# Patient Record
Sex: Female | Born: 1960
Health system: Southern US, Community
[De-identification: ages and names within clinical notes are randomized; demographics above are authoritative.]

## PROBLEM LIST (undated history)

## (undated) DIAGNOSIS — I4891 Unspecified atrial fibrillation: Secondary | ICD-10-CM

## (undated) DIAGNOSIS — M26609 Unspecified temporomandibular joint disorder, unspecified side: Secondary | ICD-10-CM

## (undated) DIAGNOSIS — A048 Other specified bacterial intestinal infections: Secondary | ICD-10-CM

## (undated) DIAGNOSIS — D649 Anemia, unspecified: Secondary | ICD-10-CM

## (undated) DIAGNOSIS — IMO0001 Reserved for inherently not codable concepts without codable children: Secondary | ICD-10-CM

## (undated) DIAGNOSIS — R87619 Unspecified abnormal cytological findings in specimens from cervix uteri: Secondary | ICD-10-CM

## (undated) DIAGNOSIS — L409 Psoriasis, unspecified: Secondary | ICD-10-CM

## (undated) DIAGNOSIS — N939 Abnormal uterine and vaginal bleeding, unspecified: Secondary | ICD-10-CM

## (undated) DIAGNOSIS — K219 Gastro-esophageal reflux disease without esophagitis: Secondary | ICD-10-CM

## (undated) DIAGNOSIS — R011 Cardiac murmur, unspecified: Secondary | ICD-10-CM

## (undated) DIAGNOSIS — T7840XA Allergy, unspecified, initial encounter: Secondary | ICD-10-CM

## (undated) DIAGNOSIS — E119 Type 2 diabetes mellitus without complications: Secondary | ICD-10-CM

## (undated) DIAGNOSIS — H539 Unspecified visual disturbance: Secondary | ICD-10-CM

## (undated) DIAGNOSIS — G4730 Sleep apnea, unspecified: Secondary | ICD-10-CM

## (undated) DIAGNOSIS — D219 Benign neoplasm of connective and other soft tissue, unspecified: Secondary | ICD-10-CM

## (undated) DIAGNOSIS — M1990 Unspecified osteoarthritis, unspecified site: Secondary | ICD-10-CM

## (undated) HISTORY — DX: Unspecified temporomandibular joint disorder, unspecified side: M26.609

## (undated) HISTORY — DX: Abnormal uterine and vaginal bleeding, unspecified: N93.9

## (undated) HISTORY — DX: Unspecified abnormal cytological findings in specimens from cervix uteri: R87.619

## (undated) HISTORY — DX: Anemia, unspecified: D64.9

## (undated) HISTORY — DX: Gastro-esophageal reflux disease without esophagitis: K21.9

## (undated) HISTORY — PX: OTHER SURGICAL HISTORY: SHX169

## (undated) HISTORY — DX: Psoriasis, unspecified: L40.9

## (undated) HISTORY — DX: Other specified bacterial intestinal infections: A04.8

## (undated) HISTORY — DX: Benign neoplasm of connective and other soft tissue, unspecified: D21.9

## (undated) HISTORY — PX: DILATION AND CURETTAGE OF UTERUS: SHX78

## (undated) HISTORY — DX: Allergy, unspecified, initial encounter: T78.40XA

## (undated) HISTORY — DX: Unspecified osteoarthritis, unspecified site: M19.90

## (undated) HISTORY — DX: Reserved for inherently not codable concepts without codable children: IMO0001

## (undated) HISTORY — DX: Sleep apnea, unspecified: G47.30

## (undated) HISTORY — DX: Type 2 diabetes mellitus without complications: E11.9

## (undated) HISTORY — PX: LEEP: SHX91

## (undated) HISTORY — DX: Cardiac murmur, unspecified: R01.1

## (undated) HISTORY — DX: Unspecified visual disturbance: H53.9

---

## 1987-03-09 HISTORY — PX: DILATION AND CURETTAGE, DIAGNOSTIC / THERAPEUTIC: SUR384

## 1992-03-08 DIAGNOSIS — A64 Unspecified sexually transmitted disease: Secondary | ICD-10-CM

## 1992-03-08 HISTORY — DX: Unspecified sexually transmitted disease: A64

## 1999-04-02 ENCOUNTER — Encounter: Payer: Self-pay | Admitting: Family Medicine

## 1999-04-02 ENCOUNTER — Ambulatory Visit (HOSPITAL_COMMUNITY): Admission: RE | Admit: 1999-04-02 | Discharge: 1999-04-02 | Payer: Self-pay | Admitting: Family Medicine

## 1999-08-06 ENCOUNTER — Other Ambulatory Visit: Admission: RE | Admit: 1999-08-06 | Discharge: 1999-08-06 | Payer: Self-pay | Admitting: Obstetrics & Gynecology

## 2000-12-29 ENCOUNTER — Encounter: Admission: RE | Admit: 2000-12-29 | Discharge: 2001-01-19 | Payer: Self-pay | Admitting: Family Medicine

## 2002-05-10 ENCOUNTER — Encounter: Payer: Self-pay | Admitting: Family Medicine

## 2002-05-10 ENCOUNTER — Encounter: Admission: RE | Admit: 2002-05-10 | Discharge: 2002-05-10 | Payer: Self-pay | Admitting: Family Medicine

## 2002-07-05 ENCOUNTER — Other Ambulatory Visit: Admission: RE | Admit: 2002-07-05 | Discharge: 2002-07-05 | Payer: Self-pay | Admitting: Otolaryngology

## 2002-07-12 ENCOUNTER — Encounter (HOSPITAL_COMMUNITY): Admission: RE | Admit: 2002-07-12 | Discharge: 2002-08-11 | Payer: Self-pay | Admitting: Otolaryngology

## 2004-08-20 ENCOUNTER — Ambulatory Visit (HOSPITAL_COMMUNITY): Admission: RE | Admit: 2004-08-20 | Discharge: 2004-08-20 | Payer: Self-pay | Admitting: Family Medicine

## 2006-10-20 ENCOUNTER — Ambulatory Visit (HOSPITAL_COMMUNITY): Admission: RE | Admit: 2006-10-20 | Discharge: 2006-10-20 | Payer: Self-pay | Admitting: Family Medicine

## 2009-01-14 ENCOUNTER — Ambulatory Visit (HOSPITAL_COMMUNITY): Admission: RE | Admit: 2009-01-14 | Discharge: 2009-01-14 | Payer: Self-pay | Admitting: Family Medicine

## 2010-05-16 LAB — HEPATIC FUNCTION PANEL: Bilirubin, Total: 0.4 mg/dL

## 2010-05-16 LAB — CBC AND DIFFERENTIAL
HCT: 35 % — AB (ref 36–46)
Hemoglobin: 11.2 g/dL — AB (ref 12.0–16.0)

## 2010-05-16 LAB — BASIC METABOLIC PANEL
Creatinine: 0.7 mg/dL (ref 0.5–1.1)
Glucose: 122 mg/dL
Potassium: 3.9 mmol/L (ref 3.4–5.3)
Sodium: 138 mmol/L (ref 137–147)

## 2010-05-18 DIAGNOSIS — R072 Precordial pain: Secondary | ICD-10-CM

## 2010-07-24 NOTE — Consult Note (Signed)
NAME:  Maria Winters, Maria Winters                       ACCOUNT NO.:  [id number]   MEDICAL RECORD NO.:  [id number]                   PATIENT TYPE:   LOCATION:                                       FACILITY:   PHYSICIAN:  Aundra Dubin, M.D.            DATE OF BIRTH:  09-Jul-1960   DATE OF CONSULTATION:  DATE OF DISCHARGE:                                   CONSULTATION   REFERRING PHYSICIAN:  Suzanna Obey, M.D.   CHIEF COMPLAINT:  Nasal perforation, positive ANA.   Dear Jonny Ruiz,   Thank you for this consultation.  Maria Winters is a 50 year old black female  who began having a sinus type of infection in December 2003.  There was no  fever associated with this.  She was having bleeding from her nose and would  blow her nose into a tissue paper but would also cough some up.  She was not  sure if some of the blood that was coughed up was from the lungs or now.  She has had a chest x-ray over the spring 2004 and she tells me it was  normal.   In December she was treated with a 10-day course of antibiotics. She was  having significant headaches, almost daily concerning this.  She later  required a 30-day course of antibiotics, but she still had terrible  headaches along with some of the hemoptysis and epistaxis.  For about 6  weeks from March into mid-April she used Nasacort. She finally developed a  perforation of the nasal septum in about mid-April.  She is not having the  bleeding from the nose, at this time, and her headaches have lessened.   Labs on 05/24/2002 showed an ANA 1:40, homogeneous, and an ESR was 60.  WBC  was 8.4, HGB 12.2 and platelets 442 (150-400).  She has now had a nasoseptal  biopsy which showed necrotic mucosa and inflammatory changes, but no  granulomas.   On further review of systems she has some generalized aching, and this is  worse to the low back particularly with standing.  There has been no swollen  joints.  There has been no weight loss.  There has been  moderate malaise  because she has not felt well for several months.  Her energy level is low,  but she describes this as being present for several years.  There have  been no active rashes, psoriasis, photosensitivity, oral ulcers, alopecia,  pleurisy, or Raynaud's.  She denies diarrhea, constipation, blood or mucus  to the BM.  There has been no shortness of breath or chest pain throughout  this process. She denies numbness, tingling or weakness to her muscles in  extremities.   PAST MEDICAL/SURGICAL HISTORY:  Pneumonia at age 52, D&C 53, ovarian  procedure 70.   MEDICATIONS:  1. Allegra 180 mg daily.  2. Advil a few pills per month.   DRUG INTOLERANCES:  NEXIUM--tremor.   FAMILY  HISTORY:  Her father is 79 years of age and has had recent surgery to  his back, but is in general good health.  He has HTN.  Her mother died at  age 4 from head trauma.  She had diabetes. She has had a brother to die in  an MVA.   SOCIAL HISTORY:  She has lived most of her life in [place], [place]. She is an  Geologist, engineering for [employer].  She completed 2 years of  training at [employer] and also 2 years of training at [employer] [facility].  She smoked about one-  third of a pack of cigarettes a day, stopping in 1995.  She has a glass of  wine about once per month.  She is single and lives alone. She does not have  children.   PHYSICAL EXAMINATION:  VITAL SIGNS:  Weight 269 pounds.  Height 5 feet 6  inches.  Blood pressure 130/70, respirations 16.  GENERAL:  She is significantly overweight but is in no distress.  SKIN:  There is no malar rash or nail fold dilatation.  HEENT:  Normal hair pattern.  PERL.  EOMI.  Examination of the nose readily  finds a full perforation of the nasal septum.  There is some redness and  some erythema around this.  There was no blood seen.  Mouth clear of blood,  ulcers, or petechiae.  NECK:  Palpable nontender thyroid.  LUNGS:  Clear.  HEART:  Regular.  No murmur.  ABDOMEN:   Obese, soft, nontender.  MUSCULOSKELETAL:  The hands, wrists, elbows, shoulder, neck, back, hips,  knees, ankles, and feet have a painless full range of motion and show no  active arthritis.  EXTREMITIES: The lower extremities have trace edema.  NEUROLOGIC:  Reflexes were suppressed and 1+ throughout. She had good  sensation to her feet and legs.  Strength to the proximal muscles was 5/5.  Negative SLRs.   ASSESSMENT/PLAN:  1. Nasal perforation.  As mentioned in Dr. Jearld Fenton' notes of concern would be     that Maria Winters was having a vasculitis. She has had some malaise and     overall aching, but no fever or weight loss.  She has a high     sedimentation rate but there is a great deal of inflammation going onto     the nose.  I will repeat some labs and check those for vasculitis. We     will check an ESR, CBC, CMET, urinalysis, and an ANCA profile.  She has     had a chest x-ray which was normal.  At the present time I do not     strongly sense that this is a vasculitis, but do await the results of     these labs.  2. Positive ANA.  This is quite low level, and is almost borderline     positive.  This is certainly not lupus with the lack of oral ulcers,     Raynaud's, swollen joints, pleuritic chest pain and skin changes.  3. Obesity.  4. Allergies.   Maria Winters certainly has a very significant nasal perforation as you well  know.  I do await the results of the labs; but, at this point, I do not  sense that this is a vasculitis.  She may need, eventually, to be treated  with steroids if surgical procedures are not an option.  I await the results  of the lab working; and, if it is all negative, then I  will not be  scheduling her back.  If, by chance, I am needed to help with treatment of  prednisone in terms of managing side  effects I would be glad to help with this.  Thank you for this consultation. We will be contacting her as we know the results of the above-mentioned lab   work.   Sincerely,                                               Aundra Dubin, M.D.    WWT/MEDQ  D:  07/12/2002  T:  07/12/2002  Job:  [id number]   cc:   Suzanna Obey, M.D.  [address] [place]  [place] [address]  Fax: [phone]

## 2010-08-04 ENCOUNTER — Ambulatory Visit (INDEPENDENT_AMBULATORY_CARE_PROVIDER_SITE_OTHER): Payer: BC Managed Care – PPO | Admitting: Internal Medicine

## 2010-08-24 ENCOUNTER — Ambulatory Visit (INDEPENDENT_AMBULATORY_CARE_PROVIDER_SITE_OTHER): Payer: BC Managed Care – PPO | Admitting: Internal Medicine

## 2010-10-06 ENCOUNTER — Encounter (INDEPENDENT_AMBULATORY_CARE_PROVIDER_SITE_OTHER): Payer: Self-pay

## 2010-11-02 ENCOUNTER — Ambulatory Visit (INDEPENDENT_AMBULATORY_CARE_PROVIDER_SITE_OTHER): Payer: BC Managed Care – PPO | Admitting: Internal Medicine

## 2010-11-02 ENCOUNTER — Telehealth (INDEPENDENT_AMBULATORY_CARE_PROVIDER_SITE_OTHER): Payer: Self-pay | Admitting: *Deleted

## 2010-11-02 ENCOUNTER — Encounter (INDEPENDENT_AMBULATORY_CARE_PROVIDER_SITE_OTHER): Payer: Self-pay | Admitting: Internal Medicine

## 2010-11-02 VITALS — BP 118/76 | HR 80 | Temp 97.8°F | Ht 66.0 in | Wt 281.0 lb

## 2010-11-02 DIAGNOSIS — D509 Iron deficiency anemia, unspecified: Secondary | ICD-10-CM

## 2010-11-02 DIAGNOSIS — R109 Unspecified abdominal pain: Secondary | ICD-10-CM

## 2010-11-02 MED ORDER — HYOSCYAMINE SULFATE 0.125 MG SL SUBL: 0.1250 mg | SUBLINGUAL_TABLET | Freq: Three times a day (TID) | SUBLINGUAL | Status: AC | PRN

## 2010-11-02 NOTE — Patient Instructions (Addendum)
EGD and colonoscopy to be scheduled. Stop iron 10 days before procedure. Levsin as directed.

## 2010-11-02 NOTE — Telephone Encounter (Signed)
TCS/EGD sch'd 12/01/10 @ 7:30 (6:30), movi prep instructions given

## 2010-11-03 MED ORDER — PEG-KCL-NACL-NASULF-NA ASC-C 100 G PO SOLR: 1.0000 | Freq: Once | ORAL | Status: DC

## 2010-11-03 NOTE — Telephone Encounter (Signed)
Addended by: Len Blalock on: 11/03/2010 09:41 AM   Modules accepted: Orders

## 2010-11-03 NOTE — Consult Note (Signed)
Maria Winters             ACCOUNT NO.:  [id number]  MEDICAL RECORD NO.:  [id number]  LOCATION:                                 FACILITY:  PHYSICIAN:  Lionel December, M.D.    DATE OF BIRTH:  1960/03/10  DATE OF CONSULTATION:  11/02/2010 DATE OF DISCHARGE:                                CONSULTATION   REASON FOR CONSULTATION:  Abdominal pain and iron deficiency anemia.  HISTORY OF PRESENT ILLNESS:  Maria Winters is a 50 year old African American female who is referred through courtesy of Dr. [name] for GI evaluation.  She was in usual state of health until May 15, 2010 when she developed chest pain described as tightness and not associated with any other symptoms.  She was evaluated in the emergency room at [facility] and hospitalized overnight.  Her lab studies revealed mild anemia with hemoglobin of 11.2 g.  Her cardiac enzymes were negative.  On an outpatient basis, she had x-rays tolerance test which was normal and while in the hospital she had upper abdominal ultrasound which was unremarkable.  She was advised by Dr. Doyne Keel to take Mylanta should her symptoms relapse.  She states she has 2 episodes of chest pain which would describe very mild and both responded to Mylanta and these occurred few weeks after first episode.  She was seen by Dr. Modesto Winters, and begun on iron pills.  Her hemoglobin was repeated and is up to 11.6 grams.  While she was in the hospital she also had H. pylori serology which was positive and she was treated with Prevpac for 2 weeks.  She has heartburn no more than once a week generally with spicy or rich foods.  She denies throat symptoms of dysphagia or odynophagia.  She states since she experience these symptoms she has changed her eating habits.  She has decreased intake of coffee and fatty foods.  She denies melena or rectal bleeding, diarrhea, or constipation.  She has been experiencing intermittent mid abdominal pain on either side  of umbilicus.  She describes this pain to be mild cramping associated with nausea worse with foods and may last for 30 minutes.  She believes Mylanta also helps at this pain.  She has not had any involuntary weight loss.  She has never been screened for colorectal carcinoma.  She states she was taking chromium picolinate and since she has stopped it 2 weeks ago her abdominal pain and intensity has decreased, but it has not gone away completely.  CURRENT MEDICATIONS: 1. Ferrous sulfate 325 mg one daily with food. 2. Ibuprofen 20 mg daily p.r.n. which she takes occasionally.  PAST MEDICAL HISTORY: 1. Obesity for the last 30 years. 2. She has been recently treated for H. pylori gastritis (March 2012).  ALLERGIES:  NEXIUM which resulted in tremors after two doses.  FAMILY HISTORY:  Both parents are diseased.  Father committed suicide at age 63 and mother was murdered at age 47.  She has two brothers and two sisters.  One brother has hypertension and gout.  Other brother has possibly a clotting disorder and is on Coumadin.  One sister is hypertensive.  SOCIAL HISTORY:  She is single.  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  She smoked cigarettes for 9 years less than pack a day, but quit in 1994.  She drinks alcohol very occasionally.  OBJECTIVE:  VITAL SIGNS:  Weight 281 pounds, she 66 inches tall, pulse 80 per minute, blood pressure 118/76, and respiratory rate 18. HEENT:  Conjunctivae is pink.  Sclera is nonicteric.  Oropharyngeal mucosa is normal.  Dentition in satisfactory condition. NECK:  No neck masses or thyromegaly noted. CARDIAC:  Regular rhythm.  Normal S1 and S2.  No murmur, rub, or gallop noted. LUNGS:  Clear to auscultation. ABDOMEN:  Protuberant.  Bowel sounds are normal.  She has mild periumbilical tenderness.  No organomegaly or masses. RECTAL:  Deferred. EXTREMITIES:  No peripheral edema or clubbing noted.  LABORATORY DATA:   From May 15, 2010, WBC 9.2, H and H 11.2 and 34.8, platelet count was 360 K and MCV was 81.8.  Her glucose was 122, BUN 11, creatinine 0.73, bilirubin 0.4, AP 77, AST 17, ALT 19, total protein 7.2 with albumin of 3.2.  Her hemoglobin 9 days ago was 11.6 grams.  ASSESSMENT:  Maria Winters is a 50 year old African American female who has been experiencing intermittent bilateral mid abdominal pain associated with nausea and exacerbated with food.  She was also found to have iron- deficiency anemia.  There is no history of melena or rectal bleeding.  Not mentioned above, she is still having periods and they are not particularly heavy.  She has been treated for H. pylori infection about 5 months ago.  It is possible that she may have peptic ulcer disease and it may have healed since she has been treated for H. pylori gastritis. Presuming this therapy has been successful.  Also need to rule out colonic neoplasm given her iron-deficiency anemia.  It is possible her noncardiac chest pain was secondary to GERD, although she does not have heartburn very frequently.  RECOMMENDATIONS: 1. Levsin SL t.i.d. p.r.n. prescription given for 60 with one refill. 2. Scheduled her for diagnostic esophagogastroduodenoscopy as well as     colonoscopy.  I have reviewed both the procedure risks with the     patient and she is agreeable.  The patient will need to stop her     iron 10 days before these exams.  We appreciate the opportunity to participate in the care of this nice lady.          ______________________________ Lionel December, M.D.     NR/MEDQ  D:  11/02/2010  T:  11/03/2010  Job:  [id number]  cc:   Maria Winters, M.D.

## 2010-11-05 NOTE — Progress Notes (Signed)
REASON FOR CONSULTATION: Abdominal pain and iron deficiency anemia.  HISTORY OF PRESENT ILLNESS: Maria Winters is a 50 year old African American  female who is referred through courtesy of Dr. [name] for  GI evaluation. She was in usual state of health until May 15, 2010  when she developed chest pain described as tightness and not associated  with any other symptoms. She was evaluated in the emergency room at [facility]  and hospitalized overnight. Her lab studies revealed mild anemia with  hemoglobin of 11.2 g. Her cardiac enzymes were negative. while in the hospital she had upper abdominal ultrasound which was  unremarkable. On an outpatient basis, she had ETT which was normal She was advised by Dr. Doyne Keel to take Mylanta should her  symptoms relapse. She states she has 2 episodes of chest pain which  would describe very mild and both responded to Mylanta and these  occurred few weeks after first episode. She was seen by Dr. Modesto Charon, and  begun on iron pills. Her hemoglobin was repeated and is up to 11.6  grams. While she was in the hospital she also had H. pylori serology  which was positive and she was treated with Prevpac for 2 weeks. She  has heartburn no more than once a week generally with spicy or rich  foods. She denies throat symptoms of dysphagia or odynophagia. She  states since she experience these symptoms she has changed her eating  habits. She has decreased intake of coffee and fatty foods. She denies  melena or rectal bleeding, diarrhea, or constipation. She has been  experiencing intermittent mid abdominal pain on either side of  umbilicus. She describes this pain to be mild cramping associated with  nausea worse with foods and may last for 30 minutes. She believes  Mylanta also helps at this pain. She has not had any involuntary weight  loss. She has never been screened for colorectal carcinoma. She states  she was taking chromium picolinate and since she has stopped it  2 weeks  ago her abdominal pain and intensity has decreased, but it has not gone  away completely.  CURRENT MEDICATIONS:  1. Ferrous sulfate 325 mg one daily with food.  2. Ibuprofen 20 mg daily p.r.n. which she takes occasionally.  PAST MEDICAL HISTORY:  1. Obesity for the last 30 years.  2. She has been recently treated for H. pylori gastritis (March 2012).  ALLERGIES: NEXIUM resulted in tremors after two doses.  FAMILY HISTORY: Both parents are diseased. Father committed suicide at  age 50 and mother was murdered at age 10. She has two brothers and two  sisters. One brother has hypertension and gout. Other brother has  possibly a clotting disorder and is on Coumadin. One sister is  hypertensive.  SOCIAL HISTORY: She is single. 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 She smoked cigarettes for 9 years less  than pack a day, but quit in 1994. She drinks alcohol very  occasionally.  OBJECTIVE: VITAL SIGNS: Weight 281 pounds, she 66 inches tall, pulse  80 per minute, blood pressure 118/76, and respiratory rate 18.  HEENT: Conjunctivae is pink. Sclera is nonicteric. Oropharyngeal  mucosa is normal. Dentition in satisfactory condition.  NECK: No neck masses or thyromegaly noted.  CARDIAC: Regular rhythm. Normal S1 and S2. No murmur, rub, or gallop  noted.  LUNGS: Clear to auscultation.  ABDOMEN: Protuberant. Bowel sounds are normal. She has mild  periumbilical tenderness. No organomegaly or masses.  RECTAL: Deferred.  EXTREMITIES: No peripheral edema or clubbing noted.  LABORATORY DATA: From May 15, 2010, WBC 9.2, H and H 11.2 and 34.8,  platelet count was 360 K and MCV was 81.8. Her glucose was 122, BUN 11,  creatinine 0.73, bilirubin 0.4, AP 77, AST 17, ALT 19, total protein 7.2  with albumin of 3.2.  Her hemoglobin 9 days ago was 11.6 grams.  ASSESSMENT: Maria Winters is a 51 year old African American female who has  been experiencing intermittent  bilateral mid abdominal pain associated  with nausea and exacerbated with food. She was also found to have iron-  deficiency anemia. There is no history of melena, rectal bleeding. Not  mentioned above, she is still having periods and they are not  particularly having. She has been treated for H. pylori infection about  5 months ago. It is possible that she may have peptic ulcer disease.  This may have healed since she has been treated for H. pylori gastritis.  Presuming this therapy has been successful. Also need to rule out  colonic neoplasm given her iron-deficiency anemia. It is possible her  noncardiac chest pain was secondary to GERD, although she does not have  heartburn very frequently.  RECOMMENDATIONS:  1. Levsin SL t.i.d. p.r.n. prescription given for 60 with one refill.  2. Diagnostic esophagogastroduodenoscopy as well as  colonoscopy. I have reviewed both the procedure risks with the  patient and she is agreeable. The patient will need to stop her  iron 10 days before these exams.  We appreciate the opportunity to participate in the care of this nice  lady.

## 2010-11-06 ENCOUNTER — Encounter (INDEPENDENT_AMBULATORY_CARE_PROVIDER_SITE_OTHER): Payer: Self-pay | Admitting: *Deleted

## 2010-12-01 ENCOUNTER — Encounter (HOSPITAL_COMMUNITY): Payer: Self-pay | Admitting: *Deleted

## 2010-12-01 ENCOUNTER — Encounter (HOSPITAL_COMMUNITY)
Admission: RE | Disposition: A | Discharge status: Home / Self Care | Payer: Self-pay | Source: Ambulatory Visit | Attending: Internal Medicine

## 2010-12-01 ENCOUNTER — Ambulatory Visit (HOSPITAL_COMMUNITY)
Admission: RE | Admit: 2010-12-01 | Discharge: 2010-12-01 | Disposition: A | Discharge status: Home / Self Care | Payer: BC Managed Care – PPO | Source: Ambulatory Visit | Attending: Internal Medicine | Admitting: Internal Medicine

## 2010-12-01 DIAGNOSIS — D509 Iron deficiency anemia, unspecified: Secondary | ICD-10-CM | POA: Insufficient documentation

## 2010-12-01 DIAGNOSIS — R109 Unspecified abdominal pain: Secondary | ICD-10-CM

## 2010-12-01 DIAGNOSIS — K449 Diaphragmatic hernia without obstruction or gangrene: Secondary | ICD-10-CM

## 2010-12-01 SURGERY — COLONOSCOPY WITH ESOPHAGOGASTRODUODENOSCOPY (EGD)
Anesthesia: Moderate Sedation

## 2010-12-01 MED ORDER — BUTAMBEN-TETRACAINE-BENZOCAINE 2-2-14 % EX AERO
INHALATION_SPRAY | CUTANEOUS | Status: DC | PRN
  Administered 2010-12-01: 2 via TOPICAL

## 2010-12-01 MED ORDER — MIDAZOLAM HCL 5 MG/5ML IJ SOLN
INTRAMUSCULAR | Status: DC | PRN
  Administered 2010-12-01: 1 mg via INTRAVENOUS
  Administered 2010-12-01 (×3): 2 mg via INTRAVENOUS

## 2010-12-01 MED ORDER — MEPERIDINE HCL 50 MG/ML IJ SOLN
INTRAMUSCULAR | Status: AC
  Filled 2010-12-01: qty 1

## 2010-12-01 MED ORDER — SODIUM CHLORIDE 0.45 % IV SOLN
Freq: Once | INTRAVENOUS | Status: AC
  Administered 2010-12-01: 08:00:00 via INTRAVENOUS

## 2010-12-01 MED ORDER — STERILE WATER FOR IRRIGATION IR SOLN
Status: DC | PRN
  Administered 2010-12-01: 08:00:00

## 2010-12-01 MED ORDER — MIDAZOLAM HCL 5 MG/5ML IJ SOLN
INTRAMUSCULAR | Status: AC
  Filled 2010-12-01: qty 10

## 2010-12-01 MED ORDER — MEPERIDINE HCL 50 MG/ML IJ SOLN
INTRAMUSCULAR | Status: DC | PRN
  Administered 2010-12-01 (×2): 25 mg via INTRAVENOUS

## 2010-12-01 NOTE — Op Note (Signed)
EGD and COLONOSCOPY  PROCEDURE REPORT  PATIENT:  Maria Winters  MR#:  [id number] Birthdate:  May 20, 1960, 50 y.o., female Endoscopist:  Dr. Malissa Hippo, MD Referred By:  Dr. Ileana Ladd, MD Procedure Date: 12/01/2010  Procedure:   EGD & Colonoscopy  Indications:  Patient is 50 year old Afro-American female with iron deficiency anemia I recommended abdominal pain. This patient's office visit a few weeks ago her abdominal pain has resolved. She is undergoing diagnostic evaluation.            Informed Consent:  Both the procedures and risks were reviewed with the patient and informed consent was obtained Medications:  Demerol 50 mg IV Versed 7 mg IV Cetacaine spray topically for oropharyngeal anesthesia  EGD  Description of procedure:  The endoscope was introduced through the mouth and advanced to the second portion of the duodenum without difficulty or limitations. The mucosal surfaces were surveyed very carefully during advancement of the scope and upon withdrawal.  Findings:  Esophagus:  Normal esophageal mucosa GEJ:  36 cm Hiatus:  38 cm Stomach:  Stomach was empty and distended very well with insufflation. Folds in the proximal stomach were normal. Examination of mucosa at body, antrum, pyloric channel, angularis, fundus and cardia was also normal. Duodenum:  Bulbar mucosa was normal. Folds in the post bulbar duodenum were also normal close attention was paid to mucosal texture and it was normal.  Therapeutic/Diagnostic Maneuvers Performed:  None  COLONOSCOPY Description of procedure:  After a digital rectal exam was performed, that colonoscope was advanced from the anus through the rectum and colon to the area of the cecum, ileocecal valve and appendiceal orifice. The cecum was deeply intubated. These structures were well-seen and photographed for the record. From the level of the cecum and ileocecal valve, the scope was slowly and cautiously withdrawn. The mucosal  surfaces were carefully surveyed utilizing scope tip to flexion to facilitate fold flattening as needed. The scope was pulled down into the rectum where a thorough exam including retroflexion was performed. TI was also examined  Findings:   Excellent prep. Normal terminal ileum. Normal mucosa of the colon throughout. Normal rectal mucosa and anorectal region.   Therapeutic/Diagnostic Maneuvers Performed:  None  Complications:  None  Cecal Withdrawal Time:  8 minutes  Impression:  Small sliding hiatal hernia otherwise normal esophagogastroduodenoscopy. Normal terminal ileum. Normal colonoscopy. Suspect iron deficiency anemia may be secondary to uterine blood loss. Unless she is documented to have occult or overt GI bleed he does not need further workup i.e. given capsule study   Recommendations:  Patient will resume ferrous sulfate 325 mg twice daily with meals. She'll have H&H repeated in 8 weeks.    [name] U  12/01/2010 8:24 AM  CC: Dr. [name], MD, MD & Dr. Bonnetta Barry ref. provider found

## 2010-12-01 NOTE — OR Nursing (Signed)
Dr. Karilyn Cota notified of patient being late on her period. Patient states she has been abstinent since 1994. Dr. Karilyn Cota said no need for a pregnancy test.

## 2010-12-01 NOTE — H&P (Signed)
This is an update to history and physical from 11/02/2010. She states her abdominal pain has resolved and she did not have use Levsin. She has been off ferrous sulfate for 2 weeks. She is undergoing diagnostic EGD and a colonoscopy for iron deficiency anemia.

## 2012-03-02 ENCOUNTER — Encounter (INDEPENDENT_AMBULATORY_CARE_PROVIDER_SITE_OTHER): Payer: Self-pay

## 2012-04-28 ENCOUNTER — Other Ambulatory Visit (INDEPENDENT_AMBULATORY_CARE_PROVIDER_SITE_OTHER): Payer: Self-pay | Admitting: *Deleted

## 2012-04-28 ENCOUNTER — Ambulatory Visit (INDEPENDENT_AMBULATORY_CARE_PROVIDER_SITE_OTHER): Payer: 59 | Admitting: Internal Medicine

## 2012-04-28 ENCOUNTER — Encounter (INDEPENDENT_AMBULATORY_CARE_PROVIDER_SITE_OTHER): Payer: Self-pay | Admitting: *Deleted

## 2012-04-28 ENCOUNTER — Encounter (INDEPENDENT_AMBULATORY_CARE_PROVIDER_SITE_OTHER): Payer: Self-pay | Admitting: Internal Medicine

## 2012-04-28 VITALS — BP 100/65 | HR 76 | Ht 66.5 in | Wt 267.7 lb

## 2012-04-28 DIAGNOSIS — K219 Gastro-esophageal reflux disease without esophagitis: Secondary | ICD-10-CM

## 2012-04-28 DIAGNOSIS — R079 Chest pain, unspecified: Secondary | ICD-10-CM

## 2012-04-28 NOTE — Progress Notes (Signed)
Subjective:     Patient ID: Maria Winters, female   DOB: [birth year], 52 y.o.   MRN: [id number]  HPIReferred to our office by Hospitalist services at [facility] [place], PQ,  Flossie Dibble). She recently was admitted to [facility] with chest pain. 2 weeks ago. She had a negative cardiac work.  It was felt her pain was from GERD. She felt she was having an MI.  Her pain last from 430pm till 730pm. Her symptoms resolved with a GI cocktail. She is still getting slight chest pain when she eats. She tells me she is having chest pains every other day. She is eating in small portions. No fried foods. Appetite is good. She has had some weight loss which was intentional. She was admitted to [facility] in March of 2012 with same type of symptoms. Found to be H. Pylori positive and treated with Prevpac. She underwent an EGD/Colonoscopy in September of 2012 for anemia, chest pain. Please seen below. BMs are normal. No melena or bright red rectal bleeding. She does not take ASA or NSAIDs on a regular basis.  Patient has a period every other month.  12/01/2010 EGD/Colonoscopy for BMW:[id number]:  Small sliding hiatal hernia otherwise normal esophagogastroduodenoscopy.  Normal terminal ileum.  Normal colonoscopy.  Suspect iron deficiency anemia may be secondary to uterine blood loss. Unless she is documented to have occult or overt GI bleed he does not need further workup i.e. given capsule study      04/18/2012 NA 137, K 3.3, BUN 7. Creatinine 0.65 H and H 11.4 and 37.0, Platelet ct 371.   Review of Systems see hpi Current Outpatient Prescriptions  Medication Sig Dispense Refill  . cholecalciferol (VITAMIN D) 400 UNITS TABS Take 1,000 Units by mouth 2 (two) times daily before a meal.      . glucosamine-chondroitin 500-400 MG tablet Take 1 tablet by mouth 4 (four) times daily.      . pantoprazole (PROTONIX) 40 MG tablet Take 40 mg by mouth daily.      . ferrous sulfate 325 (65 FE) MG tablet Take 650 mg by  mouth daily with breakfast. OTC        No current facility-administered medications for this visit.   Past Medical History  Diagnosis Date  . Helicobacter pylori (H. pylori)   . GERD (gastroesophageal reflux disease)   . Reflux   . Chest pain   . Anemia    Past Surgical History  Procedure Laterality Date  . Dilation and curettage, diagnostic / therapeutic  1989    with Conization  . Leep    . Cervix dilated     Allergies  Allergen Reactions  . Esomeprazole Magnesium     Patient states that the Nexium caused tremors        Objective:   Physical Exam  Filed Vitals:   04/28/12 0914  BP: 100/65  Pulse: 76  Height: 5' 6.5" (1.689 m)  Weight: 267 lb 11.2 oz (121.428 kg)  Alert and oriented. Skin warm and dry. Oral mucosa is moist.   . Sclera anicteric, conjunctivae is pink. Thyroid not enlarged. No cervical lymphadenopathy. Lungs clear. Heart regular rate and rhythm.  Abdomen is soft. Bowel sounds are positive. No hepatomegaly. No abdominal masses felt. Abdomen obeseNo tenderness.  No edema to lower extremities. Patient is alert and oriented.      Assessment:    Atypical chest pain, possible GERD. Hx of H. Pylori. I discussed with Dr. Karilyn Cota  Plan:    EGD

## 2012-04-28 NOTE — Patient Instructions (Signed)
EGD with Dr. Rehman. 

## 2012-05-05 ENCOUNTER — Encounter (HOSPITAL_COMMUNITY): Payer: Self-pay | Admitting: Pharmacy Technician

## 2012-05-11 ENCOUNTER — Ambulatory Visit (HOSPITAL_COMMUNITY)
Admission: RE | Admit: 2012-05-11 | Discharge: 2012-05-11 | Disposition: A | Discharge status: Home / Self Care | Payer: 59 | Source: Ambulatory Visit | Attending: Internal Medicine | Admitting: Internal Medicine

## 2012-05-11 ENCOUNTER — Encounter (HOSPITAL_COMMUNITY): Payer: Self-pay | Admitting: *Deleted

## 2012-05-11 ENCOUNTER — Encounter (HOSPITAL_COMMUNITY)
Admission: RE | Disposition: A | Discharge status: Home / Self Care | Payer: Self-pay | Source: Ambulatory Visit | Attending: Internal Medicine

## 2012-05-11 DIAGNOSIS — R079 Chest pain, unspecified: Secondary | ICD-10-CM

## 2012-05-11 DIAGNOSIS — K219 Gastro-esophageal reflux disease without esophagitis: Secondary | ICD-10-CM | POA: Insufficient documentation

## 2012-05-11 DIAGNOSIS — K449 Diaphragmatic hernia without obstruction or gangrene: Secondary | ICD-10-CM | POA: Insufficient documentation

## 2012-05-11 HISTORY — PX: ESOPHAGOGASTRODUODENOSCOPY: SHX5428

## 2012-05-11 SURGERY — EGD (ESOPHAGOGASTRODUODENOSCOPY)
Anesthesia: Moderate Sedation

## 2012-05-11 MED ORDER — MIDAZOLAM HCL 5 MG/5ML IJ SOLN
INTRAMUSCULAR | Status: AC
  Filled 2012-05-11: qty 10

## 2012-05-11 MED ORDER — SODIUM CHLORIDE 0.45 % IV SOLN
INTRAVENOUS | Status: DC
  Administered 2012-05-11: 13:00:00 via INTRAVENOUS

## 2012-05-11 MED ORDER — BUTAMBEN-TETRACAINE-BENZOCAINE 2-2-14 % EX AERO
INHALATION_SPRAY | CUTANEOUS | Status: DC | PRN
  Administered 2012-05-11: 2 via TOPICAL

## 2012-05-11 MED ORDER — MEPERIDINE HCL 25 MG/ML IJ SOLN
INTRAMUSCULAR | Status: DC | PRN
  Administered 2012-05-11 (×2): 25 mg via INTRAVENOUS

## 2012-05-11 MED ORDER — PANTOPRAZOLE SODIUM 40 MG PO TBEC: 40.0000 mg | DELAYED_RELEASE_TABLET | Freq: Two times a day (BID) | ORAL | Status: DC

## 2012-05-11 MED ORDER — MEPERIDINE HCL 50 MG/ML IJ SOLN
INTRAMUSCULAR | Status: AC
  Filled 2012-05-11: qty 1

## 2012-05-11 MED ORDER — MIDAZOLAM HCL 5 MG/5ML IJ SOLN
INTRAMUSCULAR | Status: DC | PRN
  Administered 2012-05-11 (×2): 2 mg via INTRAVENOUS
  Administered 2012-05-11: 1 mg via INTRAVENOUS

## 2012-05-11 NOTE — H&P (Signed)
Maria Winters is an 52 y.o. female.   Chief Complaint: Patient is here for diagnostic EGD. HPI: Patient is 52 year old African female who has history of chest pain. A year and half she was found to have H. pylori gastritis and treated. She also has chronic GERD and maintenance PPI. She is admitted to [facility] in [place] about 3 weeks ago chest pain and ruled out for myocardial infarction he again started on a meal. She denies nausea vomiting or melena. She has good appetite. She did have negative gallbladder ultrasound of 18 months ago. She denies dysphagia.  Past Medical History  Diagnosis Date  . Helicobacter pylori (H. pylori)   . GERD (gastroesophageal reflux disease)   . Reflux   . Chest pain   . Anemia     Past Surgical History  Procedure Laterality Date  . Dilation and curettage, diagnostic / therapeutic  1989    with Conization  . Leep    . Cervix dilated      Family History  Problem Relation Age of Onset  . Diabetes Mother   . Hypertension Father   . Healthy Sister   . Hypertension Brother   . Gout Brother   . Hypertension Sister   . Sickle cell trait Brother    Social History:  reports that she quit smoking about 19 years ago. Her smoking use included Cigarettes. She has a 2.5 pack-year smoking history. She has never used smokeless tobacco. She reports that  drinks alcohol. She reports that she does not use illicit drugs.  Allergies:  Allergies  Allergen Reactions  . Esomeprazole Magnesium     Patient states that the Nexium caused tremors    Medications Prior to Admission  Medication Sig Dispense Refill  . Cholecalciferol (VITAMIN D) 2000 UNITS CAPS Take 2,000 Units by mouth daily.      Marland Kitchen glucosamine-chondroitin 500-400 MG tablet Take 1 tablet by mouth 4 (four) times daily.      . pantoprazole (PROTONIX) 40 MG tablet Take 40 mg by mouth 2 (two) times daily.         No results found for this or any previous visit (from the past 48 hour(s)). No results  found.  ROS  Blood pressure 160/93, pulse 73, temperature 98.4 F (36.9 C), temperature source Oral, resp. rate 20, height 5\' 6"  (1.676 m), weight 267 lb (121.11 kg), last menstrual period 04/13/2012, SpO2 100.00%. Physical Exam  Constitutional: She appears well-developed and well-nourished.  HENT:  Mouth/Throat: Oropharynx is clear and moist.  Eyes: Conjunctivae are normal. No scleral icterus.  Neck: No thyromegaly present.  Cardiovascular: Normal rate, regular rhythm and normal heart sounds.   No murmur heard. Respiratory: Effort normal and breath sounds normal.  GI: Soft. She exhibits no distension and no mass. There is no tenderness.  Musculoskeletal: She exhibits no edema.  Lymphadenopathy:    She has no cervical adenopathy.  Neurological: She is alert.  Skin: Skin is warm and dry.     Assessment/Plan Atypical chest pain. History of GERD. Diagnostic EGD.  Koray Soter U 05/11/2012, 1:31 PM

## 2012-05-11 NOTE — Op Note (Addendum)
EGD PROCEDURE REPORT  PATIENT:  Maria Winters  MR#:  [id number] Birthdate:  09-27-1960, 52 y.o., female Endoscopist:  Dr. Malissa Hippo, MD Referred By:  Dr. [name], MD Procedure Date: 05/11/2012  Procedure:   EGD  Indications:  Patient is 52 year old African female with history of GERD  Whose symptoms been well controlled with PPI. She was admitted to [facility] with chest pain about 3 weeks ago and ruled out for myocardial infarction. Patient denies abdominal pain nausea or vomiting. She did have ultrasound over a year ago and was negative for cholelithiasis. He is undergoing diagnostic EGD.           Informed Consent:  The risks, benefits, alternatives & imponderables which include, but are not limited to, bleeding, infection, perforation, drug reaction and potential missed lesion have been reviewed.  The potential for biopsy, lesion removal, esophageal dilation, etc. have also been discussed.  Questions have been answered.  All parties agreeable.  Please see history & physical in medical record for more information.  Medications:  Demerol 50 mg IV Versed 5 mg IV Cetacaine spray topically for oropharyngeal anesthesia  Description of procedure:  The endoscope was introduced through the mouth and advanced to the second portion of the duodenum without difficulty or limitations. The mucosal surfaces were surveyed very carefully during advancement of the scope and upon withdrawal.  Findings:  Esophagus:  Mucosa of the esophagus was normal. Serrated or wavy GE junction. Biopsy was taken to rule out short segment Barrett's. GEJ:  35 cm Hiatus:  38 cm Stomach:  Stomach was empty and distended very well with insufflation. Folds in the proximal stomach were normal. Examination of mucosa at body and antrum was normal. Erythema noted to mucosa of pyloric channel without erosions or stenosis. Angularis, fundus and cardia were examined by retroflexing the scope and were normal. Duodenum:   Normal bulbar and post bulbar mucosa.  Therapeutic/Diagnostic Maneuvers Performed:   Biopsy taken from GE junction as above. Biopsy also taken from antral/prepyloric mucosa for CLO test(please note patient has been off PPI for few days).  Complications:  None  Impression: Serrated or wavy GE junction without erosive esophagitis. Biopsy taken to rule out short segment Barrett,s. Small sliding hiatal hernia. Erythema to mucosa of pyloric channel but no evidence of peptic ulcer disease. Gastric biopsy taken for CLOtest.  Recommendations:  New prescription for pantoprazole 40 mg by mouth twice a day given for 60 doses along with 5 refills. I will be contacting patient with results of biopsy and CLOtest. If she experiences another episode of postprandial chest pain will consider evaluation for biliary tract disease.  REHMAN,NAJEEB U  05/11/2012  2:00 PM  CC: Dr. [name], MD & Dr. Bonnetta Barry ref. provider found

## 2012-05-16 ENCOUNTER — Encounter (HOSPITAL_COMMUNITY): Payer: Self-pay | Admitting: Internal Medicine

## 2012-05-17 ENCOUNTER — Encounter (INDEPENDENT_AMBULATORY_CARE_PROVIDER_SITE_OTHER): Payer: Self-pay | Admitting: *Deleted

## 2012-05-17 ENCOUNTER — Other Ambulatory Visit (INDEPENDENT_AMBULATORY_CARE_PROVIDER_SITE_OTHER): Payer: Self-pay | Admitting: Internal Medicine

## 2012-05-17 DIAGNOSIS — K219 Gastro-esophageal reflux disease without esophagitis: Secondary | ICD-10-CM

## 2012-05-17 DIAGNOSIS — R0789 Other chest pain: Secondary | ICD-10-CM

## 2012-05-23 ENCOUNTER — Ambulatory Visit (HOSPITAL_COMMUNITY): Payer: 59

## 2012-05-23 ENCOUNTER — Encounter (INDEPENDENT_AMBULATORY_CARE_PROVIDER_SITE_OTHER): Payer: Self-pay | Admitting: Internal Medicine

## 2012-05-23 DIAGNOSIS — R0789 Other chest pain: Secondary | ICD-10-CM

## 2012-05-23 NOTE — Progress Notes (Signed)
This encounter was created in error - please disregard.

## 2012-05-29 ENCOUNTER — Ambulatory Visit (HOSPITAL_COMMUNITY)
Admission: RE | Admit: 2012-05-29 | Discharge: 2012-05-29 | Disposition: A | Discharge status: Home / Self Care | Payer: 59 | Source: Ambulatory Visit | Attending: Internal Medicine | Admitting: Internal Medicine

## 2012-05-29 DIAGNOSIS — R0789 Other chest pain: Secondary | ICD-10-CM | POA: Insufficient documentation

## 2012-05-29 DIAGNOSIS — K7689 Other specified diseases of liver: Secondary | ICD-10-CM | POA: Insufficient documentation

## 2012-05-29 DIAGNOSIS — K8020 Calculus of gallbladder without cholecystitis without obstruction: Secondary | ICD-10-CM | POA: Insufficient documentation

## 2012-05-29 DIAGNOSIS — K219 Gastro-esophageal reflux disease without esophagitis: Secondary | ICD-10-CM | POA: Insufficient documentation

## 2012-05-31 ENCOUNTER — Encounter (INDEPENDENT_AMBULATORY_CARE_PROVIDER_SITE_OTHER): Payer: Self-pay

## 2012-06-08 ENCOUNTER — Ambulatory Visit: Payer: Self-pay | Admitting: Family Medicine

## 2012-06-15 ENCOUNTER — Encounter (INDEPENDENT_AMBULATORY_CARE_PROVIDER_SITE_OTHER): Payer: Self-pay | Admitting: Surgery

## 2012-06-15 ENCOUNTER — Ambulatory Visit (INDEPENDENT_AMBULATORY_CARE_PROVIDER_SITE_OTHER): Payer: 59 | Admitting: Surgery

## 2012-06-15 VITALS — BP 132/84 | HR 72 | Temp 98.5°F | Resp 18 | Ht 66.5 in | Wt 373.0 lb

## 2012-06-15 DIAGNOSIS — K8020 Calculus of gallbladder without cholecystitis without obstruction: Secondary | ICD-10-CM

## 2012-06-15 NOTE — Progress Notes (Signed)
Chief Complaint:  Gallstones and obesity  History of Present Illness:  Maria Winters is an 52 y.o. female who works for [employer].  She has been having reduced hours and has been getting stressed.  She had a bout of chest pain leading to an emergency room visit and a subsequent ultrasound had any pin showing a 1.8 cm gallstone. She also was found to have hepatic steatosis.  I talked to her at length about assisting with weight loss because of the hepatic steatosis. I also told her that her gallstone may been there for a long time.  I gave her a booklet on laparoscopic cholecystectomy. I think after we had discussed this at length she would like to manage this expectantly and see sheeting along the right without having a cholecystectomy. I discussed the rationale for surgery, the risk and the benefits. For the moment we will plan to observe her.  Past Medical History  Diagnosis Date  . Helicobacter pylori (H. pylori)   . GERD (gastroesophageal reflux disease)   . Reflux   . Chest pain   . Anemia     Past Surgical History  Procedure Laterality Date  . Dilation and curettage, diagnostic / therapeutic  1989    with Conization  . Leep    . Cervix dilated    . Esophagogastroduodenoscopy N/A 05/11/2012    Procedure: ESOPHAGOGASTRODUODENOSCOPY (EGD);  Surgeon: Malissa Hippo, MD;  [facility];  Service: Endoscopy;  Laterality: N/A;  125    Current Outpatient Prescriptions  Medication Sig Dispense Refill  . Cholecalciferol (VITAMIN D) 2000 UNITS CAPS Take 2,000 Units by mouth daily.      . pantoprazole (PROTONIX) 40 MG tablet Take 1 tablet (40 mg total) by mouth 2 (two) times daily before a meal.  60 tablet  5  . glucosamine-chondroitin 500-400 MG tablet Take 1 tablet by mouth 4 (four) times daily.       No current facility-administered medications for this visit.   Esomeprazole magnesium Family History  Problem Relation Age of Onset  . Diabetes Mother   .  Hypertension Father   . Healthy Sister   . Hypertension Brother   . Gout Brother   . Hypertension Sister   . Sickle cell trait Brother    Social History:   reports that she quit smoking about 20 years ago. Her smoking use included Cigarettes. She has a 2.5 pack-year smoking history. She has never used smokeless tobacco. She reports that  drinks alcohol. She reports that she does not use illicit drugs.   REVIEW OF SYSTEMS - PERTINENT POSITIVES ONLY: Noncontributory except brothers are positive for sickle cell trait  Physical Exam:   Blood pressure 132/84, pulse 72, temperature 98.5 F (36.9 C), resp. rate 18, height 5' 6.5" (1.689 m), weight 373 lb (169.192 kg). Body mass index is 59.31 kg/(m^2).  Gen:  WDWN AAF NAD  Neurological: Alert and oriented to person, place, and time. Motor and sensory function is grossly intact  Head: Normocephalic and atraumatic.  Eyes: Conjunctivae are normal. Pupils are equal, round, and reactive to light. No scleral icterus.  Neck: Normal range of motion. Neck supple. No tracheal deviation or thyromegaly present.  Cardiovascular:  SR without murmurs or gallops.  No carotid bruits Respiratory: Effort normal.  No respiratory distress. No chest wall tenderness. Breath sounds normal.  No wheezes, rales or rhonchi.  Abdomen:  obese GU: Musculoskeletal: Normal range of motion. Extremities are nontender. No cyanosis, edema or clubbing  noted Lymphadenopathy: No cervical, preauricular, postauricular or axillary adenopathy is present Skin: Skin is warm and dry. No rash noted. No diaphoresis. No erythema. No pallor. Pscyh: Normal mood and affect. Behavior is normal. Judgment and thought content normal.   LABORATORY RESULTS: No results found for this or any previous visit (from the past 48 hour(s)).  RADIOLOGY RESULTS: No results found.  Problem List: Patient Active Problem List  Diagnosis  . Chest pain, unspecified  . GERD (gastroesophageal reflux disease)     Assessment & Plan: Minimally symptomatic gallstone.  Will move forward with lap chole is she gets more symptomatic    Matt B. Daphine Deutscher, MD, [facility] [phone] beeper [phone]  06/15/2012 12:02 PM

## 2012-06-15 NOTE — Patient Instructions (Addendum)

## 2012-06-28 ENCOUNTER — Telehealth (INDEPENDENT_AMBULATORY_CARE_PROVIDER_SITE_OTHER): Payer: Self-pay | Admitting: *Deleted

## 2012-06-28 NOTE — Telephone Encounter (Signed)
[name] had lost our number and is now returning Dr. Patty Sermons call. She went to see Dr. Baxter Kail and was told she has 1.8 cm gallstones. [name] doesn't want to have her gallbladder removed and would like to know if [name] or Dr. Karilyn Cota knew of someone that could zap the stones to move them? The return phone number is [phone] or [phone].

## 2012-06-29 ENCOUNTER — Telehealth (INDEPENDENT_AMBULATORY_CARE_PROVIDER_SITE_OTHER): Payer: Self-pay

## 2012-06-29 NOTE — Telephone Encounter (Signed)
LMOM letting the pt know that I was returning her call.  I did not see anywhere that I could leave a detailed message - so I just asked that she call the office back.  (She is inquiring about options other than Sx for her gallstones.... Per MM there is no medications that will help this.  The only suggestion he has is a strict/bland diet - no fried foods, greasy foods, fatty foods, etc.)

## 2012-06-30 NOTE — Telephone Encounter (Signed)
Forwared to Dr.Rehman for review

## 2012-07-03 ENCOUNTER — Ambulatory Visit: Payer: Self-pay | Admitting: Nurse Practitioner

## 2012-07-03 NOTE — Telephone Encounter (Signed)
Zapping does not work; She can wait if she wants to until symptoms worse; downside would be urgent surgery if she develops acute cholecystitis Please call patient.

## 2012-07-04 NOTE — Telephone Encounter (Signed)
Patient called and made aware of Dr.Rehman's recommendations. This was left on her voice mail (Cell). I ask that she call me back.

## 2012-08-04 ENCOUNTER — Ambulatory Visit: Payer: Self-pay | Admitting: Family Medicine

## 2012-08-25 NOTE — Progress Notes (Signed)
Surgery scheduled for 09/13/12.  Need orders in EPIC.  Thank You.  

## 2012-08-31 NOTE — Progress Notes (Signed)
Need orders in EPIC.  Surgery scheduled for 09/13/12.  Thank You.  

## 2012-09-01 NOTE — Progress Notes (Signed)
Pt coming for preop Thurs 09/07/12 - need orders when able please - thank you

## 2012-09-05 ENCOUNTER — Encounter (HOSPITAL_COMMUNITY): Payer: Self-pay | Admitting: Pharmacy Technician

## 2012-09-07 ENCOUNTER — Other Ambulatory Visit (HOSPITAL_COMMUNITY): Payer: Self-pay | Admitting: *Deleted

## 2012-09-07 ENCOUNTER — Encounter (HOSPITAL_COMMUNITY): Payer: Self-pay

## 2012-09-07 ENCOUNTER — Encounter (HOSPITAL_COMMUNITY)
Admission: RE | Admit: 2012-09-07 | Discharge: 2012-09-07 | Disposition: A | Discharge status: Home / Self Care | Payer: 59 | Source: Ambulatory Visit | Attending: Surgery | Admitting: Surgery

## 2012-09-07 ENCOUNTER — Ambulatory Visit (HOSPITAL_COMMUNITY)
Admission: RE | Admit: 2012-09-07 | Discharge: 2012-09-07 | Disposition: A | Discharge status: Home / Self Care | Payer: 59 | Source: Ambulatory Visit | Attending: Surgery | Admitting: Surgery

## 2012-09-07 DIAGNOSIS — K8020 Calculus of gallbladder without cholecystitis without obstruction: Secondary | ICD-10-CM | POA: Insufficient documentation

## 2012-09-07 DIAGNOSIS — Z01818 Encounter for other preprocedural examination: Secondary | ICD-10-CM | POA: Insufficient documentation

## 2012-09-07 LAB — HCG, SERUM, QUALITATIVE: Preg, Serum: NEGATIVE

## 2012-09-07 LAB — CBC
HCT: 35.8 % — ABNORMAL LOW (ref 36.0–46.0)
MCV: 81.9 fL (ref 78.0–100.0)
Platelets: 327 10*3/uL (ref 150–400)
RBC: 4.37 MIL/uL (ref 3.87–5.11)
RDW: 14 % (ref 11.5–15.5)
WBC: 7.9 10*3/uL (ref 4.0–10.5)

## 2012-09-07 NOTE — Progress Notes (Addendum)
EKG from [facility] 04/18/2012, ! View chest 04/18/2012,  Stress test /Echo from [facility] 05/17/2012 on chart. Jehovah's Witness refusal of blood form on chart. Labs-CBC, Coagulation, Chemistry from [facility] on chart from 04/18/2012.

## 2012-09-07 NOTE — Patient Instructions (Signed)
20      Your procedure is scheduled on:  Wednesday 09/13/2012  Report to [facility] at 0800  AM.  Call this number if you have problems the morning of surgery: [phone]   Remember:             IF YOU USE CPAP,BRING MASK AND TUBING AM OF SURGERY!   Do not eat food or drink liquids AFTER MIDNIGHT!  Take these medicines the morning of surgery with A SIP OF WATER: NONE   Do not bring valuables to the hospital. [facility] IS NOT RESPONSIBLE  FOR ANY BELONGINGS OR VALUABLES.  Wynelle Fanny suitcase in the car. After surgery it may be brought to your room.  For patients admitted to the hospital, checkout time is 11:00 AM the day of              Discharge.    DO NOT WEAR JEWELRY , MAKE-UP, LOTIONS,POWDERS,PERFUMES!             WOMEN -DO NOT SHAVE LEGS OR UNDERARMS 12 HRS. BEFORE  SURGERY!               MEN MAY SHAVE AS USUAL!             CONTACTS,DENTURES OR BRIDGEWORK, FALSE EYELASHES MAY  NOT BE WORN INTO SURGERY!                                           Patients discharged the day of surgery will not be allowed to drive home. If going home the same day of surgery, must have someone stay with you first 24 hrs.at home and arrange for someone to drive you home from the Hospital.                          YOUR DRIVER IS:   Special Instructions:             Please read over the following fact sheets that you were given:             1. [place] PREPARING FOR SURGERY SHEET              2.[facility]SA INFORMATION              3.[facility]                                        Telford Nab.Lyniah Fujita,RN,BSN     [phone]                FAILURE TO FOLLOW THESE INSTRUCTIONS MAY RESULT IN  CANCELLATION OF YOUR SURGERY!               Patient Signature:___________________________

## 2012-09-11 ENCOUNTER — Other Ambulatory Visit (INDEPENDENT_AMBULATORY_CARE_PROVIDER_SITE_OTHER): Payer: Self-pay | Admitting: Surgery

## 2012-09-11 NOTE — H&P (Signed)
Chief Complaint: Gallstones and obesity  History of Present Illness: Maria Winters is an 52 y.o. female who works for [employer]. She has been having reduced hours and has been getting stressed. She had a bout of chest pain leading to an emergency room visit and a subsequent ultrasound at [facility]  showing a 1.8 cm gallstone. She also was found to have hepatic steatosis.  I talked to her at length about assisting with weight loss because of the hepatic steatosis. I also told her that her gallstone may been there for a long time. I gave her a booklet on laparoscopic cholecystectomy. I think after we had discussed this at length she would like to manage this expectantly and see how she gets along  without having a cholecystectomy. I discussed the rationale for surgery, the risk and the benefits.   She has changed her mind and and proceeded to move forward with surgery Past Medical History   Diagnosis  Date   .  Helicobacter pylori (H. pylori)    .  GERD (gastroesophageal reflux disease)    .  Reflux    .  Chest pain    .  Anemia     Past Surgical History   Procedure  Laterality  Date   .  Dilation and curettage, diagnostic / therapeutic   1989     with Conization   .  Leep     .  Cervix dilated     .  Esophagogastroduodenoscopy  N/A  05/11/2012     Procedure: ESOPHAGOGASTRODUODENOSCOPY (EGD); Surgeon: Malissa Hippo, MD; Location[facility]; Service: Endoscopy; Laterality: N/A; 125    Current Outpatient Prescriptions   Medication  Sig  Dispense  Refill   .  Cholecalciferol (VITAMIN D) 2000 UNITS CAPS  Take 2,000 Units by mouth daily.     .  pantoprazole (PROTONIX) 40 MG tablet  Take 1 tablet (40 mg total) by mouth 2 (two) times daily before a meal.  60 tablet  5   .  glucosamine-chondroitin 500-400 MG tablet  Take 1 tablet by mouth 4 (four) times daily.      No current facility-administered medications for this visit.   Esomeprazole magnesium  Family History    Problem  Relation  Age of Onset   .  Diabetes  Mother    .  Hypertension  Father    .  Healthy  Sister    .  Hypertension  Brother    .  Gout  Brother    .  Hypertension  Sister    .  Sickle cell trait  Brother    Social History: reports that she quit smoking about 20 years ago. Her smoking use included Cigarettes. She has a 2.5 pack-year smoking history. She has never used smokeless tobacco. She reports that drinks alcohol. She reports that she does not use illicit drugs.  REVIEW OF SYSTEMS - PERTINENT POSITIVES ONLY:  Noncontributory except brothers are positive for sickle cell trait  Physical Exam:  Blood pressure 132/84, pulse 72, temperature 98.5 F (36.9 C), resp. rate 18, height 5' 6.5" (1.689 m), weight 373 lb (169.192 kg).  Body mass index is 59.31 kg/(m^2).  Gen: WDWN AAF NAD  Neurological: Alert and oriented to person, place, and time. Motor and sensory function is grossly intact  Head: Normocephalic and atraumatic.  Eyes: Conjunctivae are normal. Pupils are equal, round, and reactive to light. No scleral icterus.  Neck: Normal range of motion. Neck supple.  No tracheal deviation or thyromegaly present.  Cardiovascular: SR without murmurs or gallops. No carotid bruits  Respiratory: Effort normal. No respiratory distress. No chest wall tenderness. Breath sounds normal. No wheezes, rales or rhonchi.  Abdomen: obese  GU:  Musculoskeletal: Normal range of motion. Extremities are nontender. No cyanosis, edema or clubbing noted Lymphadenopathy: No cervical, preauricular, postauricular or axillary adenopathy is present Skin: Skin is warm and dry. No rash noted. No diaphoresis. No erythema. No pallor. Pscyh: Normal mood and affect. Behavior is normal. Judgment and thought content normal.  LABORATORY RESULTS:  No results found for this or any previous visit (from the past 48 hour(s)).  RADIOLOGY RESULTS:  No results found.  Problem List:  Patient Active Problem List   Diagnosis    .  Chest pain, unspecified   .  GERD (gastroesophageal reflux disease)   Assessment & Plan:  Minimally symptomatic gallstone. Will move forward with lap chole.    Matt B. Daphine Deutscher, MD, [facility]  [phone] beeper  [phone]

## 2012-09-13 ENCOUNTER — Ambulatory Visit (HOSPITAL_COMMUNITY): Payer: 59

## 2012-09-13 ENCOUNTER — Observation Stay (HOSPITAL_COMMUNITY)
Admission: RE | Admit: 2012-09-13 | Discharge: 2012-09-14 | Disposition: A | Discharge status: Home / Self Care | Payer: 59 | Source: Ambulatory Visit | Attending: Surgery | Admitting: Surgery

## 2012-09-13 ENCOUNTER — Encounter (HOSPITAL_COMMUNITY)
Admission: RE | Disposition: A | Discharge status: Home / Self Care | Payer: Self-pay | Source: Ambulatory Visit | Attending: Surgery

## 2012-09-13 ENCOUNTER — Ambulatory Visit (HOSPITAL_COMMUNITY): Payer: 59 | Admitting: Registered Nurse

## 2012-09-13 ENCOUNTER — Encounter (HOSPITAL_COMMUNITY): Payer: Self-pay | Admitting: Registered Nurse

## 2012-09-13 ENCOUNTER — Encounter (HOSPITAL_COMMUNITY): Payer: Self-pay | Admitting: *Deleted

## 2012-09-13 DIAGNOSIS — K219 Gastro-esophageal reflux disease without esophagitis: Secondary | ICD-10-CM | POA: Insufficient documentation

## 2012-09-13 DIAGNOSIS — Z79899 Other long term (current) drug therapy: Secondary | ICD-10-CM | POA: Insufficient documentation

## 2012-09-13 DIAGNOSIS — K8010 Calculus of gallbladder with chronic cholecystitis without obstruction: Principal | ICD-10-CM | POA: Insufficient documentation

## 2012-09-13 DIAGNOSIS — Z9049 Acquired absence of other specified parts of digestive tract: Secondary | ICD-10-CM

## 2012-09-13 DIAGNOSIS — K824 Cholesterolosis of gallbladder: Secondary | ICD-10-CM

## 2012-09-13 DIAGNOSIS — K8020 Calculus of gallbladder without cholecystitis without obstruction: Secondary | ICD-10-CM

## 2012-09-13 DIAGNOSIS — E669 Obesity, unspecified: Secondary | ICD-10-CM | POA: Insufficient documentation

## 2012-09-13 HISTORY — PX: CHOLECYSTECTOMY: SHX55

## 2012-09-13 LAB — CBC
Hemoglobin: 11.9 g/dL — ABNORMAL LOW (ref 12.0–15.0)
RBC: 4.52 MIL/uL (ref 3.87–5.11)
WBC: 12.2 10*3/uL — ABNORMAL HIGH (ref 4.0–10.5)

## 2012-09-13 LAB — CREATININE, SERUM
GFR calc Af Amer: >90 mL/min (ref >90)
GFR calc non Af Amer: >90 mL/min (ref >90)

## 2012-09-13 SURGERY — LAPAROSCOPIC CHOLECYSTECTOMY WITH INTRAOPERATIVE CHOLANGIOGRAM
Anesthesia: General | Site: Abdomen | Wound class: Clean Contaminated

## 2012-09-13 MED ORDER — HYDROMORPHONE HCL PF 1 MG/ML IJ SOLN: 0.2500 mg | INTRAMUSCULAR | Status: DC | PRN

## 2012-09-13 MED ORDER — PROPOFOL 10 MG/ML IV BOLUS
INTRAVENOUS | Status: DC | PRN
  Administered 2012-09-13: 200 mg via INTRAVENOUS

## 2012-09-13 MED ORDER — GLYCOPYRROLATE 0.2 MG/ML IJ SOLN
INTRAMUSCULAR | Status: DC | PRN
  Administered 2012-09-13: .8 mg via INTRAVENOUS

## 2012-09-13 MED ORDER — OXYCODONE HCL 5 MG/5ML PO SOLN
5.0000 mg | Freq: Once | ORAL | Status: DC | PRN
  Filled 2012-09-13: qty 5

## 2012-09-13 MED ORDER — BUPIVACAINE LIPOSOME 1.3 % IJ SUSP
INTRAMUSCULAR | Status: DC | PRN
  Administered 2012-09-13: 20 mL

## 2012-09-13 MED ORDER — BUPIVACAINE LIPOSOME 1.3 % IJ SUSP
20.0000 mL | Freq: Once | INTRAMUSCULAR | Status: DC
  Filled 2012-09-13: qty 20

## 2012-09-13 MED ORDER — ACETAMINOPHEN 325 MG PO TABS
650.0000 mg | ORAL_TABLET | ORAL | Status: DC | PRN
  Administered 2012-09-13 – 2012-09-14 (×2): 650 mg via ORAL
  Filled 2012-09-13 (×2): qty 2

## 2012-09-13 MED ORDER — DEXTROSE 5 % IV SOLN
3.0000 g | INTRAVENOUS | Status: AC
  Administered 2012-09-13: 3 g via INTRAVENOUS

## 2012-09-13 MED ORDER — PROMETHAZINE HCL 25 MG/ML IJ SOLN: 6.2500 mg | INTRAMUSCULAR | Status: DC | PRN

## 2012-09-13 MED ORDER — ONDANSETRON HCL 4 MG/2ML IJ SOLN: 4.0000 mg | Freq: Four times a day (QID) | INTRAMUSCULAR | Status: DC | PRN

## 2012-09-13 MED ORDER — LACTATED RINGERS IR SOLN
Status: DC | PRN
  Administered 2012-09-13: 1000 mL

## 2012-09-13 MED ORDER — HEPARIN SODIUM (PORCINE) 5000 UNIT/ML IJ SOLN
5000.0000 [IU] | Freq: Three times a day (TID) | INTRAMUSCULAR | Status: DC
  Administered 2012-09-13 – 2012-09-14 (×2): 5000 [IU] via SUBCUTANEOUS
  Filled 2012-09-13 (×5): qty 1

## 2012-09-13 MED ORDER — MIDAZOLAM HCL 5 MG/5ML IJ SOLN
INTRAMUSCULAR | Status: DC | PRN
  Administered 2012-09-13: 2 mg via INTRAVENOUS

## 2012-09-13 MED ORDER — LACTATED RINGERS IV SOLN
INTRAVENOUS | Status: DC | PRN
  Administered 2012-09-13: 10:00:00 via INTRAVENOUS

## 2012-09-13 MED ORDER — CEFAZOLIN SODIUM 1-5 GM-% IV SOLN
INTRAVENOUS | Status: AC
  Filled 2012-09-13: qty 50

## 2012-09-13 MED ORDER — SODIUM CHLORIDE 0.9 % IR SOLN
Status: DC | PRN
  Administered 2012-09-13: 1000 mL

## 2012-09-13 MED ORDER — ONDANSETRON HCL 4 MG/2ML IJ SOLN
INTRAMUSCULAR | Status: DC | PRN
  Administered 2012-09-13: 4 mg via INTRAVENOUS

## 2012-09-13 MED ORDER — HEPARIN SODIUM (PORCINE) 5000 UNIT/ML IJ SOLN
5000.0000 [IU] | Freq: Once | INTRAMUSCULAR | Status: AC
  Administered 2012-09-13: 5000 [IU] via SUBCUTANEOUS
  Filled 2012-09-13: qty 1

## 2012-09-13 MED ORDER — KCL IN DEXTROSE-NACL 20-5-0.45 MEQ/L-%-% IV SOLN
INTRAVENOUS | Status: DC
  Administered 2012-09-13 – 2012-09-14 (×2): via INTRAVENOUS
  Filled 2012-09-13 (×5): qty 1000

## 2012-09-13 MED ORDER — DEXAMETHASONE SODIUM PHOSPHATE 10 MG/ML IJ SOLN
INTRAMUSCULAR | Status: DC | PRN
  Administered 2012-09-13: 10 mg via INTRAVENOUS

## 2012-09-13 MED ORDER — IOHEXOL 300 MG/ML  SOLN
INTRAMUSCULAR | Status: DC | PRN
  Administered 2012-09-13: 15 mL

## 2012-09-13 MED ORDER — IOHEXOL 300 MG/ML  SOLN
INTRAMUSCULAR | Status: AC
  Filled 2012-09-13: qty 1

## 2012-09-13 MED ORDER — ROCURONIUM BROMIDE 100 MG/10ML IV SOLN
INTRAVENOUS | Status: DC | PRN
  Administered 2012-09-13: 10 mg via INTRAVENOUS
  Administered 2012-09-13: 40 mg via INTRAVENOUS

## 2012-09-13 MED ORDER — LIDOCAINE HCL (CARDIAC) 20 MG/ML IV SOLN
INTRAVENOUS | Status: DC | PRN
  Administered 2012-09-13: 100 mg via INTRAVENOUS

## 2012-09-13 MED ORDER — ACETAMINOPHEN 10 MG/ML IV SOLN
1000.0000 mg | Freq: Once | INTRAVENOUS | Status: DC | PRN
  Filled 2012-09-13: qty 100

## 2012-09-13 MED ORDER — MEPERIDINE HCL 50 MG/ML IJ SOLN: 6.2500 mg | INTRAMUSCULAR | Status: DC | PRN

## 2012-09-13 MED ORDER — SUCCINYLCHOLINE CHLORIDE 20 MG/ML IJ SOLN
INTRAMUSCULAR | Status: DC | PRN
  Administered 2012-09-13: 100 mg via INTRAVENOUS

## 2012-09-13 MED ORDER — MORPHINE SULFATE 2 MG/ML IJ SOLN: 1.0000 mg | INTRAMUSCULAR | Status: DC | PRN

## 2012-09-13 MED ORDER — CHLORHEXIDINE GLUCONATE 4 % EX LIQD: 1.0000 "application " | Freq: Once | CUTANEOUS | Status: DC

## 2012-09-13 MED ORDER — ONDANSETRON HCL 4 MG PO TABS: 4.0000 mg | ORAL_TABLET | Freq: Four times a day (QID) | ORAL | Status: DC | PRN

## 2012-09-13 MED ORDER — FENTANYL CITRATE 0.05 MG/ML IJ SOLN
INTRAMUSCULAR | Status: DC | PRN
  Administered 2012-09-13 (×2): 50 ug via INTRAVENOUS
  Administered 2012-09-13: 100 ug via INTRAVENOUS
  Administered 2012-09-13: 50 ug via INTRAVENOUS

## 2012-09-13 MED ORDER — CEFAZOLIN SODIUM-DEXTROSE 2-3 GM-% IV SOLR
INTRAVENOUS | Status: AC
  Filled 2012-09-13: qty 50

## 2012-09-13 MED ORDER — NEOSTIGMINE METHYLSULFATE 1 MG/ML IJ SOLN
INTRAMUSCULAR | Status: DC | PRN
  Administered 2012-09-13: 5 mg via INTRAVENOUS

## 2012-09-13 MED ORDER — BUPIVACAINE-EPINEPHRINE (PF) 0.5% -1:200000 IJ SOLN
INTRAMUSCULAR | Status: AC
  Filled 2012-09-13: qty 10

## 2012-09-13 MED ORDER — OXYCODONE HCL 5 MG PO TABS: 5.0000 mg | ORAL_TABLET | Freq: Once | ORAL | Status: DC | PRN

## 2012-09-13 SURGICAL SUPPLY — 43 items
APL SKNCLS STERI-STRIP NONHPOA (GAUZE/BANDAGES/DRESSINGS) ×1
APPLIER CLIP 5 13 M/L LIGAMAX5 (MISCELLANEOUS)
APPLIER CLIP ROT 10 11.4 M/L (STAPLE)
APR CLP MED LRG 11.4X10 (STAPLE)
APR CLP MED LRG 5 ANG JAW (MISCELLANEOUS)
BAG SPEC RTRVL LRG 6X4 10 (ENDOMECHANICALS) ×1
BENZOIN TINCTURE PRP APPL 2/3 (GAUZE/BANDAGES/DRESSINGS) ×2 IMPLANT
CABLE HIGH FREQUENCY MONO STRZ (ELECTRODE) IMPLANT
CANISTER SUCTION 2500CC (MISCELLANEOUS) ×2 IMPLANT
CATH REDDICK CHOLANGI 4FR 50CM (CATHETERS) IMPLANT
CLIP APPLIE 5 13 M/L LIGAMAX5 (MISCELLANEOUS) IMPLANT
CLIP APPLIE ROT 10 11.4 M/L (STAPLE) IMPLANT
CLOTH BEACON ORANGE TIMEOUT ST (SAFETY) ×2 IMPLANT
COVER MAYO STAND STRL (DRAPES) ×2 IMPLANT
COVER SURGICAL LIGHT HANDLE (MISCELLANEOUS) ×2 IMPLANT
DECANTER SPIKE VIAL GLASS SM (MISCELLANEOUS) ×2 IMPLANT
DRAPE C-ARM 42X120 X-RAY (DRAPES) ×2 IMPLANT
DRAPE LAPAROSCOPIC ABDOMINAL (DRAPES) ×2 IMPLANT
ELECT REM PT RETURN 9FT ADLT (ELECTROSURGICAL) ×2
ELECTRODE REM PT RTRN 9FT ADLT (ELECTROSURGICAL) ×1 IMPLANT
GLOVE BIOGEL M 8.0 STRL (GLOVE) ×2 IMPLANT
GOWN STRL NON-REIN LRG LVL3 (GOWN DISPOSABLE) ×2 IMPLANT
GOWN STRL REIN XL XLG (GOWN DISPOSABLE) ×4 IMPLANT
HEMOSTAT SURGICEL 4X8 (HEMOSTASIS) IMPLANT
IV CATH 14GX2 1/4 (CATHETERS) ×2 IMPLANT
KIT BASIN OR (CUSTOM PROCEDURE TRAY) ×2 IMPLANT
NS IRRIG 1000ML POUR BTL (IV SOLUTION) ×2 IMPLANT
POUCH SPECIMEN RETRIEVAL 10MM (ENDOMECHANICALS) ×2 IMPLANT
SCISSORS LAP 5X35 DISP (ENDOMECHANICALS) ×2 IMPLANT
SET IRRIG TUBING LAPAROSCOPIC (IRRIGATION / IRRIGATOR) ×2 IMPLANT
SLEEVE Z-THREAD 5X100MM (TROCAR) IMPLANT
SOLUTION ANTI FOG 6CC (MISCELLANEOUS) ×2 IMPLANT
STRIP CLOSURE SKIN 1/2X4 (GAUZE/BANDAGES/DRESSINGS) ×2 IMPLANT
SUT VIC AB 4-0 SH 18 (SUTURE) ×2 IMPLANT
SYR 30ML LL (SYRINGE) ×2 IMPLANT
TOWEL OR 17X26 10 PK STRL BLUE (TOWEL DISPOSABLE) ×4 IMPLANT
TRAY LAP CHOLE (CUSTOM PROCEDURE TRAY) ×2 IMPLANT
TROCAR BLADELESS OPT 5 75 (ENDOMECHANICALS) ×4 IMPLANT
TROCAR XCEL BLUNT TIP 100MML (ENDOMECHANICALS) ×2 IMPLANT
TROCAR XCEL NON-BLD 11X100MML (ENDOMECHANICALS) IMPLANT
TROCAR Z-THREAD FIOS 11X100 BL (TROCAR) ×2 IMPLANT
TROCAR Z-THREAD FIOS 5X100MM (TROCAR) IMPLANT
TUBING INSUFFLATION 10FT LAP (TUBING) ×2 IMPLANT

## 2012-09-13 NOTE — Preoperative (Signed)
Beta Blockers   Reason not to administer Beta Blockers:Not Applicable 

## 2012-09-13 NOTE — Interval H&P Note (Signed)
History and Physical Interval Note:  09/13/2012 10:32 AM  Maria Winters  has presented today for surgery, with the diagnosis of gallstones  The various methods of treatment have been discussed with the patient and family. After consideration of risks, benefits and other options for treatment, the patient has consented to  Procedure(s): LAPAROSCOPIC CHOLECYSTECTOMY WITH INTRAOPERATIVE CHOLANGIOGRAM (N/A) as a surgical intervention .  The patient's history has been reviewed, patient examined, no change in status, stable for surgery.  I have reviewed the patient's chart and labs.  Questions were answered to the patient's satisfaction.     Aayra Hornbaker B

## 2012-09-13 NOTE — Anesthesia Preprocedure Evaluation (Addendum)
Anesthesia Evaluation  Patient identified by MRN, date of birth, ID band Patient awake    Reviewed: Allergy & Precautions, H&P , NPO status , Patient's Chart, lab work & pertinent test results  Airway Mallampati: II TM Distance: >3 FB Neck ROM: Full    Dental  (+) Dental Advisory Given and Teeth Intact   Pulmonary neg pulmonary ROS,  breath sounds clear to auscultation        Cardiovascular negative cardio ROS  Rhythm:Regular Rate:Normal     Neuro/Psych negative neurological ROS  negative psych ROS   GI/Hepatic Neg liver ROS, GERD-  Medicated,  Endo/Other  negative endocrine ROSMorbid obesity  Renal/GU negative Renal ROS     Musculoskeletal negative musculoskeletal ROS (+)   Abdominal (+) + obese,   Peds  Hematology negative hematology ROS (+)   Anesthesia Other Findings   Reproductive/Obstetrics negative OB ROS                          Anesthesia Physical Anesthesia Plan  ASA: III  Anesthesia Plan: General   Post-op Pain Management:    Induction: Intravenous  Airway Management Planned: Oral ETT  Additional Equipment:   Intra-op Plan:   Post-operative Plan: Extubation in OR  Informed Consent: I have reviewed the patients History and Physical, chart, labs and discussed the procedure including the risks, benefits and alternatives for the proposed anesthesia with the patient or authorized representative who has indicated his/her understanding and acceptance.   Dental advisory given  Plan Discussed with: CRNA  Anesthesia Plan Comments:         Anesthesia Quick Evaluation

## 2012-09-13 NOTE — Anesthesia Postprocedure Evaluation (Signed)
Anesthesia Post Note  Patient: Maria Winters  Procedure(s) Performed: Procedure(s) (LRB): LAPAROSCOPIC CHOLECYSTECTOMY WITH INTRAOPERATIVE CHOLANGIOGRAM (N/A)  Anesthesia type: General  Patient location: PACU  Post pain: Pain level controlled  Post assessment: Post-op Vital signs reviewed  Last Vitals: BP 130/74  Pulse 68  Temp(Src) 36.7 C (Oral)  Resp 18  Ht 5' 6.5" (1.689 m)  Wt 288 lb 12.8 oz (131 kg)  BMI 45.92 kg/m2  SpO2 99%  LMP 07/19/2012  Post vital signs: Reviewed  Level of consciousness: sedated  Complications: No apparent anesthesia complications

## 2012-09-13 NOTE — Op Note (Signed)
Maria Winters @date @  Procedure: Laparoscopic Cholecystectomy with intraoperative cholangiogram  Surgeon: Wenda Low, MD, FACS Asst:  Romie Levee, MD  Anes:  General  Drains: None  Findings: Chronic cholecystitis with 1.5 cm stone; normal IOC  Description of Procedure: The patient was taken to OR 11 and given general anesthesia.  The patient was prepped with PCMX and draped sterilely. A time out was performed.  Access to the abdomen was achieved with a 5 mm Hassan with balloon.  Port placement included a 11 mm in the upper midline and 3 other 5 mm trocars .    The gallbladder was visualized and the fundus was grasped and the gallbladder was elevated. Traction on the infundibulum allowed for successful demonstration of the critical view. Inflammatory changes were chronic and significant in Calot's triangle .  The cystic duct was identified and clipped up on the gallbladder and an incision was made in the cystic duct and the Reddick catheter was inserted after milking the cystic duct of any debris. A dynamic cholangiogram was performed which demonstrated small common duct and free flow intra hepatically and into the duodenum..    The cystic duct was then triple clipped and divided, the cystic artery was double clipped and divided and then the gallbladder was removed from the gallbladder bed. Removal of the gallbladder from the gallbladder bed was straightforward.  The gallbladder was then placed in a bag and brought out through one of the 10 mm trocar sites. The gallbladder bed was inspected and no bleeding or bile leaks were seen.   Laparoscopic visualization was used when closing fascial defects for trocar sites.   Incisions were injected with Exparel and closed with 4-0 Vicryl and Dermabond on the skin.  Sponge and needle count were correct.    The patient was taken to the recovery room in satisfactory condition.

## 2012-09-13 NOTE — Transfer of Care (Signed)
Immediate Anesthesia Transfer of Care Note  Patient: Maria Winters  Procedure(s) Performed: Procedure(s): LAPAROSCOPIC CHOLECYSTECTOMY WITH INTRAOPERATIVE CHOLANGIOGRAM (N/A)  Patient Location: PACU  Anesthesia Type:General  Level of Consciousness: awake, alert , oriented and patient cooperative  Airway & Oxygen Therapy: Patient Spontanous Breathing and Patient connected to face mask oxygen  Post-op Assessment: Report given to PACU RN, Post -op Vital signs reviewed and stable and Patient moving all extremities  Post vital signs: Reviewed and stable  Complications: No apparent anesthesia complications

## 2012-09-14 ENCOUNTER — Encounter (HOSPITAL_COMMUNITY): Payer: Self-pay | Admitting: Surgery

## 2012-09-14 DIAGNOSIS — Z9049 Acquired absence of other specified parts of digestive tract: Secondary | ICD-10-CM

## 2012-09-14 LAB — COMPREHENSIVE METABOLIC PANEL
ALT: 28 U/L (ref 0–35)
Alkaline Phosphatase: 91 U/L (ref 39–117)
CO2: 30 mEq/L (ref 19–32)
GFR calc Af Amer: >90 mL/min (ref >90)
GFR calc non Af Amer: >90 mL/min (ref >90)
Glucose, Bld: 181 mg/dL — ABNORMAL HIGH (ref 70–99)
Potassium: 4.2 mEq/L (ref 3.5–5.1)
Sodium: 139 mEq/L (ref 135–145)
Total Protein: 6.8 g/dL (ref 6.0–8.3)

## 2012-09-14 LAB — CBC
HCT: 35.2 % — ABNORMAL LOW (ref 36.0–46.0)
MCHC: 32.1 g/dL (ref 30.0–36.0)
RDW: 14.2 % (ref 11.5–15.5)

## 2012-09-14 MED ORDER — HYDROCODONE-ACETAMINOPHEN 5-325 MG PO TABS: 1.0000 | ORAL_TABLET | ORAL | Status: DC | PRN

## 2012-09-14 MED ORDER — IBUPROFEN 600 MG PO TABS
600.0000 mg | ORAL_TABLET | Freq: Once | ORAL | Status: AC
  Administered 2012-09-14: 600 mg via ORAL
  Filled 2012-09-14: qty 1

## 2012-09-14 NOTE — Care Management Note (Signed)
    Page 1 of 1   09/14/2012     11:50:30 AM   CARE MANAGEMENT NOTE 09/14/2012  Patient:  Maria Winters, Maria Winters   Account Number:  [id number]  Date Initiated:  09/14/2012  Documentation initiated by:  Lorenda Ishihara  Subjective/Objective Assessment:   52 yo female admitted s/p lap chole. PTA lived at home alone.     Action/Plan:   Home when stable   Anticipated DC Date:  09/14/2012   Anticipated DC Plan:  HOME/SELF CARE      DC Planning Services  CM consult      Choice offered to / List presented to:             Status of service:  Completed, signed off Medicare Important Message given?   (If response is "NO", the following Medicare IM given date fields will be blank) Date Medicare IM given:   Date Additional Medicare IM given:    Discharge Disposition:  HOME/SELF CARE  Per UR Regulation:  Reviewed for med. necessity/level of care/duration of stay  If discussed at Long Length of Stay Meetings, dates discussed:    Comments:

## 2012-09-14 NOTE — Discharge Summary (Signed)
Physician Discharge Summary  Patient ID: Maria Winters MRN: [id number] DOB/AGE: 1960-11-01 52 y.o.  Admit date: 09/13/2012 Discharge date: 09/14/2012  Admission Diagnoses:  [facility]  Discharge Diagnoses:  Chronic cholecystitis  Active Problems:   S/P lap cholecystectomy July 2014   Surgery:  Lap chole IOC   Discharged Condition: improved  Hospital Course:   Had surgery.  Kept overnight for observation  Consults: none  Significant Diagnostic Studies: IOC    Discharge Exam: Blood pressure 126/52, pulse 60, temperature 97.9 F (36.6 C), temperature source Oral, resp. rate 16, height 5' 6.5" (1.689 m), weight 288 lb 12.8 oz (131 kg), last menstrual period 07/19/2012, SpO2 97.00%. Abdomen is sore as expected  Disposition: 01-Home or Self Care  Discharge Orders   Future Orders Complete By Expires     Diet - low sodium heart healthy  As directed     Discharge instructions  As directed     Comments:      May shower Return to work when you feel that you can    Increase activity slowly  As directed     No wound care  As directed         Medication List         cholecalciferol 1000 UNITS tablet  Commonly known as:  VITAMIN D  Take 1,000 Units by mouth daily. 2 daily     HYDROcodone-acetaminophen 5-325 MG per tablet  Commonly known as:  NORCO/VICODIN  Take 1 tablet by mouth every 4 (four) hours as needed for pain.           Follow-up Information   Follow up with Luretha Murphy B, MD In 4 weeks.   Contact information:   [address] [place] [address] [phone]       Signed: Valarie Merino 09/14/2012, 8:23 AM

## 2012-09-14 NOTE — Progress Notes (Signed)
Assessment unchanged. Pt verbalized understanding of dc instructions through teach back, including when to follow up with MD post op. Introduced to My Chart and understands how to sign up. Scripts x 1 given as provided by MD. Discharged via wc to front entrance to meet friend and awaiting vehicle to carry home. Accompanied by NT.

## 2012-10-12 ENCOUNTER — Encounter (INDEPENDENT_AMBULATORY_CARE_PROVIDER_SITE_OTHER): Payer: Self-pay | Admitting: Surgery

## 2012-10-12 ENCOUNTER — Ambulatory Visit (INDEPENDENT_AMBULATORY_CARE_PROVIDER_SITE_OTHER): Payer: 59 | Admitting: Surgery

## 2012-10-12 VITALS — BP 128/78 | HR 68 | Temp 97.4°F | Resp 16 | Ht 66.5 in | Wt 265.8 lb

## 2012-10-12 DIAGNOSIS — Z9089 Acquired absence of other organs: Secondary | ICD-10-CM

## 2012-10-12 DIAGNOSIS — Z9049 Acquired absence of other specified parts of digestive tract: Secondary | ICD-10-CM

## 2012-10-12 NOTE — Progress Notes (Signed)
Maria Winters 52 y.o.  Body mass index is 42.26 kg/(m^2).  Patient Active Problem List   Diagnosis Date Noted  . S/P lap cholecystectomy July 2014 09/14/2012  . Chest pain, unspecified 04/28/2012  . GERD (gastroesophageal reflux disease) 04/28/2012    Allergies  Allergen Reactions  . Esomeprazole Magnesium     Patient states that the Nexium caused tremors    Past Surgical History  Procedure Laterality Date  . Dilation and curettage, diagnostic / therapeutic  1989    with [place]ization  . Leep    . Cervix dilated    . Esophagogastroduodenoscopy N/A 05/11/2012    Procedure: ESOPHAGOGASTRODUODENOSCOPY (EGD);  Surgeon: Malissa Hippo, MD;  Location: [facility];  Service: Endoscopy;  Laterality: N/A;  125  . Dilation and curettage of uterus    . Cholecystectomy N/A 09/13/2012    Procedure: LAPAROSCOPIC CHOLECYSTECTOMY WITH INTRAOPERATIVE CHOLANGIOGRAM;  Surgeon: Valarie Merino, MD;  Location: [facility];  Service: General;  Laterality: N/A;   [name], MD No diagnosis found.  Doing well after her lap chole.  Incisions OK.  Discussed dietary measures to promote weight loss.  Return prn.   Matt B. Daphine Deutscher, MD, [facility] [phone] beeper [phone]  10/12/2012 12:30 PM

## 2012-10-12 NOTE — Patient Instructions (Signed)
Thanks for your patience.  If you need further assistance after leaving the office, please call our office and speak with a [facility] nurse.  [phone].  If you want to leave a message for Dr. Egidio Lofgren, please call his office phone at [phone]. 

## 2012-12-05 ENCOUNTER — Encounter (INDEPENDENT_AMBULATORY_CARE_PROVIDER_SITE_OTHER): Payer: Self-pay

## 2013-01-19 ENCOUNTER — Other Ambulatory Visit: Payer: Self-pay | Admitting: Family Medicine

## 2013-01-19 DIAGNOSIS — Z1231 Encounter for screening mammogram for malignant neoplasm of breast: Secondary | ICD-10-CM

## 2013-02-02 ENCOUNTER — Ambulatory Visit (HOSPITAL_COMMUNITY): Payer: 59

## 2013-02-21 ENCOUNTER — Ambulatory Visit (HOSPITAL_COMMUNITY)
Admission: RE | Admit: 2013-02-21 | Discharge: 2013-02-21 | Disposition: A | Discharge status: Home / Self Care | Payer: 59 | Source: Ambulatory Visit | Attending: Family Medicine | Admitting: Family Medicine

## 2013-02-21 DIAGNOSIS — Z1231 Encounter for screening mammogram for malignant neoplasm of breast: Secondary | ICD-10-CM | POA: Insufficient documentation

## 2014-07-03 ENCOUNTER — Other Ambulatory Visit: Payer: Self-pay | Admitting: Dermatology

## 2014-07-10 ENCOUNTER — Encounter: Payer: Self-pay | Admitting: Family

## 2014-07-10 ENCOUNTER — Ambulatory Visit (INDEPENDENT_AMBULATORY_CARE_PROVIDER_SITE_OTHER): Payer: 59 | Admitting: Family

## 2014-07-10 VITALS — BP 114/74 | HR 94 | Temp 98.7°F | Ht 66.5 in | Wt 280.0 lb

## 2014-07-10 DIAGNOSIS — J069 Acute upper respiratory infection, unspecified: Secondary | ICD-10-CM | POA: Diagnosis not present

## 2014-07-10 MED ORDER — BENZONATATE 200 MG PO CAPS: 200.0000 mg | ORAL_CAPSULE | Freq: Three times a day (TID) | ORAL | Status: DC | PRN

## 2014-07-10 MED ORDER — AZITHROMYCIN 250 MG PO TABS: ORAL_TABLET | ORAL | Status: DC

## 2014-07-10 NOTE — Progress Notes (Signed)
Subjective:    Patient ID: Maria Winters, female    DOB: [birth year], 54 y.o.   MRN: [id number]  Cough This is a new problem. The current episode started 1 to 4 weeks ago. The problem has been unchanged. The problem occurs every few minutes. The cough is productive of sputum and productive of purulent sputum. Associated symptoms include chills, a fever, headaches, hemoptysis ("just slightly"), myalgias, nasal congestion, postnasal drip, rhinorrhea, a sore throat, shortness of breath and wheezing. Pertinent negatives include no ear congestion or ear pain. The symptoms are aggravated by lying down. She has tried rest and OTC cough suppressant for the symptoms. The treatment provided mild relief. There is no history of asthma or COPD.  Headache  Associated symptoms include coughing, a fever, rhinorrhea and a sore throat. Pertinent negatives include no ear pain.  Fever  Associated symptoms include coughing, headaches, a sore throat and wheezing. Pertinent negatives include no ear pain.      Review of Systems  Constitutional: Positive for fever and chills.  HENT: Positive for postnasal drip, rhinorrhea and sore throat. Negative for ear pain.   Eyes: Negative.   Respiratory: Positive for cough, hemoptysis ("just slightly"), shortness of breath and wheezing.   Cardiovascular: Negative.  Negative for palpitations.  Gastrointestinal: Negative.   Endocrine: Negative.   Genitourinary: Negative.   Musculoskeletal: Positive for myalgias.  Neurological: Positive for headaches.  Hematological: Negative.   Psychiatric/Behavioral: Negative.   All other systems reviewed and are negative.      Objective:   Physical Exam  Constitutional: She is oriented to person, place, and time. She appears well-developed and well-nourished. No distress.  HENT:  Head: Normocephalic and atraumatic.  Right Ear: External ear normal.  Left Ear: External ear normal.  Nasal passage erythemas with mild  swelling  Oropharynx erythemas   Eyes: Pupils are equal, round, and reactive to light.  Neck: Normal range of motion. Neck supple. No thyromegaly present.  Cardiovascular: Normal rate, regular rhythm, normal heart sounds and intact distal pulses.   No murmur heard. Pulmonary/Chest: Effort normal and breath sounds normal. No respiratory distress. She has no wheezes.  Abdominal: Soft. Bowel sounds are normal. She exhibits no distension. There is no tenderness.  Musculoskeletal: Normal range of motion. She exhibits no edema or tenderness.  Neurological: She is alert and oriented to person, place, and time. She has normal reflexes. No cranial nerve deficit.  Skin: Skin is warm and dry.  Psychiatric: She has a normal mood and affect. Her behavior is normal. Judgment and thought content normal.  Vitals reviewed.   BP 114/74 mmHg  Pulse 94  Temp(Src) 98.7 F (37.1 C) (Oral)  Ht 5' 6.5" (1.689 m)  Wt 280 lb (127.007 kg)  BMI 44.52 kg/m2       Assessment & Plan:  1. Acute upper respiratory infection -- Take meds as prescribed - Use a cool mist humidifier  -Use saline nose sprays frequently -Saline irrigations of the nose can be very helpful if done frequently.  * 4X daily for 1 week*  * Use of a nettie pot can be helpful with this. Follow directions with this* -Force fluids -For any cough or congestion  Use plain Mucinex- regular strength or max strength is fine   * Children- consult with Pharmacist for dosing -For fever or aces or pains- take tylenol or ibuprofen appropriate for age and weight.  * for fevers greater than 101 orally you may alternate ibuprofen and tylenol every  3 hours. -  Throat lozenges if help - azithromycin (ZITHROMAX) 250 MG tablet; Take 500 mg once, then 250 mg for four days  Dispense: 6 tablet; Refill: 0 - benzonatate (TESSALON) 200 MG capsule; Take 1 capsule (200 mg total) by mouth 3 (three) times daily as needed.  Dispense: 30 capsule; Refill: Exline, FNP

## 2014-07-10 NOTE — Patient Instructions (Signed)
Upper Respiratory Infection, Adult An upper respiratory infection (URI) is also sometimes known as the common cold. The upper respiratory tract includes the nose, sinuses, throat, trachea, and bronchi. Bronchi are the airways leading to the lungs. Most people improve within 1 week, but symptoms can last up to 2 weeks. A residual cough may last even longer.  CAUSES Many different viruses can infect the tissues lining the upper respiratory tract. The tissues become irritated and inflamed and often become very moist. Mucus production is also common. A cold is contagious. You can easily spread the virus to others by oral contact. This includes kissing, sharing a glass, coughing, or sneezing. Touching your mouth or nose and then touching a surface, which is then touched by another person, can also spread the virus. SYMPTOMS  Symptoms typically develop 1 to 3 days after you come in contact with a cold virus. Symptoms vary from person to person. They may include:  Runny nose.  Sneezing.  Nasal congestion.  Sinus irritation.  Sore throat.  Loss of voice (laryngitis).  Cough.  Fatigue.  Muscle aches.  Loss of appetite.  Headache.  Low-grade fever. DIAGNOSIS  You might diagnose your own cold based on familiar symptoms, since most people get a cold 2 to 3 times a year. Your caregiver can confirm this based on your exam. Most importantly, your caregiver can check that your symptoms are not due to another disease such as strep throat, sinusitis, pneumonia, asthma, or epiglottitis. Blood tests, throat tests, and X-rays are not necessary to diagnose a common cold, but they may sometimes be helpful in excluding other more serious diseases. Your caregiver will decide if any further tests are required. RISKS AND COMPLICATIONS  You may be at risk for a more severe case of the common cold if you smoke cigarettes, have chronic heart disease (such as heart failure) or lung disease (such as asthma), or if  you have a weakened immune system. The very young and very old are also at risk for more serious infections. Bacterial sinusitis, middle ear infections, and bacterial pneumonia can complicate the common cold. The common cold can worsen asthma and chronic obstructive pulmonary disease (COPD). Sometimes, these complications can require emergency medical care and may be life-threatening. PREVENTION  The best way to protect against getting a cold is to practice good hygiene. Avoid oral or hand contact with people with cold symptoms. Wash your hands often if contact occurs. There is no clear evidence that vitamin C, vitamin E, echinacea, or exercise reduces the chance of developing a cold. However, it is always recommended to get plenty of rest and practice good nutrition. TREATMENT  Treatment is directed at relieving symptoms. There is no cure. Antibiotics are not effective, because the infection is caused by a virus, not by bacteria. Treatment may include:  Increased fluid intake. Sports drinks offer valuable electrolytes, sugars, and fluids.  Breathing heated mist or steam (vaporizer or shower).  Eating chicken soup or other clear broths, and maintaining good nutrition.  Getting plenty of rest.  Using gargles or lozenges for comfort.  Controlling fevers with ibuprofen or acetaminophen as directed by your caregiver.  Increasing usage of your inhaler if you have asthma. Zinc gel and zinc lozenges, taken in the first 24 hours of the common cold, can shorten the duration and lessen the severity of symptoms. Pain medicines may help with fever, muscle aches, and throat pain. A variety of non-prescription medicines are available to treat congestion and runny nose. Your caregiver   can make recommendations and may suggest nasal or lung inhalers for other symptoms.  HOME CARE INSTRUCTIONS   Only take over-the-counter or prescription medicines for pain, discomfort, or fever as directed by your  caregiver.  Use a warm mist humidifier or inhale steam from a shower to increase air moisture. This may keep secretions moist and make it easier to breathe.  Drink enough water and fluids to keep your urine clear or pale yellow.  Rest as needed.  Return to work when your temperature has returned to normal or as your caregiver advises. You may need to stay home longer to avoid infecting others. You can also use a face mask and careful hand washing to prevent spread of the virus. SEEK MEDICAL CARE IF:   After the first few days, you feel you are getting worse rather than better.  You need your caregiver's advice about medicines to control symptoms.  You develop chills, worsening shortness of breath, or brown or red sputum. These may be signs of pneumonia.  You develop yellow or brown nasal discharge or pain in the face, especially when you bend forward. These may be signs of sinusitis.  You develop a fever, swollen neck glands, pain with swallowing, or white areas in the back of your throat. These may be signs of strep throat. SEEK IMMEDIATE MEDICAL CARE IF:   You have a fever.  You develop severe or persistent headache, ear pain, sinus pain, or chest pain.  You develop wheezing, a prolonged cough, cough up blood, or have a change in your usual mucus (if you have chronic lung disease).  You develop sore muscles or a stiff neck. Document Released: 08/18/2000 Document Revised: 05/17/2011 Document Reviewed: 05/30/2013 Exit[employer] Patient Information 2015 [employer]. This information is not intended to replace advice given to you by your health care provider. Make sure you discuss any questions you have with your health care provider.  - Take meds as prescribed - Use a cool mist humidifier  -Use saline nose sprays frequently -Saline irrigations of the nose can be very helpful if done frequently.  * 4X daily for 1 week*  * Use of a nettie pot can be helpful with this. Follow  directions with this* -Force fluids -For any cough or congestion  Use plain Mucinex- regular strength or max strength is fine   * Children- consult with Pharmacist for dosing -For fever or aces or pains- take tylenol or ibuprofen appropriate for age and weight.  * for fevers greater than 101 orally you may alternate ibuprofen and tylenol every  3 hours. -Throat lozenges if help   Christy Hawks, FNP   

## 2014-10-09 ENCOUNTER — Telehealth: Payer: Self-pay | Admitting: Family Medicine

## 2014-10-09 ENCOUNTER — Ambulatory Visit (INDEPENDENT_AMBULATORY_CARE_PROVIDER_SITE_OTHER): Payer: 59 | Admitting: Family Medicine

## 2014-10-09 ENCOUNTER — Encounter: Payer: Self-pay | Admitting: Family Medicine

## 2014-10-09 ENCOUNTER — Ambulatory Visit (INDEPENDENT_AMBULATORY_CARE_PROVIDER_SITE_OTHER): Payer: 59

## 2014-10-09 VITALS — BP 120/71 | HR 85 | Temp 97.0°F | Ht 66.5 in | Wt 292.0 lb

## 2014-10-09 DIAGNOSIS — M79671 Pain in right foot: Secondary | ICD-10-CM | POA: Diagnosis not present

## 2014-10-09 MED ORDER — CELECOXIB 200 MG PO CAPS: 200.0000 mg | ORAL_CAPSULE | Freq: Every day | ORAL | Status: DC

## 2014-10-09 NOTE — Patient Instructions (Signed)
Wear shoe two weeks. Then wean slowly as discussed

## 2014-10-09 NOTE — Progress Notes (Signed)
Subjective:  Patient ID: Maria Winters, female    DOB: 01/15/1961  Age: 54 y.o. MRN: [id number]  CC: Foot Pain   HPI Maria Winters presents for Pt. Reports gradual increase in foot pain. Points to dorsal midfoot. Worse with dorsiflexion.No known injury. Pain is moderate dull ache.  History Baani has a past medical history of Helicobacter pylori (H. pylori); GERD (gastroesophageal reflux disease); Reflux; Chest pain; and Anemia.   She has past surgical history that includes Dilation and curettage, diagnostic / therapeutic (1989); LEEP; cervix dilated; Esophagogastroduodenoscopy (N/A, 05/11/2012); Dilation and curettage of uterus; and Cholecystectomy (N/A, 09/13/2012).   Her family history includes Diabetes in her mother; Gout in her brother; Healthy in her sister; Hypertension in her brother, father, and sister; Sickle cell trait in her brother.She reports that she quit smoking about 22 years ago. Her smoking use included Cigarettes. She has a 2.5 pack-year smoking history. She has never used smokeless tobacco. She reports that she drinks alcohol. She reports that she does not use illicit drugs.  Outpatient Prescriptions Prior to Visit  Medication Sig Dispense Refill  . azithromycin (ZITHROMAX) 250 MG tablet Take 500 mg once, then 250 mg for four days (Patient not taking: Reported on 10/09/2014) 6 tablet 0  . benzonatate (TESSALON) 200 MG capsule Take 1 capsule (200 mg total) by mouth 3 (three) times daily as needed. (Patient not taking: Reported on 10/09/2014) 30 capsule 1  . cholecalciferol (VITAMIN D) 1000 UNITS tablet Take 1,000 Units by mouth daily. 2 daily     No facility-administered medications prior to visit.    ROS Review of Systems  Constitutional: Negative for fever, chills, diaphoresis, appetite change and fatigue.  HENT: Negative for congestion, ear pain, hearing loss, postnasal drip, rhinorrhea, sore throat and trouble swallowing.   Respiratory: Negative for cough,  chest tightness and shortness of breath.   Cardiovascular: Negative for chest pain and palpitations.  Gastrointestinal: Negative for abdominal pain.  Musculoskeletal: Positive for arthralgias.  Skin: Negative for rash.    Objective:  BP 120/71 mmHg  Pulse 85  Temp(Src) 97 F (36.1 C) (Oral)  Ht 5' 6.5" (1.689 m)  Wt 292 lb (132.45 kg)  BMI 46.43 kg/m2  BP Readings from Last 3 Encounters:  10/09/14 120/71  07/10/14 114/74  10/12/12 128/78    Wt Readings from Last 3 Encounters:  10/09/14 292 lb (132.45 kg)  07/10/14 280 lb (127.007 kg)  10/12/12 265 lb 12.8 oz (120.566 kg)     Physical Exam  Constitutional: She is oriented to person, place, and time. She appears well-developed and well-nourished. No distress.  HENT:  Head: Normocephalic and atraumatic.  Eyes: Conjunctivae are normal. Pupils are equal, round, and reactive to light.  Cardiovascular: Normal rate, regular rhythm and normal heart sounds.   No murmur heard. Pulmonary/Chest: Effort normal and breath sounds normal. No respiratory distress. She has no wheezes. She has no rales.  Musculoskeletal: Normal range of motion. She exhibits tenderness (at dorsal mid foot. No erythema or edema. From , painful for active or passive dorsiflexion).  Neurological: She is alert and oriented to person, place, and time.  Skin: Skin is warm and dry.  Psychiatric: She has a normal mood and affect. Her behavior is normal. Judgment and thought content normal.    No results found for: HGBA1C  Lab Results  Component Value Date   WBC 11.3* 09/14/2012   HGB 11.3* 09/14/2012   HCT 35.2* 09/14/2012   PLT 334 09/14/2012   GLUCOSE  181* 09/14/2012   ALT 28 09/14/2012   AST 18 09/14/2012   NA 139 09/14/2012   K 4.2 09/14/2012   CL 104 09/14/2012   CREATININE 0.69 09/14/2012   BUN 7 09/14/2012   CO2 30 09/14/2012    Mm Digital Screening 3d Tomo  02/22/2013   CLINICAL DATA:  Screening.  EXAM: DIGITAL SCREENING BILATERAL MAMMOGRAM  WITH 3D TOMO WITH CAD  DIGITAL BREAST TOMOSYNTHESIS  Digital breast tomosynthesis images are acquired in two projections. These images are reviewed in combination with the digital mammogram, confirming the findings below.  COMPARISON:  Previous exam(s).  ACR Breast Density Category b: There are scattered areas of fibroglandular density.  FINDINGS: There are no findings suspicious for malignancy. Images were processed with CAD.  IMPRESSION: No mammographic evidence of malignancy. A result letter of this screening mammogram will be mailed directly to the patient.  RECOMMENDATION: Screening mammogram in one year. (Code:SM-B-01Y)  BI-RADS CATEGORY  1: Negative   Electronically Signed   By: Conchita Paris M.D.   On: 02/22/2013 16:59    Assessment & Plan:   Imajean was seen today for foot pain.  Diagnoses and all orders for this visit:  Right foot pain Orders: -     DG Foot Complete Right -     Post-op shoe  Other orders -     celecoxib (CELEBREX) 200 MG capsule; Take 1 capsule (200 mg total) by mouth daily. With food  Foot XR- no fx noted (prelim) I have discontinued Ms. Barthel's cholecalciferol, azithromycin, and benzonatate. I am also having her start on celecoxib. Additionally, I am having her maintain her clobetasol cream. Wear shoe two weeks. Then wean slowly as discussed Meds ordered this encounter  Medications  . clobetasol cream (TEMOVATE) 0.05 %    Sig:   . celecoxib (CELEBREX) 200 MG capsule    Sig: Take 1 capsule (200 mg total) by mouth daily. With food    Dispense:  30 capsule    Refill:  5     Follow-up: Return if symptoms worsen or fail to improve.  Claretta Fraise, M.D.

## 2015-01-24 ENCOUNTER — Encounter: Payer: Self-pay | Admitting: Family Medicine

## 2015-01-24 ENCOUNTER — Ambulatory Visit (INDEPENDENT_AMBULATORY_CARE_PROVIDER_SITE_OTHER): Payer: 59 | Admitting: Family Medicine

## 2015-01-24 VITALS — BP 132/79 | HR 79 | Temp 98.6°F | Ht 66.5 in | Wt 298.6 lb

## 2015-01-24 DIAGNOSIS — M7541 Impingement syndrome of right shoulder: Secondary | ICD-10-CM | POA: Diagnosis not present

## 2015-01-24 DIAGNOSIS — Z23 Encounter for immunization: Secondary | ICD-10-CM | POA: Diagnosis not present

## 2015-01-24 MED ORDER — METHYLPREDNISOLONE ACETATE 80 MG/ML IJ SUSP: 80.0000 mg | Freq: Once | INTRAMUSCULAR | Status: DC

## 2015-01-24 NOTE — Patient Instructions (Signed)

## 2015-01-24 NOTE — Progress Notes (Signed)
BP 132/79 mmHg  Pulse 79  Temp(Src) 98.6 F (37 C) (Oral)  Ht 5' 6.5" (1.689 m)  Wt 298 lb 9.6 oz (135.444 kg)  BMI 47.48 kg/m2   Subjective:    Patient ID: Maria Winters, female    DOB: 08-08-1960, 54 y.o.   MRN: [id number]  HPI: Maria Winters is a 54 y.o. female presenting on 01/24/2015 for Shoulder Pain   HPI Right shoulder pain Patient has been having worsening right shoulder pain over the past 4 days. She does not know of any specific injury she has had to sleep in a recliner at night because laying on her side hurts that shoulder a lot more. She has been using over-the-counter Aleve for the past 2 days without much improvement. She denies fevers or chills or skin changes in the area. Pain is worse with overhead lifting and a abduction past 90. Pain does not really change with flexion or extension of the shoulder unless she gets over 90.  Relevant past medical, surgical, family and social history reviewed and updated as indicated. Interim medical history since our last visit reviewed. Allergies and medications reviewed and updated.  Review of Systems  Constitutional: Negative for fever and chills.  HENT: Negative for congestion, ear discharge and ear pain.   Eyes: Negative for redness and visual disturbance.  Respiratory: Negative for chest tightness and shortness of breath.   Cardiovascular: Negative for chest pain and leg swelling.  Genitourinary: Negative for dysuria and difficulty urinating.  Musculoskeletal: Positive for arthralgias. Negative for myalgias, back pain, joint swelling, gait problem and neck pain.  Skin: Negative for rash.  Neurological: Negative for light-headedness and headaches.  Psychiatric/Behavioral: Negative for behavioral problems and agitation.  All other systems reviewed and are negative.   Per HPI unless specifically indicated above     Medication List    Notice  As of 01/24/2015  4:43 PM   You have not been prescribed any  medications.         Objective:    BP 132/79 mmHg  Pulse 79  Temp(Src) 98.6 F (37 C) (Oral)  Ht 5' 6.5" (1.689 m)  Wt 298 lb 9.6 oz (135.444 kg)  BMI 47.48 kg/m2  Wt Readings from Last 3 Encounters:  01/24/15 298 lb 9.6 oz (135.444 kg)  10/09/14 292 lb (132.45 kg)  07/10/14 280 lb (127.007 kg)    Physical Exam  Constitutional: She is oriented to person, place, and time. She appears well-developed and well-nourished. No distress.  Eyes: Conjunctivae and EOM are normal. Pupils are equal, round, and reactive to light.  Cardiovascular: Normal rate, regular rhythm, normal heart sounds and intact distal pulses.   No murmur heard. Pulmonary/Chest: Effort normal and breath sounds normal. No respiratory distress. She has no wheezes.  Musculoskeletal: She exhibits no edema.       Right shoulder: She exhibits decreased range of motion (pain limits her active range of motion in abduction to 90, passive range of motion is able to go fully without pain or limitation) and tenderness (along coracoid process).  Neurological: She is alert and oriented to person, place, and time. Coordination normal.  Skin: Skin is warm and dry. No rash noted. She is not diaphoretic.  Psychiatric: She has a normal mood and affect. Her behavior is normal.  Nursing note and vitals reviewed.   Results for orders placed or performed during the hospital encounter of 09/13/12  Creatinine, serum  Result Value Ref Range   Creatinine, Ser 0.62  0.50 - 1.10 mg/dL   GFR calc non Af Amer >90 >90 mL/min   GFR calc Af Amer >90 >90 mL/min  CBC  Result Value Ref Range   WBC 12.2 (H) 4.0 - 10.5 K/uL   RBC 4.52 3.87 - 5.11 MIL/uL   Hemoglobin 11.9 (L) 12.0 - 15.0 g/dL   HCT 36.7 36.0 - 46.0 %   MCV 81.2 78.0 - 100.0 fL   MCH 26.3 26.0 - 34.0 pg   MCHC 32.4 30.0 - 36.0 g/dL   RDW 14.1 11.5 - 15.5 %   Platelets 293 150 - 400 K/uL  Comprehensive metabolic panel  Result Value Ref Range   Sodium 139 135 - 145 mEq/L    Potassium 4.2 3.5 - 5.1 mEq/L   Chloride 104 96 - 112 mEq/L   CO2 30 19 - 32 mEq/L   Glucose, Bld 181 (H) 70 - 99 mg/dL   BUN 7 6 - 23 mg/dL   Creatinine, Ser 0.69 0.50 - 1.10 mg/dL   Calcium 9.1 8.4 - 10.5 mg/dL   Total Protein 6.8 6.0 - 8.3 g/dL   Albumin 2.8 (L) 3.5 - 5.2 g/dL   AST 18 0 - 37 U/L   ALT 28 0 - 35 U/L   Alkaline Phosphatase 91 39 - 117 U/L   Total Bilirubin 0.3 0.3 - 1.2 mg/dL   GFR calc non Af Amer >90 >90 mL/min   GFR calc Af Amer >90 >90 mL/min  CBC  Result Value Ref Range   WBC 11.3 (H) 4.0 - 10.5 K/uL   RBC 4.30 3.87 - 5.11 MIL/uL   Hemoglobin 11.3 (L) 12.0 - 15.0 g/dL   HCT 35.2 (L) 36.0 - 46.0 %   MCV 81.9 78.0 - 100.0 fL   MCH 26.3 26.0 - 34.0 pg   MCHC 32.1 30.0 - 36.0 g/dL   RDW 14.2 11.5 - 15.5 %   Platelets 334 150 - 400 K/uL   Shoulder injection: Risk factors of bleeding and infection discussed with patient and patient is agreeable towards injection. Patient prepped with Betadine. Posterior approach towards injection used. Injected 80mg  of Depo-Medrol and 1 mL of 2% lidocaine. Patient tolerated procedure well and no side effects from noted. Minimal to no bleeding. Simple bandage applied after.     Assessment & Plan:   Problem List Items Addressed This Visit    None    Visit Diagnoses    Shoulder impingement syndrome, right    -  Primary    Relevant Medications    methylPREDNISolone acetate (DEPO-MEDROL) injection 80 mg        Follow up plan: Return if symptoms worsen or fail to improve.  Counseling provided for all of the vaccine components No orders of the defined types were placed in this encounter.    Caryl Pina, MD [facility] 01/24/2015, 4:43 PM

## 2015-04-24 ENCOUNTER — Telehealth: Payer: Self-pay | Admitting: Family Medicine

## 2015-05-09 ENCOUNTER — Ambulatory Visit (INDEPENDENT_AMBULATORY_CARE_PROVIDER_SITE_OTHER): Payer: 59 | Admitting: Pediatrics

## 2015-05-09 VITALS — BP 150/88 | HR 71 | Temp 97.8°F | Ht 66.5 in | Wt 298.6 lb

## 2015-05-09 DIAGNOSIS — R030 Elevated blood-pressure reading, without diagnosis of hypertension: Secondary | ICD-10-CM

## 2015-05-09 DIAGNOSIS — IMO0001 Reserved for inherently not codable concepts without codable children: Secondary | ICD-10-CM | POA: Insufficient documentation

## 2015-05-09 DIAGNOSIS — J069 Acute upper respiratory infection, unspecified: Secondary | ICD-10-CM

## 2015-05-09 DIAGNOSIS — R062 Wheezing: Secondary | ICD-10-CM

## 2015-05-09 MED ORDER — ALBUTEROL SULFATE HFA 108 (90 BASE) MCG/ACT IN AERS: 2.0000 | INHALATION_SPRAY | Freq: Four times a day (QID) | RESPIRATORY_TRACT | Status: DC | PRN

## 2015-05-09 NOTE — Patient Instructions (Signed)
Use albuterol 2 puffs three times a day while having symptoms  Come back in 2 weeks for BP recheck

## 2015-05-09 NOTE — Progress Notes (Signed)
    Subjective:    Patient ID: Maria Winters, female    DOB: 1960/05/18, 55 y.o.   MRN: [id number]  CC: Wheezing; Cough; Headache; and Chills   HPI: [name] is a 55 y.o. female presenting for Wheezing; Cough; Headache; and Chills  Hearing herself wheezing at night when trying to sleep for past four days, sometimes during day as well Started mucinex 4-5 days ago Not sure if helping Coughing starting today Hurts head and chest when she coughs Headache hurting a lot   Depression screen [facility] 2/9 10/09/2014 07/10/2014  Decreased Interest 0 0  Down, Depressed, Hopeless 0 0  PHQ - 2 Score 0 0     Relevant past medical, surgical, family and social history reviewed and updated as indicated. Interim medical history since our last visit reviewed. Allergies and medications reviewed and updated.    ROS: Per HPI unless specifically indicated above  History  Smoking status  . Former Smoker -- 0.25 packs/day for 10 years  . Types: Cigarettes  . Quit date: 06/20/1992  Smokeless tobacco  . Never Used    Comment: Quit in 1995    Past Medical History Patient Active Problem List   Diagnosis Date Noted  . Elevated blood pressure 05/09/2015  . S/P lap cholecystectomy July 2014 09/14/2012  . Chest pain, unspecified 04/28/2012  . GERD (gastroesophageal reflux disease) 04/28/2012    Current Outpatient Prescriptions  Medication Sig Dispense Refill  . albuterol (PROVENTIL HFA;VENTOLIN HFA) 108 (90 Base) MCG/ACT inhaler Inhale 2 puffs into the lungs every 6 (six) hours as needed for wheezing or shortness of breath. 1 Inhaler 0   No current facility-administered medications for this visit.       Objective:    BP 150/88 mmHg  Pulse 71  Temp(Src) 97.8 F (36.6 C) (Oral)  Ht 5' 6.5" (1.689 m)  Wt 298 lb 9.6 oz (135.444 kg)  BMI 47.48 kg/m2  Wt Readings from Last 3 Encounters:  05/09/15 298 lb 9.6 oz (135.444 kg)  01/24/15 298 lb 9.6 oz (135.444 kg)  10/09/14 292 lb  (132.45 kg)    Gen: NAD, alert, cooperative with exam, NCAT EYES: EOMI, no scleral injection or icterus ENT:  TMs pearly gray b/l, OP without erythema LYMPH: no cervical LAD CV: NRRR, normal S1/S2, no murmur, distal pulses 2+ b/l Resp: end of exhalation wheeze b/l, normal WOB, coughing Abd: +BS, soft, NTND. Ext: No edema, warm Neuro: Alert and oriented, strength equal b/l UE and LE, coordination grossly normal MSK: normal muscle bulk     Assessment & Plan:    Xavianna was seen today for wheezing, likely due to acute URI. Discussed symptomatic care for viral URI. Start albuterol for wheezing. BP also elevated today, she doesn't think usually elevated, is taking a lot of cold medicine/decongestants recently. Will check at home over next two weeks, RTC with BP numbers. Start BP meds then if needed, needs labs.  Diagnoses and all orders for this visit:  Wheezing -     albuterol (PROVENTIL HFA;VENTOLIN HFA) 108 (90 Base) MCG/ACT inhaler; Inhale 2 puffs into the lungs every 6 (six) hours as needed for wheezing or shortness of breath.  Acute URI As above  Elevated blood pressure As above  Follow up plan: Return in about 2 weeks (around 05/23/2015).  Assunta Found, MD [facility] 05/09/2015, 6:27 PM

## 2015-05-30 ENCOUNTER — Encounter: Payer: Self-pay | Admitting: Pediatrics

## 2015-05-30 ENCOUNTER — Ambulatory Visit (INDEPENDENT_AMBULATORY_CARE_PROVIDER_SITE_OTHER): Payer: 59 | Admitting: Pediatrics

## 2015-05-30 VITALS — BP 124/67 | HR 90 | Temp 98.2°F | Ht 66.5 in | Wt 300.8 lb

## 2015-05-30 DIAGNOSIS — J309 Allergic rhinitis, unspecified: Secondary | ICD-10-CM

## 2015-05-30 DIAGNOSIS — Z1239 Encounter for other screening for malignant neoplasm of breast: Secondary | ICD-10-CM

## 2015-05-30 DIAGNOSIS — R030 Elevated blood-pressure reading, without diagnosis of hypertension: Secondary | ICD-10-CM | POA: Diagnosis not present

## 2015-05-30 DIAGNOSIS — E119 Type 2 diabetes mellitus without complications: Secondary | ICD-10-CM

## 2015-05-30 DIAGNOSIS — R739 Hyperglycemia, unspecified: Secondary | ICD-10-CM

## 2015-05-30 DIAGNOSIS — IMO0001 Reserved for inherently not codable concepts without codable children: Secondary | ICD-10-CM

## 2015-05-30 LAB — BAYER DCA HB A1C WAIVED: HB A1C: 6.9 % (ref <7.0)

## 2015-05-30 NOTE — Progress Notes (Signed)
    Subjective:    Patient ID: Maria Winters, female    DOB: 04/29/1960, 55 y.o.   MRN: [id number]  CC: Blood Pressure Check   HPI: Maria Winters is a 55 y.o. female presenting for Blood Pressure Check  Elevated BP: didn't check at home. No HA, no vision changes. No chest pain with exertion.  Breathing is much better. Not needing albuterol since being sick.  Pap smear: due, h/o LEEP procedure in distant past  Fam hx: HTN, DM2 Sister with breast cancer PGM w/ breast cancer  Relevant past medical, surgical, family and social history reviewed and updated as indicated. Interim medical history since our last visit reviewed. Allergies and medications reviewed and updated.    ROS: Per HPI unless specifically indicated above  History  Smoking status  . Former Smoker -- 0.25 packs/day for 10 years  . Types: Cigarettes  . Quit date: 06/20/1992  Smokeless tobacco  . Never Used    Comment: Quit in 1995    Past Medical History Patient Active Problem List   Diagnosis Date Noted  . Elevated blood pressure 05/09/2015  . S/P lap cholecystectomy July 2014 09/14/2012  . Chest pain, unspecified 04/28/2012  . GERD (gastroesophageal reflux disease) 04/28/2012    Current Outpatient Prescriptions  Medication Sig Dispense Refill  . albuterol (PROVENTIL HFA;VENTOLIN HFA) 108 (90 Base) MCG/ACT inhaler Inhale 2 puffs into the lungs every 6 (six) hours as needed for wheezing or shortness of breath. 1 Inhaler 0   No current facility-administered medications for this visit.       Objective:    BP 124/67 mmHg  Pulse 90  Temp(Src) 98.2 F (36.8 C) (Oral)  Ht 5' 6.5" (1.689 m)  Wt 300 lb 12.8 oz (136.442 kg)  BMI 47.83 kg/m2  Wt Readings from Last 3 Encounters:  05/30/15 300 lb 12.8 oz (136.442 kg)  05/09/15 298 lb 9.6 oz (135.444 kg)  01/24/15 298 lb 9.6 oz (135.444 kg)     Gen: NAD, alert, cooperative with exam, NCAT EYES: EOMI, no scleral injection or icterus ENT: OP  without erythema LYMPH: no cervical LAD CV: NRRR, normal S1/S2, no murmur, distal pulses 2+ b/l Resp: CTABL, no wheezes, normal WOB Abd: +BS, soft, NTND.  Ext: No edema, warm Neuro: Alert and oriented, strength equal b/l UE and LE, coordination grossly normal     Assessment & Plan:    Maria Winters was seen today for blood pressure check and below problems.  Diagnoses and all orders for this visit:  Elevated blood pressure Improved. Will have  Allergic rhinitis, unspecified allergic rhinitis type Try cetirizine  Screening for breast cancer -     MM Digital Screening; Future  Morbid obesity, unspecified obesity type ([place]) Discussed lifestyle changes -     CMP14+EGFR -     CBC -     TSH -     Bayer DCA Hb A1c Waived -     Lipid panel  Hyperglycemia -     Bayer DCA Hb A1c Waived  Follow up plan: Return in about 4 months (around 09/29/2015) for CPE, pap smear.  Maria Found, MD [facility] 05/30/2015, 5:27 PM

## 2015-05-30 NOTE — Patient Instructions (Signed)
Cetirizine 10 mg once a day

## 2015-05-31 LAB — CMP14+EGFR
A/G RATIO: 1.3 (ref 1.2–2.2)
ALBUMIN: 3.9 g/dL (ref 3.5–5.5)
ALT: 21 IU/L (ref 0–32)
AST: 10 IU/L (ref 0–40)
Alkaline Phosphatase: 94 IU/L (ref 39–117)
BUN / CREAT RATIO: 11 (ref 9–23)
BUN: 7 mg/dL (ref 6–24)
CHLORIDE: 101 mmol/L (ref 96–106)
CO2: 24 mmol/L (ref 18–29)
Calcium: 8.7 mg/dL (ref 8.7–10.2)
Creatinine, Ser: 0.64 mg/dL (ref 0.57–1.00)
GFR calc non Af Amer: 100 mL/min/{1.73_m2} (ref >59)
GFR, EST AFRICAN AMERICAN: 115 mL/min/{1.73_m2} (ref >59)
GLOBULIN, TOTAL: 3 g/dL (ref 1.5–4.5)
Glucose: 104 mg/dL — ABNORMAL HIGH (ref 65–99)
POTASSIUM: 4.1 mmol/L (ref 3.5–5.2)
Sodium: 139 mmol/L (ref 134–144)
TOTAL PROTEIN: 6.9 g/dL (ref 6.0–8.5)

## 2015-05-31 LAB — TSH: TSH: 1.45 u[IU]/mL (ref 0.450–4.500)

## 2015-05-31 LAB — LIPID PANEL
CHOLESTEROL TOTAL: 156 mg/dL (ref 100–199)
Chol/HDL Ratio: 2.4 ratio units (ref 0.0–4.4)
HDL: 66 mg/dL (ref >39)
LDL CALC: 76 mg/dL (ref 0–99)
Triglycerides: 72 mg/dL (ref 0–149)
VLDL Cholesterol Cal: 14 mg/dL (ref 5–40)

## 2015-05-31 LAB — CBC
HEMATOCRIT: 34.2 % (ref 34.0–46.6)
Hemoglobin: 11.4 g/dL (ref 11.1–15.9)
MCH: 26.1 pg — ABNORMAL LOW (ref 26.6–33.0)
MCHC: 33.3 g/dL (ref 31.5–35.7)
MCV: 78 fL — ABNORMAL LOW (ref 79–97)
Platelets: 370 10*3/uL (ref 150–379)
RBC: 4.36 x10E6/uL (ref 3.77–5.28)
RDW: 14.7 % (ref 12.3–15.4)
WBC: 8.8 10*3/uL (ref 3.4–10.8)

## 2015-06-06 DIAGNOSIS — E119 Type 2 diabetes mellitus without complications: Secondary | ICD-10-CM | POA: Insufficient documentation

## 2015-06-06 DIAGNOSIS — J309 Allergic rhinitis, unspecified: Secondary | ICD-10-CM | POA: Insufficient documentation

## 2015-06-06 DIAGNOSIS — E1169 Type 2 diabetes mellitus with other specified complication: Secondary | ICD-10-CM | POA: Insufficient documentation

## 2015-06-06 MED ORDER — METFORMIN HCL 500 MG PO TABS
500.0000 mg | ORAL_TABLET | Freq: Two times a day (BID) | ORAL | Status: DC
Start: 2015-06-06 — End: 2016-03-17

## 2015-06-06 NOTE — Addendum Note (Signed)
Addended by: Eustaquio Maize on: 06/06/2015 01:33 PM   Modules accepted: Orders

## 2015-07-03 ENCOUNTER — Encounter: Payer: Self-pay | Admitting: Pharmacist

## 2015-07-03 ENCOUNTER — Ambulatory Visit (INDEPENDENT_AMBULATORY_CARE_PROVIDER_SITE_OTHER): Payer: 59 | Admitting: Pharmacist

## 2015-07-03 VITALS — BP 126/74 | HR 80 | Ht 67.0 in | Wt 300.0 lb

## 2015-07-03 DIAGNOSIS — E119 Type 2 diabetes mellitus without complications: Secondary | ICD-10-CM | POA: Diagnosis not present

## 2015-07-03 MED ORDER — ONETOUCH DELICA LANCETS 33G MISC: Status: DC

## 2015-07-03 MED ORDER — GLUCOSE BLOOD VI STRP: ORAL_STRIP | Status: DC

## 2015-07-03 NOTE — Patient Instructions (Signed)
Diabetes and Standards of Medical Care   Diabetes is complicated. You may find that your diabetes team includes a dietitian, nurse, diabetes educator, eye doctor, and more. To help everyone know what is going on and to help you get the care you deserve, the following schedule of care was developed to help keep you on track. Below are the tests, exams, vaccines, medicines, education, and plans you will need.  Blood Glucose Goals Prior to meals = 80 - 130 Within 2 hours of the start of a meal = less than 180  HbA1c test (goal is less than 7.0% - your last value was 6.9%) This test shows how well you have controlled your glucose over the past 2 to 3 months. It is used to see if your diabetes management plan needs to be adjusted.   It is performed at least 2 times a year if you are meeting treatment goals.  It is performed 4 times a year if therapy has changed or if you are not meeting treatment goals.  Blood pressure test  This test is performed at every routine medical visit. The goal is less than 140/90 mmHg for most people, but 130/80 mmHg in some cases. Ask your health care provider about your goal.  Dental exam  Follow up with the dentist regularly.  Eye exam  If you are diagnosed with type 1 diabetes as a child, get an exam upon reaching the age of 61 years or older and have had diabetes for 3 to 5 years. Yearly eye exams are recommended after that initial eye exam.  If you are diagnosed with type 1 diabetes as an adult, get an exam within 5 years of diagnosis and then yearly.  If you are diagnosed with type 2 diabetes, get an exam as soon as possible after the diagnosis and then yearly.  Foot care exam  Visual foot exams are performed at every routine medical visit. The exams check for cuts, injuries, or other problems with the feet.  A comprehensive foot exam should be done yearly. This includes visual inspection as well as assessing foot pulses and testing for loss of  sensation.  Check your feet nightly for cuts, injuries, or other problems with your feet. Tell your health care provider if anything is not healing.  Kidney function test (urine microalbumin)  This test is performed once a year.  Type 1 diabetes: The first test is performed 5 years after diagnosis.  Type 2 diabetes: The first test is performed at the time of diagnosis.  A serum creatinine and estimated glomerular filtration rate (eGFR) test is done once a year to assess the level of chronic kidney disease (CKD), if present.  Lipid profile (cholesterol, HDL, LDL, triglycerides)  Performed every 5 years for most people.  The goal for LDL is less than 100 mg/dL. If you are at high risk, the goal is less than 70 mg/dL.  The goal for HDL is 40 mg/dL to 50 mg/dL for men and 50 mg/dL to 60 mg/dL for women. An HDL cholesterol of 60 mg/dL or higher gives some protection against heart disease.  The goal for triglycerides is less than 150 mg/dL.  Influenza vaccine, pneumococcal vaccine, and hepatitis B vaccine  The influenza vaccine is recommended yearly.  The pneumococcal vaccine is generally given once in a lifetime. However, there are some instances when another vaccination is recommended. Check with your health care provider.  The hepatitis B vaccine is also recommended for adults with diabetes.  Diabetes self-management education  Education is recommended at diagnosis and ongoing as needed.  Treatment plan  Your treatment plan is reviewed at every medical visit.  Document Released: 12/20/2008 Document Revised: 10/25/2012 Document Reviewed: 07/25/2012 [facility] Patient Information 2014 [place].

## 2015-07-03 NOTE — Progress Notes (Signed)
Subjective:    Maria Winters is a 55 y.o. female who presents for an initial evaluation of Type 2 diabetes mellitus.  Current symptoms/problems include none.    The patient was initially diagnosed with Type 2 diabetes mellitus based on A1c of 6.9% from 05/30/2015.  She is not currently taking any medications to control diabetes through metformin is on her medication profile  Known diabetic complications: none Cardiovascular risk factors: diabetes mellitus, family history of premature cardiovascular disease, obesity (BMI >= 30 kg/m2) and sedentary lifestyle  Eye exam current (within one year): no Weight trend: stable Prior visit with dietician: no Current diet: in general, an "unhealthy" diet Current exercise: walking - started over the last 2 weeks - she is walking   Current monitoring regimen: none Home blood sugar records: none Any episodes of hypoglycemia? no  Is She on ACE inhibitor or angiotensin II receptor blocker?  No  The following portions of the patient's history were reviewed and updated as appropriate: allergies, current medications, past family history, past medical history, past social history, past surgical history and problem list.   Objective:    BP 126/74 mmHg  Pulse 80  Ht 5\' 7"  (1.702 m)  Wt 300 lb (136.079 kg)  BMI 46.98 kg/m2   Lab Review GLUCOSE (mg/dL)  Date Value  05/30/2015 104*   GLUCOSE, BLD (mg/dL)  Date Value  09/14/2012 181*   CO2  Date Value  05/30/2015 24 mmol/L  09/14/2012 30 mEq/L   BUN (mg/dL)  Date Value  05/30/2015 7  09/14/2012 7   CREATININE (mg/dL)  Date Value  05/16/2010 0.7   CREATININE, SER (mg/dL)  Date Value  05/30/2015 0.64  09/14/2012 0.69  09/13/2012 0.62    Assessment:    Diabetes Mellitus type II, under adequate control.    Plan:    1.  Rx changes: none 2.  Education: Reviewed 'ABCs' of diabetes management (respective goals in parentheses):  A1C (<7), blood pressure (<130/80), and cholesterol  (LDL <100). 3.  Compliance at present is estimated to be good. Efforts to improve compliance (if necessary) will be directed at dietary modifications: discussed CHO limiting diet and portion control, increased exercise and regular blood sugar monitoring: check qd to qod. 4.  Patient given one touch verio IQ glucometer in office and taught to use.  Rx for supplies sent to pharmacy.  Discuss HBG goals. 5.  RTC in 2 months.   Cherre Robins, PharmD, CPP, CDE

## 2015-09-26 ENCOUNTER — Ambulatory Visit: Payer: Self-pay | Admitting: Pediatrics

## 2015-09-26 ENCOUNTER — Telehealth: Payer: Self-pay | Admitting: Family Medicine

## 2015-09-29 ENCOUNTER — Encounter: Payer: Self-pay | Admitting: Family Medicine

## 2015-10-24 ENCOUNTER — Ambulatory Visit: Payer: Self-pay | Admitting: Pediatrics

## 2015-12-12 ENCOUNTER — Ambulatory Visit: Payer: Self-pay | Admitting: Pediatrics

## 2015-12-15 ENCOUNTER — Encounter: Payer: Self-pay | Admitting: Family Medicine

## 2016-02-05 ENCOUNTER — Ambulatory Visit
Admission: RE | Admit: 2016-02-05 | Discharge: 2016-02-05 | Disposition: A | Discharge status: Home / Self Care | Payer: 59 | Source: Ambulatory Visit | Attending: Pediatrics | Admitting: Pediatrics

## 2016-02-05 DIAGNOSIS — Z1239 Encounter for other screening for malignant neoplasm of breast: Secondary | ICD-10-CM

## 2016-02-10 ENCOUNTER — Telehealth: Payer: Self-pay | Admitting: Family Medicine

## 2016-02-10 NOTE — Telephone Encounter (Signed)
Denied.

## 2016-03-05 LAB — HM DIABETES EYE EXAM

## 2016-03-17 ENCOUNTER — Encounter: Payer: Self-pay | Admitting: Neurology

## 2016-03-17 ENCOUNTER — Ambulatory Visit (INDEPENDENT_AMBULATORY_CARE_PROVIDER_SITE_OTHER): Payer: 59 | Admitting: Neurology

## 2016-03-17 DIAGNOSIS — R410 Disorientation, unspecified: Secondary | ICD-10-CM | POA: Diagnosis not present

## 2016-03-17 DIAGNOSIS — G43109 Migraine with aura, not intractable, without status migrainosus: Secondary | ICD-10-CM | POA: Diagnosis not present

## 2016-03-17 DIAGNOSIS — F411 Generalized anxiety disorder: Secondary | ICD-10-CM

## 2016-03-17 NOTE — Patient Instructions (Signed)
-   your symptoms likely due to concentration and attention, multitasking, anxiety. Low suspicious for seizure but will do EEG to rule out - relaxation and de-stress and more organization on multitasking - you also has visual aura or migraine equivalent. If happens very frequent or bothers you, we can start preventive medications - you can use tylenol or ibuprofen if visual aura or HA starts. Use as early as possible. - follow up with PCP and eye doctor regularly. - follow up in 2 months.

## 2016-03-18 DIAGNOSIS — G43109 Migraine with aura, not intractable, without status migrainosus: Secondary | ICD-10-CM | POA: Insufficient documentation

## 2016-03-18 DIAGNOSIS — F411 Generalized anxiety disorder: Secondary | ICD-10-CM | POA: Insufficient documentation

## 2016-03-18 NOTE — Progress Notes (Signed)
NEUROLOGY CLINIC NEW PATIENT NOTE  NAME: Maria Winters DOB: March 19, 1960 REFERRING PHYSICIAN: Anthony Sar, OD  I saw Maria Winters as a new consult in the neurovascular clinic today regarding  Chief Complaint  Patient presents with  . New Patient (Initial Visit)    Referral from Dr. Anthony Sar eye doctor  .  HPI: Maria Winters is a 56 y.o. female with no significant PMH except obesity who presents as a new patient for vision changes.   She stated that about 20-25 years ago, she had one time vision changes when she was driving at that time and suddenly she saw a big circle in her vision field at right eye, initially blurry but then became more and more clear, diamond shape with ripples inside and flashing at outline, then it broken into pieces went to several visual fields before disappearance. Lasting about 5 min. The object was transparent and not affect her driving at that time. She had HA about 2-3 min later, top of head, then down to neck making her not able to turning neck. It lasted about half day and resolved. No recurrent since.   However, 3 weeks ago, similar vision changes happened again, but without HA. Lasting 7-10 min and resolved. It became more and more frequent, from once a week to 2-3 times a week. But since last week, it quit. No significant triggers for the recurrent visual changes.  About a few days earlier, she was working and she apparently had prepared some paperwork for fax but she did it again and without remembering that she had done it already. Last Sunday, she went to bathroom in a restaurant, and she was confused about the bathroom door for a few seconds.   She also complains of both arm numbness when she lies down at night, once she stands up, numbness resolves. She contributed it to her pillow. She admitted that lately, she bought new car, more stress from work, more multitasking and she does feel increased stress and pressure but denies any depression or  panic.   With all these happening, she felt anxious and panic lately. She went to see her optometrist and considered occular migraine, but found to have enlarged blind spot in the left eye and mild optic nerve edema in both eyes. She was referred here for further evaluation.   She denies PMH but was given metformin in the past for weight loss. Denies smoking, alcohol or illicit drugs.   Past Medical History:  Diagnosis Date  . Anemia   . Arthritis   . Chest pain   . GERD (gastroesophageal reflux disease)   . Helicobacter pylori (H. pylori)   . Psoriasis   . Reflux   . Vision abnormalities    tunnel vision   Past Surgical History:  Procedure Laterality Date  . cervix dilated    . CHOLECYSTECTOMY N/A 09/13/2012   Procedure: LAPAROSCOPIC CHOLECYSTECTOMY WITH INTRAOPERATIVE CHOLANGIOGRAM;  Surgeon: Pedro Earls, MD;  Location: [facility];  Service: General;  Laterality: N/A;  . DILATION AND CURETTAGE OF UTERUS    . DILATION AND CURETTAGE, DIAGNOSTIC / THERAPEUTIC  1989   with [place]  . ESOPHAGOGASTRODUODENOSCOPY N/A 05/11/2012   Procedure: ESOPHAGOGASTRODUODENOSCOPY (EGD);  Surgeon: Rogene Houston, MD;  Location: [facility];  Service: Endoscopy;  Laterality: N/A;  125  . LEEP     Family History  Problem Relation Age of Onset  . Diabetes Mother   . Hypertension Father   . Healthy Sister   .  Hypertension Brother   . Gout Brother   . Hypertension Sister   . Sickle cell trait Brother   . Diabetes Maternal Grandmother     with 2 BKA   No current outpatient prescriptions on file.   No current facility-administered medications for this visit.    Allergies  Allergen Reactions  . Esomeprazole Magnesium     Patient states that the Nexium caused tremors   Social History   Social History  . Marital status: Single    Spouse name: N/A  . Number of children: N/A  . Years of education: N/A   Occupational History  . Not on file.   Social History Main Topics  . Smoking  status: Former Smoker    Packs/day: 0.25    Years: 10.00    Types: Cigarettes    Quit date: 06/20/1992  . Smokeless tobacco: Never Used     Comment: Quit in 1995  . Alcohol use 0.6 oz/week    1 Glasses of wine per week     Comment: Patient may drink 2 glasses of wine a year  . Drug use: No  . Sexual activity: No   Other Topics Concern  . Not on file   Social History Narrative  . No narrative on file    Review of Systems Full 14 system review of systems performed and notable only for those listed, all others are neg:  Constitutional:  Memory loss, confusion, HA Cardiovascular:  Ear/Nose/Throat:   Skin: itching Eyes:   Respiratory:   Gastroitestinal:   Genitourinary:  Hematology/Lymphatic:   Endocrine:  Musculoskeletal:   Allergy/Immunology:   Neurological:   Psychiatric:  Sleep:    Physical Exam  Vitals:   03/17/16 1559  BP: 120/78  Pulse: 75    General - obese, well developed, in no apparent distress.  Ophthalmologic - Sharp disc margins OU, no papilledema bilaterally.  Cardiovascular - Regular rate and rhythm with no murmur. Carotid pulses were 2+ without bruits .   Neck - supple, no nuchal rigidity .  Mental Status -  Level of arousal and orientation to time, place, and person were intact. Language including expression, naming, repetition, comprehension, reading, and writing was assessed and found intact. Attention span and concentration were normal. Recent and remote memory were intact. Fund of Knowledge was assessed and was intact.  Cranial Nerves II - XII - II - Visual field intact OU. III, IV, VI - Extraocular movements intact. V - Facial sensation intact bilaterally. VII - Facial movement intact bilaterally. VIII - Hearing & vestibular intact bilaterally. X - Palate elevates symmetrically. XI - Chin turning & shoulder shrug intact bilaterally. XII - Tongue protrusion intact.  Motor Strength - The patient's strength was normal in all  extremities and pronator drift was absent.  Bulk was normal and fasciculations were absent.   Motor Tone - Muscle tone was assessed at the neck and appendages and was normal.  Reflexes - The patient's reflexes were normal in all extremities and she had no pathological reflexes.  Sensory - Light touch, temperature/pinprick, vibration and proprioception, and Romberg testing were assessed and were normal.    Coordination - The patient had normal movements in the hands and feet with no ataxia or dysmetria.  Tremor was absent.  Gait and Station - The patient's transfers, posture, gait, station, and turns were observed as normal.   Imaging none  Lab Review Component     Latest Ref Rng & Units 05/30/2015  Cholesterol, Total  100 - 199 mg/dL 156  Triglycerides     0 - 149 mg/dL 72  HDL Cholesterol     >39 mg/dL 66  VLDL Cholesterol Cal     5 - 40 mg/dL 14  LDL (calc)     0 - 99 mg/dL 76  Total CHOL/HDL Ratio     0.0 - 4.4 ratio units 2.4  TSH     0.450 - 4.500 uIU/mL 1.450  Bayer DCA Hb A1c Waived     <7.0 % 6.9    Assessment and Plan:   In summary, Maria Winters is a 56 y.o. female with no PMH except obesity referred here for evaluation of visual changes, memory gap, confusion and arm numbness. Eye exam concerning for enlarged blind spot on the left and bilateral mild optic disc edema. However, on history taking, her visual symptoms consistent with occular migraine aura. Her confusion more likely related to concentration and attention due to stress and anxiety. Arm numbness likely due to pillow or sleeping position. Low suspicious for seizure but would like to have EEG to rule out. Fundi exam showed sharp disc b/l and no papilledema. No HA.   - symptoms likely due to concentration and attention, multitasking, anxiety. Low suspicious for seizure but will do EEG to rule out - relaxation and de-stress and more organization on multitasking - vision changes consistent with visual  aura or migraine equivalent. If happens very frequent, may consider preventive medications - use tylenol or ibuprofen if visual aura or HA starts. Use as early as possible. - follow up with PCP and optometrist regularly. - follow up in 2 months.   Thank you very much for the opportunity to participate in the care of this patient.  Please do not hesitate to call if any questions or concerns arise.  Orders Placed This Encounter  Procedures  . EEG adult    Standing Status:   Future    Standing Expiration Date:   03/17/2017    No orders of the defined types were placed in this encounter.   Patient Instructions  - your symptoms likely due to concentration and attention, multitasking, anxiety. Low suspicious for seizure but will do EEG to rule out - relaxation and de-stress and more organization on multitasking - you also has visual aura or migraine equivalent. If happens very frequent or bothers you, we can start preventive medications - you can use tylenol or ibuprofen if visual aura or HA starts. Use as early as possible. - follow up with PCP and eye doctor regularly. - follow up in 2 months.    Rosalin Hawking, MD PhD [facility] [address], [place], [place] [address] [phone]

## 2016-03-26 ENCOUNTER — Ambulatory Visit (INDEPENDENT_AMBULATORY_CARE_PROVIDER_SITE_OTHER): Payer: 59 | Admitting: Family Medicine

## 2016-03-26 ENCOUNTER — Encounter: Payer: Self-pay | Admitting: Family Medicine

## 2016-03-26 VITALS — BP 122/76 | HR 86 | Temp 97.9°F | Ht 65.5 in | Wt 301.4 lb

## 2016-03-26 DIAGNOSIS — R400 Somnolence: Secondary | ICD-10-CM

## 2016-03-26 DIAGNOSIS — R6889 Other general symptoms and signs: Secondary | ICD-10-CM

## 2016-03-26 DIAGNOSIS — Z0000 Encounter for general adult medical examination without abnormal findings: Secondary | ICD-10-CM

## 2016-03-26 DIAGNOSIS — Z1159 Encounter for screening for other viral diseases: Secondary | ICD-10-CM

## 2016-03-26 DIAGNOSIS — Z114 Encounter for screening for human immunodeficiency virus [HIV]: Secondary | ICD-10-CM

## 2016-03-26 NOTE — Progress Notes (Signed)
BP 122/76   Pulse 86   Temp 97.9 F (36.6 C) (Oral)   Ht 5' 5.5" (1.664 m)   Wt (!) 301 lb 6.4 oz (136.7 kg)   BMI 49.39 kg/m    Subjective:    Patient ID: Maria Winters, female    DOB: [birth year], 58 y.o.   MRN: [id number]  HPI: Maria Winters is a 56 y.o. female presenting on 03/26/2016 for Annual Exam (gonna see gyn, feels colder than anyone else, thinking is fussy lately wondering about thyroid)   HPI Adult well exam Patient is coming in for a well adult exam and routine screening. She says other than daytime somnolence and cold intolerance she is really doing well and not having any issues. She was concerned because in the past she was told that she might be anemic and she was on some medications for it and she was also on vitamin D. She says she's not having any periods for at least 4 years and denies any hot or cold flashes. She says she is sleeping at night and sleeps well at night but does have some daytime somnolence. She denies snoring but does not have any real center house with her. He denies any chest pain, shortness of breath, headaches or vision issues, abdominal complaints, diarrhea, nausea, vomiting, or joint issues.   Relevant past medical, surgical, family and social history reviewed and updated as indicated. Interim medical history since our last visit reviewed. Allergies and medications reviewed and updated.  Review of Systems  Constitutional: Positive for fatigue. Negative for chills and fever.  HENT: Negative for congestion, ear discharge, ear pain and tinnitus.   Eyes: Negative for pain, redness and visual disturbance.  Respiratory: Negative for cough, chest tightness, shortness of breath and wheezing.   Cardiovascular: Negative for chest pain, palpitations and leg swelling.  Gastrointestinal: Negative for abdominal pain, blood in stool, constipation and diarrhea.  Endocrine: Positive for cold intolerance.  Genitourinary: Negative for difficulty  urinating, dysuria and hematuria.  Musculoskeletal: Negative for back pain, gait problem and myalgias.  Skin: Negative for rash.  Neurological: Negative for dizziness, weakness, light-headedness and headaches.  Psychiatric/Behavioral: Negative for agitation, behavioral problems and suicidal ideas.  All other systems reviewed and are negative.   Per HPI unless specifically indicated above      Objective:    BP 122/76   Pulse 86   Temp 97.9 F (36.6 C) (Oral)   Ht 5' 5.5" (1.664 m)   Wt (!) 301 lb 6.4 oz (136.7 kg)   BMI 49.39 kg/m   Wt Readings from Last 3 Encounters:  03/26/16 (!) 301 lb 6.4 oz (136.7 kg)  03/17/16 (!) 302 lb (137 kg)  07/03/15 300 lb (136.1 kg)    Physical Exam  Constitutional: She is oriented to person, place, and time. She appears well-developed and well-nourished. No distress.  Eyes: Conjunctivae are normal.  Neck: Neck supple. No thyromegaly present.  Cardiovascular: Normal rate, regular rhythm, normal heart sounds and intact distal pulses.   No murmur heard. Pulmonary/Chest: Effort normal and breath sounds normal. No respiratory distress. She has no wheezes. She has no rales.  Musculoskeletal: Normal range of motion. She exhibits no edema or tenderness.  Lymphadenopathy:    She has no cervical adenopathy.  Neurological: She is alert and oriented to person, place, and time. Coordination normal.  Skin: Skin is warm and dry. No rash noted. She is not diaphoretic.  Psychiatric: She has a normal mood and affect. Her behavior  is normal.  Nursing note and vitals reviewed.     Assessment & Plan:   Problem List Items Addressed This Visit    None    Visit Diagnoses    Well adult exam    -  Primary   Relevant Orders   CMP14+EGFR   Lipid panel   HIV antibody   Hepatitis C antibody   Bayer DCA Hb A1c Waived   TSH   Cold intolerance       Relevant Orders   CBC with Differential/Platelet   TSH   VITAMIN D 25 Hydroxy (Vit-D Deficiency, Fractures)    Daytime somnolence       Recommended possible sleep study in the future, we'll discuss it when she is ready to do, we'll do labs first.   Relevant Orders   CBC with Differential/Platelet   CMP14+EGFR   Bayer DCA Hb A1c Waived   TSH   VITAMIN D 25 Hydroxy (Vit-D Deficiency, Fractures)   Need for hepatitis C screening test       Relevant Orders   Hepatitis C antibody   Screening for HIV without presence of risk factors       Relevant Orders   HIV antibody       Follow up plan: Return if symptoms worsen or fail to improve.  Counseling provided for all of the vaccine components Orders Placed This Encounter  Procedures  . CBC with Differential/Platelet  . CMP14+EGFR  . Lipid panel  . HIV antibody  . Hepatitis C antibody  . Bayer DCA Hb A1c Waived  . TSH    Caryl Pina, MD [facility] 03/26/2016, 3:41 PM

## 2016-03-27 ENCOUNTER — Other Ambulatory Visit: Payer: 59

## 2016-03-27 DIAGNOSIS — Z0000 Encounter for general adult medical examination without abnormal findings: Secondary | ICD-10-CM

## 2016-03-27 DIAGNOSIS — Z114 Encounter for screening for human immunodeficiency virus [HIV]: Secondary | ICD-10-CM

## 2016-03-27 DIAGNOSIS — Z1159 Encounter for screening for other viral diseases: Secondary | ICD-10-CM

## 2016-03-27 DIAGNOSIS — R6889 Other general symptoms and signs: Secondary | ICD-10-CM | POA: Diagnosis not present

## 2016-03-27 DIAGNOSIS — R400 Somnolence: Secondary | ICD-10-CM | POA: Diagnosis not present

## 2016-03-28 LAB — CBC WITH DIFFERENTIAL/PLATELET
Basophils Absolute: 0 10*3/uL (ref 0.0–0.2)
Basos: 1 %
EOS (ABSOLUTE): 0.3 10*3/uL (ref 0.0–0.4)
Eos: 5 %
HEMATOCRIT: 36.8 % (ref 34.0–46.6)
Hemoglobin: 11.6 g/dL (ref 11.1–15.9)
IMMATURE GRANS (ABS): 0 10*3/uL (ref 0.0–0.1)
IMMATURE GRANULOCYTES: 0 %
LYMPHS ABS: 1.9 10*3/uL (ref 0.7–3.1)
Lymphs: 29 %
MCH: 25.6 pg — ABNORMAL LOW (ref 26.6–33.0)
MCHC: 31.5 g/dL (ref 31.5–35.7)
MCV: 81 fL (ref 79–97)
MONOCYTES: 7 %
Monocytes Absolute: 0.5 10*3/uL (ref 0.1–0.9)
NEUTROS PCT: 58 %
Neutrophils Absolute: 3.7 10*3/uL (ref 1.4–7.0)
Platelets: 369 10*3/uL (ref 150–379)
RBC: 4.53 x10E6/uL (ref 3.77–5.28)
RDW: 15 % (ref 12.3–15.4)
WBC: 6.4 10*3/uL (ref 3.4–10.8)

## 2016-03-28 LAB — CMP14+EGFR
ALBUMIN: 3.6 g/dL (ref 3.5–5.5)
ALT: 18 IU/L (ref 0–32)
AST: 12 IU/L (ref 0–40)
Albumin/Globulin Ratio: 1.2 (ref 1.2–2.2)
Alkaline Phosphatase: 98 IU/L (ref 39–117)
BUN / CREAT RATIO: 10 (ref 9–23)
BUN: 7 mg/dL (ref 6–24)
Bilirubin Total: 0.2 mg/dL (ref 0.0–1.2)
CALCIUM: 9 mg/dL (ref 8.7–10.2)
CO2: 23 mmol/L (ref 18–29)
CREATININE: 0.7 mg/dL (ref 0.57–1.00)
Chloride: 100 mmol/L (ref 96–106)
GFR calc Af Amer: 111 mL/min/{1.73_m2} (ref >59)
GFR, EST NON AFRICAN AMERICAN: 96 mL/min/{1.73_m2} (ref >59)
GLOBULIN, TOTAL: 3.1 g/dL (ref 1.5–4.5)
Glucose: 134 mg/dL — ABNORMAL HIGH (ref 65–99)
Potassium: 4.3 mmol/L (ref 3.5–5.2)
SODIUM: 141 mmol/L (ref 134–144)
TOTAL PROTEIN: 6.7 g/dL (ref 6.0–8.5)

## 2016-03-28 LAB — TSH: TSH: 1.51 u[IU]/mL (ref 0.450–4.500)

## 2016-03-28 LAB — LIPID PANEL
CHOL/HDL RATIO: 2.5 ratio (ref 0.0–4.4)
Cholesterol, Total: 160 mg/dL (ref 100–199)
HDL: 65 mg/dL (ref >39)
LDL CALC: 80 mg/dL (ref 0–99)
Triglycerides: 74 mg/dL (ref 0–149)
VLDL CHOLESTEROL CAL: 15 mg/dL (ref 5–40)

## 2016-03-28 LAB — HIV ANTIBODY (ROUTINE TESTING W REFLEX): HIV Screen 4th Generation wRfx: NONREACTIVE

## 2016-03-28 LAB — HEPATITIS C ANTIBODY

## 2016-03-29 LAB — BAYER DCA HB A1C WAIVED: HB A1C: 7.3 % — AB (ref <7.0)

## 2016-04-01 ENCOUNTER — Telehealth: Payer: Self-pay | Admitting: *Deleted

## 2016-04-01 MED ORDER — METFORMIN HCL 500 MG PO TABS: 500.0000 mg | ORAL_TABLET | Freq: Two times a day (BID) | ORAL | 1 refills | Status: DC

## 2016-04-01 NOTE — Telephone Encounter (Signed)
-----   Message from Worthy Rancher, MD sent at 03/29/2016  9:33 PM EST ----- Patient has elevated hemoglobin A1c of 7.3. This is worse from her previous. Please send her metformin 500 twice a day and do a referral to Cherre Robins PhD for more education

## 2016-04-01 NOTE — Telephone Encounter (Signed)
Pt notified of results Verbalizes understanding 

## 2016-04-05 ENCOUNTER — Telehealth: Payer: Self-pay | Admitting: Family Medicine

## 2016-04-05 NOTE — Telephone Encounter (Signed)
Typically those symptoms usually last for the first week or 2 but not any longer than that.

## 2016-04-05 NOTE — Telephone Encounter (Signed)
LMOVM of recommendations

## 2016-04-10 DIAGNOSIS — Z23 Encounter for immunization: Secondary | ICD-10-CM | POA: Diagnosis not present

## 2016-04-12 ENCOUNTER — Other Ambulatory Visit: Payer: 59

## 2016-04-13 ENCOUNTER — Ambulatory Visit: Payer: 59 | Admitting: Pharmacist

## 2016-04-20 ENCOUNTER — Ambulatory Visit (INDEPENDENT_AMBULATORY_CARE_PROVIDER_SITE_OTHER): Payer: 59 | Admitting: Pharmacist

## 2016-04-20 ENCOUNTER — Encounter: Payer: Self-pay | Admitting: Pharmacist

## 2016-04-20 VITALS — Ht 66.0 in | Wt 296.0 lb

## 2016-04-20 DIAGNOSIS — E119 Type 2 diabetes mellitus without complications: Secondary | ICD-10-CM

## 2016-04-20 NOTE — Progress Notes (Signed)
Subjective:    Maria Winters is a 56 y.o. female who presents for an follow up  evaluation of Type 2 diabetes mellitus.  I last saw patient 06/2015 when she was initially diagnosed with type 2 DM.  She stopped metformin shortley after that visit and asks today "do I really have diabetes"  She has restarted metformin 04/01/2016 after A1c was found to have increased from 6.9% 05/30/15 to 7.3% 03/27/2016  Current symptoms/problems include hyperglycemia and have been worsening. Symptoms have been present for 3 months.   Known diabetic complications: none Cardiovascular risk factors: DM, obesity  Eye exam current (within one year): yes Weight trend: decreasing steadily - has lost 5# over last 3 weeks Prior visit with CDE: yes - about 1 year ago Current diet: in general, an "unhealthy" diet but has improved since 04/01/16 Current exercise: walking - about 10 minutes daily for last 2 weeks Medication Compliance?  No  Current monitoring regimen: home blood tests - 1 times daily Home blood sugar records: did not bring in records today Any episodes of hypoglycemia? no  Is She on ACE inhibitor or angiotensin II receptor blocker?  No       The following portions of the patient's history were reviewed and updated as appropriate: allergies, current medications and problem list.    Objective:    Ht 5\' 6"  (1.676 m)   Wt 296 lb (134.3 kg)   BMI 47.78 kg/m   Lab Review Glucose (mg/dL)  Date Value  03/27/2016 134 (H)  05/30/2015 104 (H)   Glucose, Bld (mg/dL)  Date Value  09/14/2012 181 (H)   CO2  Date Value  03/27/2016 23 mmol/L  05/30/2015 24 mmol/L  09/14/2012 30 mEq/L   BUN (mg/dL)  Date Value  03/27/2016 7  05/30/2015 7  09/14/2012 7   Creatinine, Ser (mg/dL)  Date Value  03/27/2016 0.70  05/30/2015 0.64  09/14/2012 0.69     Assessment:    Diabetes Mellitus type II, under inadequate control.    Plan:    1.  Rx changes: stessed that she should contineu  metformin and follow up regularly with PCP   Also discussed GLP and SGLT2 medications which can also help with weight loss. 2.  Education: Reviewed 'ABCs' of diabetes management (respective goals in parentheses):  A1C (<7), blood pressure (<130/80), and cholesterol (LDL <100). 3. Discussed pathophysiology of DM; difference between type 1 and type 2 DM. 4. CHO counting diet discussed.  Reviewed CHO amount in various foods and how to read nutrition labels.  Discussed recommended serving sizes.  5.  Recommend check BG 1  times a day 6.  Recommended increase physical activity - goal is 150 minutes per week 7. Follow up: 3 months  - to have PAP and check A1c

## 2016-04-20 NOTE — Patient Instructions (Signed)
Diabetes and Standards of Medical Care   Diabetes is complicated. You may find that your diabetes team includes a dietitian, nurse, diabetes educator, eye doctor, and more. To help everyone know what is going on and to help you get the care you deserve, the following schedule of care was developed to help keep you on track. Below are the tests, exams, vaccines, medicines, education, and plans you will need.  Blood Glucose Goals Prior to meals = 80 - 130 Within 2 hours of the start of a meal = less than 180  HbA1c test (goal is less than 6.5% - your last value was 7.3%) This test shows how well you have controlled your glucose over the past 2 to 3 months. It is used to see if your diabetes management plan needs to be adjusted.   It is performed at least 2 times a year if you are meeting treatment goals.  It is performed 4 times a year if therapy has changed or if you are not meeting treatment goals.  Blood pressure test  This test is performed at every routine medical visit. The goal is less than 140/90 mmHg for most people, but 130/80 mmHg in some cases. Ask your health care provider about your goal.  Dental exam  Follow up with the dentist regularly.  Eye exam  If you are diagnosed with type 1 diabetes as a child, get an exam upon reaching the age of 71 years or older and have had diabetes for 3 to 5 years. Yearly eye exams are recommended after that initial eye exam.  If you are diagnosed with type 1 diabetes as an adult, get an exam within 5 years of diagnosis and then yearly.  If you are diagnosed with type 2 diabetes, get an exam as soon as possible after the diagnosis and then yearly.  Foot care exam  Visual foot exams are performed at every routine medical visit. The exams check for cuts, injuries, or other problems with the feet.  A comprehensive foot exam should be done yearly. This includes visual inspection as well as assessing foot pulses and testing for loss of  sensation.  Check your feet nightly for cuts, injuries, or other problems with your feet. Tell your health care provider if anything is not healing.  Kidney function test (urine microalbumin)  This test is performed once a year.  Type 1 diabetes: The first test is performed 5 years after diagnosis.  Type 2 diabetes: The first test is performed at the time of diagnosis.  A serum creatinine and estimated glomerular filtration rate (eGFR) test is done once a year to assess the level of chronic kidney disease (CKD), if present.  Lipid profile (cholesterol, HDL, LDL, triglycerides)  Performed every 5 years for most people.  The goal for LDL is less than 100 mg/dL. If you are at high risk, the goal is less than 70 mg/dL. Your LDL was 80  The goal for HDL is 40 mg/dL to 50 mg/dL for men and 50 mg/dL to 60 mg/dL for women. An HDL cholesterol of 60 mg/dL or higher gives some protection against heart disease. Your HDL (happy cholesterol) was 65  The goal for triglycerides is less than 150 mg/dL. Your triglycerides were 74.  Influenza vaccine, pneumococcal vaccine, and hepatitis B vaccine  The influenza vaccine is recommended yearly.  The pneumococcal vaccine is generally given once in a lifetime. However, there are some instances when another vaccination is recommended. Check with your health care  provider.  The hepatitis B vaccine is also recommended for adults with diabetes.  Diabetes self-management education  Education is recommended at diagnosis and ongoing as needed.  Treatment plan  Your treatment plan is reviewed at every medical visit.  Document Released: 12/20/2008 Document Revised: 10/25/2012 Document Reviewed: 07/25/2012 [facility] Patient Information 2014 [place].

## 2016-04-21 ENCOUNTER — Encounter: Payer: Self-pay | Admitting: Pharmacist

## 2016-04-21 LAB — MICROALBUMIN / CREATININE URINE RATIO
Creatinine, Urine: 139.8 mg/dL
MICROALBUM., U, RANDOM: 3.4 ug/mL
Microalb/Creat Ratio: 2.4 mg/g creat (ref 0.0–30.0)

## 2016-05-10 ENCOUNTER — Ambulatory Visit: Payer: 59 | Admitting: Neurology

## 2016-06-14 ENCOUNTER — Other Ambulatory Visit: Payer: Self-pay | Admitting: Family

## 2016-07-01 ENCOUNTER — Other Ambulatory Visit: Payer: Self-pay | Admitting: Family

## 2016-07-29 ENCOUNTER — Telehealth: Payer: Self-pay | Admitting: Family Medicine

## 2016-07-29 ENCOUNTER — Encounter: Payer: Self-pay | Admitting: Family Medicine

## 2016-07-29 ENCOUNTER — Ambulatory Visit (INDEPENDENT_AMBULATORY_CARE_PROVIDER_SITE_OTHER): Payer: 59 | Admitting: Family Medicine

## 2016-07-29 ENCOUNTER — Encounter: Payer: Self-pay | Admitting: *Deleted

## 2016-07-29 ENCOUNTER — Ambulatory Visit (INDEPENDENT_AMBULATORY_CARE_PROVIDER_SITE_OTHER): Payer: 59

## 2016-07-29 VITALS — BP 138/80 | HR 84 | Temp 98.4°F | Ht 66.0 in | Wt 292.0 lb

## 2016-07-29 DIAGNOSIS — M545 Low back pain, unspecified: Secondary | ICD-10-CM

## 2016-07-29 DIAGNOSIS — M25562 Pain in left knee: Secondary | ICD-10-CM

## 2016-07-29 MED ORDER — PREDNISONE 20 MG PO TABS: ORAL_TABLET | ORAL | 0 refills | Status: DC

## 2016-07-29 NOTE — Telephone Encounter (Signed)
Yes okay to go ahead and do note for her.

## 2016-07-29 NOTE — Telephone Encounter (Signed)
Patient was seen today. Is this okay to do ?

## 2016-07-29 NOTE — Progress Notes (Signed)
BP 138/80   Pulse 84   Temp 98.4 F (36.9 C) (Oral)   Ht 5\' 6"  (1.676 m)   Wt 292 lb (132.5 kg)   BMI 47.13 kg/m    Subjective:    Patient ID: Maria Winters, female    DOB: Sep 20, 1960, 56 y.o.   MRN: [id number]  HPI: Maria Winters is a 56 y.o. female presenting on 07/29/2016 for Back Pain (lower back pain radiating into hips; x 2 days, taking Aleve) and Knee Pain (left knee)   HPI Low back pain and left knee pain Patient comes in complaining of low back pain that has been going on for the past 2 days. She has had 4 similar flareups of her low back over the past few years. She sees her chiropractor and has had x-rays and diagnosed with osteoarthritis of the low back. She denies any numbness or weakness or radicular symptoms going down either leg. She did start having knee pains yesterday because she was walking differently because of her lower back. She has been using Aleve and does feel like it helped some with her lower back and that it has been improving.  Left knee pain Patient has been having left knee pain that flared up since yesterday. She complains of the left lateral and anterior knee pain. She feels like she has some swelling in that knee as well. She feels like she can't bear weight on it and by the end of the day yesterday it was throbbing and keeping her up at night but when she did finally fall asleep she woke up better in the morning but then has started to worsen through today as well. She denies any fevers or chills or redness or warmth in that knee.  Relevant past medical, surgical, family and social history reviewed and updated as indicated. Interim medical history since our last visit reviewed. Allergies and medications reviewed and updated.  Review of Systems  Constitutional: Negative for chills and fever.  Respiratory: Negative for chest tightness and shortness of breath.   Cardiovascular: Negative for chest pain and leg swelling.  Musculoskeletal:  Positive for arthralgias, back pain, joint swelling and myalgias. Negative for gait problem.  Skin: Negative for color change and rash.  Neurological: Negative for light-headedness and headaches.  Psychiatric/Behavioral: Negative for agitation and behavioral problems.  All other systems reviewed and are negative.   Per HPI unless specifically indicated above        Objective:    BP 138/80   Pulse 84   Temp 98.4 F (36.9 C) (Oral)   Ht 5\' 6"  (1.676 m)   Wt 292 lb (132.5 kg)   BMI 47.13 kg/m   Wt Readings from Last 3 Encounters:  07/29/16 292 lb (132.5 kg)  04/20/16 296 lb (134.3 kg)  03/26/16 (!) 301 lb 6.4 oz (136.7 kg)    Physical Exam  Constitutional: She is oriented to person, place, and time. She appears well-developed and well-nourished. No distress.  Eyes: Conjunctivae are normal.  Cardiovascular: Normal rate, regular rhythm, normal heart sounds and intact distal pulses.   No murmur heard. Pulmonary/Chest: Effort normal and breath sounds normal. No respiratory distress. She has no wheezes. She has no rales.  Musculoskeletal: Normal range of motion. She exhibits no edema.       Left knee: She exhibits effusion and abnormal meniscus. She exhibits normal range of motion, no erythema, normal alignment, no LCL laxity, normal patellar mobility, no bony tenderness and no MCL laxity. Tenderness  found. Lateral joint line tenderness noted.       Lumbar back: She exhibits tenderness (Bilateral lumbar tenderness, worse on right than left. Worse with rotation and flexion and extension, negative straight leg raise bilaterally). She exhibits normal range of motion, no bony tenderness and no swelling.  Neurological: She is alert and oriented to person, place, and time. Coordination normal.  Skin: Skin is warm and dry. No rash noted. She is not diaphoretic.  Psychiatric: She has a normal mood and affect. Her behavior is normal.  Nursing note and vitals reviewed.  Knee x-ray left: Mild  medial compartmental disease bilaterally. Await final read by radiology     Assessment & Plan:   Problem List Items Addressed This Visit    None    Visit Diagnoses    Acute pain of left knee    -  Primary   Will do x-ray and prednisone, return as needed.   Relevant Medications   predniSONE (DELTASONE) 20 MG tablet   Other Relevant Orders   DG Knee 1-2 Views Left (Completed)   Lumbar pain       No sign of sciatica or radiculopathy. Will try short course prednisone and gave a list of stretches and exercises.   Relevant Medications   predniSONE (DELTASONE) 20 MG tablet       Follow up plan: Return if symptoms worsen or fail to improve.  Counseling provided for all of the vaccine components Orders Placed This Encounter  Procedures  . DG Knee 1-2 Views Left    Caryl Pina, MD [facility] 07/29/2016, 9:04 AM

## 2016-07-29 NOTE — Telephone Encounter (Signed)
Pt aware letter ready to fax for tomorrow Pt also aware of x-ray results

## 2016-08-31 ENCOUNTER — Encounter: Payer: Self-pay | Admitting: Family

## 2016-08-31 ENCOUNTER — Ambulatory Visit (INDEPENDENT_AMBULATORY_CARE_PROVIDER_SITE_OTHER): Payer: 59 | Admitting: Family

## 2016-08-31 VITALS — BP 129/78 | HR 74 | Temp 97.9°F | Ht 66.0 in | Wt 286.0 lb

## 2016-08-31 DIAGNOSIS — Z0000 Encounter for general adult medical examination without abnormal findings: Secondary | ICD-10-CM | POA: Diagnosis not present

## 2016-08-31 DIAGNOSIS — Z23 Encounter for immunization: Secondary | ICD-10-CM

## 2016-08-31 DIAGNOSIS — Z01419 Encounter for gynecological examination (general) (routine) without abnormal findings: Secondary | ICD-10-CM | POA: Diagnosis not present

## 2016-08-31 DIAGNOSIS — E119 Type 2 diabetes mellitus without complications: Secondary | ICD-10-CM | POA: Diagnosis not present

## 2016-08-31 DIAGNOSIS — K219 Gastro-esophageal reflux disease without esophagitis: Secondary | ICD-10-CM

## 2016-08-31 LAB — URINALYSIS, COMPLETE
BILIRUBIN UA: NEGATIVE
Glucose, UA: NEGATIVE
Ketones, UA: NEGATIVE
Leukocytes, UA: NEGATIVE
NITRITE UA: NEGATIVE
PROTEIN UA: NEGATIVE
RBC UA: NEGATIVE
Specific Gravity, UA: <1.005 — ABNORMAL LOW (ref 1.005–1.030)
UUROB: 0.2 mg/dL (ref 0.2–1.0)
pH, UA: 6 (ref 5.0–7.5)

## 2016-08-31 LAB — MICROSCOPIC EXAMINATION
RBC, UA: NONE SEEN /hpf (ref 0–?)
RENAL EPITHEL UA: NONE SEEN /HPF

## 2016-08-31 LAB — BAYER DCA HB A1C WAIVED: HB A1C: 6.8 % (ref <7.0)

## 2016-08-31 NOTE — Patient Instructions (Signed)

## 2016-08-31 NOTE — Progress Notes (Signed)
   Subjective:    Patient ID: Maria Winters, female    DOB: 1960-03-30, 56 y.o.   MRN: [id number]  Pt presents to the office today for CPE with PAP.  Gynecologic Exam  The patient's pertinent negatives include no genital lesions, genital odor, vaginal bleeding or vaginal discharge.  Diabetes  She presents for her follow-up diabetic visit. She has type 2 diabetes mellitus. There are no diabetic associated symptoms. Pertinent negatives for diabetes include no foot paresthesias and no visual change. Pertinent negatives for diabetic complications include no heart disease, nephropathy or peripheral neuropathy. Risk factors for coronary artery disease include diabetes mellitus and obesity. Her breakfast blood glucose range is generally 110-130 mg/dl.      Review of Systems  Genitourinary: Negative for vaginal discharge.  All other systems reviewed and are negative.      Objective:   Physical Exam  Constitutional: She is oriented to person, place, and time. She appears well-developed and well-nourished. No distress.  Morbid obese   HENT:  Head: Normocephalic and atraumatic.  Right Ear: External ear normal.  Left Ear: External ear normal.  Nose: Nose normal.  Mouth/Throat: Oropharynx is clear and moist.  Eyes: Pupils are equal, round, and reactive to light.  Neck: Normal range of motion. Neck supple. No thyromegaly present.  Cardiovascular: Normal rate, regular rhythm, normal heart sounds and intact distal pulses.   No murmur heard. Pulmonary/Chest: Effort normal and breath sounds normal. No respiratory distress. She has no wheezes. Right breast exhibits no inverted nipple, no mass, no nipple discharge, no skin change and no tenderness. Left breast exhibits no inverted nipple, no mass, no nipple discharge, no skin change and no tenderness. Breasts are symmetrical.  Abdominal: Soft. Bowel sounds are normal. She exhibits no distension. There is no tenderness.  Genitourinary: Vagina  normal.  Genitourinary Comments: Bimanual exam- no adnexal masses or tenderness, ovaries nonpalpable   Cervix parous and pink- No discharge   Musculoskeletal: Normal range of motion. She exhibits no edema or tenderness.  Neurological: She is alert and oriented to person, place, and time.  Skin: Skin is warm and dry.  Psychiatric: She has a normal mood and affect. Her behavior is normal. Judgment and thought content normal.  Vitals reviewed.     BP 129/78   Pulse 74   Temp 97.9 F (36.6 C) (Oral)   Ht '5\' 6"'$  (1.676 m)   Wt 286 lb (129.7 kg)   BMI 46.16 kg/m      Assessment & Plan:  1. Gynecologic exam normal - Urinalysis, Complete - CMP14+EGFR - Pap IG w/ reflex to HPV when ASC-U  2. Type 2 diabetes mellitus without complication, without long-term current use of insulin (HCC) - Bayer DCA Hb A1c Waived - CMP14+EGFR  3. Gastroesophageal reflux disease without esophagitis - CMP14+EGFR  4. Annual physical exam - Bayer DCA Hb A1c Waived - CMP14+EGFR - Lipid panel - Thyroid Panel With TSH - VITAMIN D 25 Hydroxy (Vit-D Deficiency, Fractures) - CBC with Differential/Platelet - Pap IG w/ reflex to HPV when ASC-U  5. Morbid obesity ([place])   Continue all meds Labs pending Health Maintenance reviewed Diet and exercise encouraged RTO 1 year  Evelina Dun, FNP

## 2016-08-31 NOTE — Addendum Note (Signed)
Addended by: Shelbie Ammons on: 08/31/2016 02:35 PM   Modules accepted: Orders

## 2016-09-01 LAB — CMP14+EGFR
A/G RATIO: 1.2 (ref 1.2–2.2)
ALT: 26 IU/L (ref 0–32)
AST: 14 IU/L (ref 0–40)
Albumin: 3.8 g/dL (ref 3.5–5.5)
Alkaline Phosphatase: 88 IU/L (ref 39–117)
BILIRUBIN TOTAL: 0.2 mg/dL (ref 0.0–1.2)
BUN/Creatinine Ratio: 12 (ref 9–23)
BUN: 8 mg/dL (ref 6–24)
CALCIUM: 9.1 mg/dL (ref 8.7–10.2)
CHLORIDE: 102 mmol/L (ref 96–106)
CO2: 24 mmol/L (ref 20–29)
Creatinine, Ser: 0.66 mg/dL (ref 0.57–1.00)
GFR calc non Af Amer: 98 mL/min/{1.73_m2} (ref >59)
GFR, EST AFRICAN AMERICAN: 113 mL/min/{1.73_m2} (ref >59)
GLOBULIN, TOTAL: 3.1 g/dL (ref 1.5–4.5)
Glucose: 94 mg/dL (ref 65–99)
POTASSIUM: 4.1 mmol/L (ref 3.5–5.2)
SODIUM: 140 mmol/L (ref 134–144)
TOTAL PROTEIN: 6.9 g/dL (ref 6.0–8.5)

## 2016-09-01 LAB — CBC WITH DIFFERENTIAL/PLATELET
BASOS: 1 %
Basophils Absolute: 0.1 10*3/uL (ref 0.0–0.2)
EOS (ABSOLUTE): 0.2 10*3/uL (ref 0.0–0.4)
Eos: 3 %
Hematocrit: 37.6 % (ref 34.0–46.6)
Hemoglobin: 11.8 g/dL (ref 11.1–15.9)
Immature Grans (Abs): 0 10*3/uL (ref 0.0–0.1)
Immature Granulocytes: 0 %
LYMPHS ABS: 2 10*3/uL (ref 0.7–3.1)
Lymphs: 29 %
MCH: 25.7 pg — AB (ref 26.6–33.0)
MCHC: 31.4 g/dL — AB (ref 31.5–35.7)
MCV: 82 fL (ref 79–97)
Monocytes Absolute: 0.5 10*3/uL (ref 0.1–0.9)
Monocytes: 6 %
NEUTROS ABS: 4.3 10*3/uL (ref 1.4–7.0)
Neutrophils: 61 %
PLATELETS: 375 10*3/uL (ref 150–379)
RBC: 4.59 x10E6/uL (ref 3.77–5.28)
RDW: 15.9 % — AB (ref 12.3–15.4)
WBC: 7 10*3/uL (ref 3.4–10.8)

## 2016-09-01 LAB — PAP IG W/ RFLX HPV ASCU: PAP Smear Comment: 0

## 2016-09-01 LAB — VITAMIN D 25 HYDROXY (VIT D DEFICIENCY, FRACTURES): Vit D, 25-Hydroxy: 20.2 ng/mL — ABNORMAL LOW (ref 30.0–100.0)

## 2016-09-01 LAB — THYROID PANEL WITH TSH
FREE THYROXINE INDEX: 2.2 (ref 1.2–4.9)
T3 UPTAKE RATIO: 28 % (ref 24–39)
T4, Total: 7.7 ug/dL (ref 4.5–12.0)
TSH: 0.979 u[IU]/mL (ref 0.450–4.500)

## 2016-09-01 LAB — LIPID PANEL
CHOL/HDL RATIO: 2.5 ratio (ref 0.0–4.4)
Cholesterol, Total: 158 mg/dL (ref 100–199)
HDL: 64 mg/dL (ref >39)
LDL Calculated: 81 mg/dL (ref 0–99)
Triglycerides: 67 mg/dL (ref 0–149)
VLDL Cholesterol Cal: 13 mg/dL (ref 5–40)

## 2016-09-02 ENCOUNTER — Other Ambulatory Visit: Payer: Self-pay | Admitting: Family

## 2016-09-02 MED ORDER — VITAMIN D (ERGOCALCIFEROL) 1.25 MG (50000 UNIT) PO CAPS: 50000.0000 [IU] | ORAL_CAPSULE | ORAL | 3 refills | Status: DC

## 2017-02-04 DIAGNOSIS — Z23 Encounter for immunization: Secondary | ICD-10-CM | POA: Diagnosis not present

## 2017-11-16 ENCOUNTER — Other Ambulatory Visit: Payer: Self-pay

## 2017-11-16 ENCOUNTER — Encounter (HOSPITAL_COMMUNITY): Payer: Self-pay | Admitting: Emergency Medicine

## 2017-11-16 ENCOUNTER — Emergency Department (HOSPITAL_COMMUNITY)
Admission: EM | Admit: 2017-11-16 | Discharge: 2017-11-16 | Disposition: A | Discharge status: Home / Self Care | Payer: 59 | Attending: Emergency Medicine | Admitting: Emergency Medicine

## 2017-11-16 DIAGNOSIS — R42 Dizziness and giddiness: Secondary | ICD-10-CM | POA: Insufficient documentation

## 2017-11-16 DIAGNOSIS — Z87891 Personal history of nicotine dependence: Secondary | ICD-10-CM | POA: Diagnosis not present

## 2017-11-16 DIAGNOSIS — E119 Type 2 diabetes mellitus without complications: Secondary | ICD-10-CM | POA: Insufficient documentation

## 2017-11-16 LAB — CBC
HEMATOCRIT: 38.8 % (ref 36.0–46.0)
Hemoglobin: 12.4 g/dL (ref 12.0–15.0)
MCH: 26.8 pg (ref 26.0–34.0)
MCHC: 32 g/dL (ref 30.0–36.0)
MCV: 84 fL (ref 78.0–100.0)
Platelets: 367 10*3/uL (ref 150–400)
RBC: 4.62 MIL/uL (ref 3.87–5.11)
RDW: 14.1 % (ref 11.5–15.5)
WBC: 7.4 10*3/uL (ref 4.0–10.5)

## 2017-11-16 LAB — BASIC METABOLIC PANEL
Anion gap: 5 (ref 5–15)
BUN: 7 mg/dL (ref 6–20)
CO2: 29 mmol/L (ref 22–32)
CREATININE: 0.69 mg/dL (ref 0.44–1.00)
Calcium: 8.7 mg/dL — ABNORMAL LOW (ref 8.9–10.3)
Chloride: 106 mmol/L (ref 98–111)
GFR calc Af Amer: >60 mL/min (ref >60)
GLUCOSE: 114 mg/dL — AB (ref 70–99)
POTASSIUM: 3.8 mmol/L (ref 3.5–5.1)
Sodium: 140 mmol/L (ref 135–145)

## 2017-11-16 LAB — URINALYSIS, ROUTINE W REFLEX MICROSCOPIC
Bilirubin Urine: NEGATIVE
Glucose, UA: NEGATIVE mg/dL
Hgb urine dipstick: NEGATIVE
KETONES UR: NEGATIVE mg/dL
Leukocytes, UA: NEGATIVE
NITRITE: NEGATIVE
PH: 7 (ref 5.0–8.0)
Protein, ur: NEGATIVE mg/dL
Specific Gravity, Urine: 1.009 (ref 1.005–1.030)

## 2017-11-16 LAB — CBG MONITORING, ED: Glucose-Capillary: 100 mg/dL — ABNORMAL HIGH (ref 70–99)

## 2017-11-16 MED ORDER — MECLIZINE HCL 25 MG PO TABS: 25.0000 mg | ORAL_TABLET | Freq: Three times a day (TID) | ORAL | 0 refills | Status: DC | PRN

## 2017-11-16 MED ORDER — MECLIZINE HCL 12.5 MG PO TABS
25.0000 mg | ORAL_TABLET | Freq: Once | ORAL | Status: AC
  Administered 2017-11-16: 25 mg via ORAL
  Filled 2017-11-16: qty 2

## 2017-11-16 NOTE — ED Triage Notes (Addendum)
Pt reports sudden onset dizziness at 1530. Pt reports symptoms lasted for approximately 5 minutes. Pt reports room was spinning but was able to sit down and symptoms went away. Pt reports nausea and tingling of upper lip on the way to ED but denies any tingling at this time.   Speech clear. Pt denies vision changes or numbness. Pt alert and oriented. Airway patent. Pt able to ambulate but reports feeling "unsteady" when walking.

## 2017-11-16 NOTE — ED Provider Notes (Signed)
[facility] EMERGENCY DEPARTMENT Provider Note   CSN: [id number] Arrival date & time: 11/16/17  1752     History   Chief Complaint Chief Complaint  Patient presents with  . Dizziness    HPI Maria Winters is a 57 y.o. female.  Patient reported sense of dizziness described as room spinning about 330 today lasting 5 minutes.  Symptoms have cleared now.  No prodromal illnesses.  No earache.  No facial asymmetry, extremity weakness, visual changes, stiff neck.  She took nothing for symptoms at home.  Severity is moderate.     Past Medical History:  Diagnosis Date  . Anemia   . Arthritis   . Chest pain   . GERD (gastroesophageal reflux disease)   . Helicobacter pylori (H. pylori)   . Psoriasis   . Reflux   . Vision abnormalities    tunnel vision    Patient Active Problem List   Diagnosis Date Noted  . Anxiety state 03/18/2016  . Migraine equivalent 03/18/2016  . Confusion 03/17/2016  . Morbid obesity ([place]) 06/06/2015  . Type 2 diabetes mellitus without complication, without long-term current use of insulin ([place]) 06/06/2015  . Elevated blood pressure 05/09/2015  . S/P lap cholecystectomy July 2014 09/14/2012  . Chest pain, unspecified 04/28/2012  . GERD (gastroesophageal reflux disease) 04/28/2012    Past Surgical History:  Procedure Laterality Date  . cervix dilated    . CHOLECYSTECTOMY N/A 09/13/2012   Procedure: LAPAROSCOPIC CHOLECYSTECTOMY WITH INTRAOPERATIVE CHOLANGIOGRAM;  Surgeon: Pedro Earls, MD;  Location: [facility] ORS;  Service: General;  Laterality: N/A;  . DILATION AND CURETTAGE OF UTERUS    . DILATION AND CURETTAGE, DIAGNOSTIC / THERAPEUTIC  1989   with [place]  . ESOPHAGOGASTRODUODENOSCOPY N/A 05/11/2012   Procedure: ESOPHAGOGASTRODUODENOSCOPY (EGD);  Surgeon: Rogene Houston, MD;  Location: [facility] SUITE;  Service: Endoscopy;  Laterality: N/A;  125  . LEEP       OB History   None      Home Medications    Prior to Admission medications     Medication Sig Start Date End Date Taking? Authorizing Provider  meclizine (ANTIVERT) 25 MG tablet Take 25 mg by mouth once as needed for dizziness.   Yes [provider]  meclizine (ANTIVERT) 25 MG tablet Take 1 tablet (25 mg total) by mouth 3 (three) times daily as needed for dizziness. 11/16/17   Nat Christen, MD    Family History Family History  Problem Relation Age of Onset  . Diabetes Mother   . Hypertension Father   . Healthy Sister   . Hypertension Brother   . Gout Brother   . Hypertension Sister   . Sickle cell trait Brother   . Diabetes Maternal Grandmother        with 2 BKA    Social History Social History   Tobacco Use  . Smoking status: Former Smoker    Packs/day: 0.25    Years: 10.00    Pack years: 2.50    Types: Cigarettes    Last attempt to quit: 06/20/1992    Years since quitting: 25.4  . Smokeless tobacco: Never Used  . Tobacco comment: Quit in 1995  Substance Use Topics  . Alcohol use: Yes    Alcohol/week: 1.0 standard drinks    Types: 1 Glasses of wine per week    Comment: Patient may drink 2 glasses of wine a year  . Drug use: No     Allergies   Esomeprazole magnesium  Review of Systems Review of Systems  All other systems reviewed and are negative.    Physical Exam Updated Vital Signs BP 131/65   Pulse 81   Temp 98 F (36.7 C)   Resp 17   Ht 5' 6.5" (1.689 m)   Wt 127 kg   SpO2 97%   BMI 44.52 kg/m   Physical Exam  Constitutional: She is oriented to person, place, and time. She appears well-developed and well-nourished.  HENT:  Head: Normocephalic and atraumatic.  Eyes: Conjunctivae are normal.  Neck: Neck supple.  Cardiovascular: Normal rate and regular rhythm.  Pulmonary/Chest: Effort normal and breath sounds normal.  Abdominal: Soft. Bowel sounds are normal.  Musculoskeletal: Normal range of motion.  Neurological: She is alert and oriented to person, place, and time.  Skin: Skin is warm and dry.   Psychiatric: She has a normal mood and affect. Her behavior is normal.  Nursing note and vitals reviewed.    ED Treatments / Results  Labs (all labs ordered are listed, but only abnormal results are displayed) Labs Reviewed  BASIC METABOLIC PANEL - Abnormal; Notable for the following components:      Result Value   Glucose, Bld 114 (*)    Calcium 8.7 (*)    All other components within normal limits  URINALYSIS, ROUTINE W REFLEX MICROSCOPIC - Abnormal; Notable for the following components:   Color, Urine STRAW (*)    All other components within normal limits  CBG MONITORING, ED - Abnormal; Notable for the following components:   Glucose-Capillary 100 (*)    All other components within normal limits  CBC    EKG None  Radiology No results found.  Procedures Procedures (including critical care time)  Medications Ordered in ED Medications  meclizine (ANTIVERT) tablet 25 mg (25 mg Oral Given 11/16/17 2013)     Initial Impression / Assessment and Plan / ED Course  I have reviewed the triage vital signs and the nursing notes.  Pertinent labs & imaging results that were available during my care of the patient were reviewed by me and considered in my medical decision making (see chart for details).     History and physical most consistent with vertigo.  Patient has no neurological deficits in the emergency department.  Screening labs were normal.  Will Rx meclizine 25 mg.  Discussed with patient.  Final Clinical Impressions(s) / ED Diagnoses   Final diagnoses:  Vertigo    ED Discharge Orders         Ordered    meclizine (ANTIVERT) 25 MG tablet  3 times daily PRN     11/16/17 2035           Nat Christen, MD 11/16/17 2038

## 2017-11-16 NOTE — ED Notes (Signed)
Pt ambulated from wheelchair into bathroom, denies nausea and dizziness, ambulated with steady gait.

## 2017-11-16 NOTE — Discharge Instructions (Addendum)
Blood work was good.  Prescription for vertigo (inner ear).  Follow-up with your primary care doctor or return if worse.

## 2017-11-18 ENCOUNTER — Telehealth: Payer: Self-pay | Admitting: Family Medicine

## 2017-11-21 NOTE — Telephone Encounter (Signed)
LM [id number]-jhb

## 2017-11-29 NOTE — Telephone Encounter (Signed)
Patient made appt 9/19 755 with Dettinger

## 2017-12-23 ENCOUNTER — Encounter: Payer: Self-pay | Admitting: Family Medicine

## 2017-12-23 ENCOUNTER — Ambulatory Visit: Payer: 59 | Admitting: Family Medicine

## 2017-12-23 VITALS — BP 132/76 | HR 74 | Temp 97.8°F | Ht 66.5 in | Wt 284.0 lb

## 2017-12-23 DIAGNOSIS — Z23 Encounter for immunization: Secondary | ICD-10-CM

## 2017-12-23 DIAGNOSIS — R42 Dizziness and giddiness: Secondary | ICD-10-CM | POA: Diagnosis not present

## 2017-12-23 NOTE — Progress Notes (Signed)
BP 132/76   Pulse 74   Temp 97.8 F (36.6 C) (Oral)   Ht 5' 6.5" (1.689 m)   Wt 284 lb (128.8 kg)   BMI 45.15 kg/m    Subjective:    Patient ID: Maria Winters, female    DOB: 14-Mar-1960, 57 y.o.   MRN: [id number]  HPI: Maria Winters is a 57 y.o. female presenting on 12/23/2017 for Hospitalization Follow-up   HPI Vertigo Patient is coming in today for ER follow-up for vertigo, she was in the emergency department on 11/16/2017 and she was given meclizine.  She says since leaving the hospital and having this incident she has not had any further incidents.  She did describe it as a spinning sensation with nausea and dizziness but she has not had that since.  She says that she has been feeling well since then and has not had to take any of the meclizine that they gave her.  Patient denies any focal numbness or weakness or visual changes or chest pain or shortness of breath.  Relevant past medical, surgical, family and social history reviewed and updated as indicated. Interim medical history since our last visit reviewed. Allergies and medications reviewed and updated.  Review of Systems  Constitutional: Negative for chills and fever.  Eyes: Negative for visual disturbance.  Respiratory: Negative for chest tightness and shortness of breath.   Cardiovascular: Negative for chest pain and leg swelling.  Musculoskeletal: Negative for back pain and gait problem.  Skin: Negative for rash.  Neurological: Positive for dizziness. Negative for weakness, light-headedness, numbness and headaches.  Psychiatric/Behavioral: Negative for agitation and behavioral problems.  All other systems reviewed and are negative.   Per HPI unless specifically indicated above   Allergies as of 12/23/2017      Reactions   Esomeprazole Magnesium    Patient states that the Nexium caused tremors      Medication List        Accurate as of 12/23/17  8:14 AM. Always use your most recent med list.            meclizine 25 MG tablet Commonly known as:  ANTIVERT Take 25 mg by mouth once as needed for dizziness.          Objective:    BP 132/76   Pulse 74   Temp 97.8 F (36.6 C) (Oral)   Ht 5' 6.5" (1.689 m)   Wt 284 lb (128.8 kg)   BMI 45.15 kg/m   Wt Readings from Last 3 Encounters:  12/23/17 284 lb (128.8 kg)  11/16/17 280 lb (127 kg)  08/31/16 286 lb (129.7 kg)    Physical Exam  Constitutional: She is oriented to person, place, and time. She appears well-developed and well-nourished. No distress.  Eyes: Conjunctivae are normal.  Neck: Neck supple. No thyromegaly present.  Cardiovascular: Normal rate, regular rhythm, normal heart sounds and intact distal pulses.  No murmur heard. Pulmonary/Chest: Effort normal and breath sounds normal. No respiratory distress. She has no wheezes.  Lymphadenopathy:    She has no cervical adenopathy.  Neurological: She is alert and oriented to person, place, and time. She displays normal reflexes. No cranial nerve deficit. She exhibits normal muscle tone. Coordination normal.  Skin: Skin is warm and dry. No rash noted. She is not diaphoretic.  Psychiatric: She has a normal mood and affect. Her behavior is normal.  Nursing note and vitals reviewed.     Assessment & Plan:   Problem List Items  Addressed This Visit    None    Visit Diagnoses    Vertigo    -  Primary    Patient has been doing well with vertigo and has not had any symptoms since the initial ER visit.  Follow up plan: Return if symptoms worsen or fail to improve, for As soon as possible for a physical with labs.  Counseling provided for all of the vaccine components No orders of the defined types were placed in this encounter.   Caryl Pina, MD [facility] 12/23/2017, 8:14 AM

## 2018-02-13 ENCOUNTER — Encounter: Payer: Self-pay | Admitting: *Deleted

## 2018-02-13 ENCOUNTER — Ambulatory Visit (INDEPENDENT_AMBULATORY_CARE_PROVIDER_SITE_OTHER): Payer: 59 | Admitting: Pediatrics

## 2018-02-13 ENCOUNTER — Encounter: Payer: Self-pay | Admitting: Pediatrics

## 2018-02-13 VITALS — BP 134/85 | HR 74 | Temp 97.9°F | Ht 66.5 in | Wt 285.0 lb

## 2018-02-13 DIAGNOSIS — J029 Acute pharyngitis, unspecified: Secondary | ICD-10-CM | POA: Diagnosis not present

## 2018-02-13 DIAGNOSIS — J028 Acute pharyngitis due to other specified organisms: Secondary | ICD-10-CM | POA: Diagnosis not present

## 2018-02-13 DIAGNOSIS — J069 Acute upper respiratory infection, unspecified: Secondary | ICD-10-CM

## 2018-02-13 NOTE — Progress Notes (Signed)
  Subjective:   Patient ID: Maria Winters, female    DOB: [birth year], 57 y.o.   MRN: [id number] CC: Sore Throat and Headache  HPI: Maria Winters is a 57 y.o. female   Started getting sick 5 to 7 days ago.  Throat was very sore, sharp pain with swallowing at first.  Sore throat improved over the last 2 to 3 days.  She says it felt like strep throat when she first got sick.  No fevers.  Appetite has been okay.  Coughing some at baseline.  Relevant past medical, surgical, family and social history reviewed. Allergies and medications reviewed and updated. Social History   Tobacco Use  Smoking Status Former Smoker  . Packs/day: 0.25  . Years: 10.00  . Pack years: 2.50  . Types: Cigarettes  . Last attempt to quit: 06/20/1992  . Years since quitting: 25.6  Smokeless Tobacco Never Used  Tobacco Comment   Quit in 1995   ROS: Per HPI   Objective:    BP 134/85   Pulse 74   Temp 97.9 F (36.6 C) (Oral)   Ht 5' 6.5" (1.689 m)   Wt 285 lb (129.3 kg)   BMI 45.31 kg/m   Wt Readings from Last 3 Encounters:  02/13/18 285 lb (129.3 kg)  12/23/17 284 lb (128.8 kg)  11/16/17 280 lb (127 kg)    Gen: NAD, alert, cooperative with exam, NCAT EYES: EOMI, no conjunctival injection, or no icterus ENT:  TMs pearly gray b/l with small amount layering clear fluid, OP with mild erythema LYMPH: no cervical LAD CV: NRRR, normal S1/S2, no murmur, distal pulses 2+ b/l Resp: CTABL, no wheezes, normal WOB Ext: No edema, warm Neuro: Alert and oriented, strength equal b/l UE and LE, coordination grossly normal MSK: normal muscle bulk  Assessment & Plan:  Maria Winters was seen today for sore throat and headache.  Diagnoses and all orders for this visit:  Acute URI Rapid strep negative.  Improving over the last couple days symptom care, return precautions discussed.  Sore throat -     Rapid Strep Screen ([facility] ONLY) -     Culture, Group A Strep -     Culture, Group A Strep   Follow  up plan: Return in about 4 weeks (around 03/13/2018), or if symptoms worsen or fail to improve.  Overdue for chronic follow-up with PCP.  Last A1c 08/2016 Assunta Found, MD [place]

## 2018-02-16 LAB — CULTURE, GROUP A STREP: Strep A Culture: NEGATIVE

## 2018-02-17 LAB — RAPID STREP SCREEN (MED CTR MEBANE ONLY): Strep Gp A Ag, IA W/Reflex: NEGATIVE

## 2018-02-17 LAB — CULTURE, GROUP A STREP

## 2018-07-06 ENCOUNTER — Telehealth: Payer: Self-pay | Admitting: Family Medicine

## 2018-07-24 ENCOUNTER — Ambulatory Visit (INDEPENDENT_AMBULATORY_CARE_PROVIDER_SITE_OTHER): Payer: 59 | Admitting: Family Medicine

## 2018-07-24 ENCOUNTER — Other Ambulatory Visit: Payer: Self-pay

## 2018-07-24 ENCOUNTER — Encounter: Payer: Self-pay | Admitting: Family Medicine

## 2018-07-24 DIAGNOSIS — K649 Unspecified hemorrhoids: Secondary | ICD-10-CM | POA: Diagnosis not present

## 2018-07-24 MED ORDER — POLYETHYLENE GLYCOL 3350 17 GM/SCOOP PO POWD: 17.0000 g | Freq: Every day | ORAL | 1 refills | Status: DC | PRN

## 2018-07-24 MED ORDER — PHENYLEPHRINE IN HARD FAT 0.25 % RE SUPP: 1.0000 | Freq: Two times a day (BID) | RECTAL | 0 refills | Status: DC

## 2018-07-24 NOTE — Progress Notes (Signed)
   Virtual Visit via telephone Note  I connected with Maria Winters on 07/24/18 at 1355 by telephone and verified that I am speaking with the correct person using two identifiers. Maria Winters is currently located at car and no other people are currently with her during visit. The provider, Fransisca Kaufmann Dettinger, MD is located in their office at time of visit.  Call ended at 1605  I discussed the limitations, risks, security and privacy concerns of performing an evaluation and management service by telephone and the availability of in person appointments. I also discussed with the patient that there may be a patient responsible charge related to this service. The patient expressed understanding and agreed to proceed.   History and Present Illness: Patient has been dealing with internal hemorrhoids.  She has been doing colace and increasing fiber.  Patient says she has pain with bowel movements and bleeding with bowel movements.  It has been an issue for over one month.  She uses tucks pads. Her bowel movements have been harder and more firm.  She has used preparation H suppository.    No diagnosis found.  Outpatient Encounter Medications as of 07/24/2018  Medication Sig  . meclizine (ANTIVERT) 25 MG tablet Take 25 mg by mouth once as needed for dizziness.   No facility-administered encounter medications on file as of 07/24/2018.     Review of Systems  Constitutional: Negative for chills and fever.  Eyes: Negative for visual disturbance.  Respiratory: Negative for chest tightness and shortness of breath.   Cardiovascular: Negative for chest pain and leg swelling.  Gastrointestinal: Positive for anal bleeding, blood in stool and constipation.  All other systems reviewed and are negative.   Observations/Objective: Patient sounds comfortable and in no acute distress  Assessment and Plan: Problem List Items Addressed This Visit    None    Visit Diagnoses    Hemorrhoids,  unspecified hemorrhoid type    -  Primary   Relevant Medications   phenylephrine (,USE FOR PREPARATION-H,) 0.25 % suppository   polyethylene glycol powder (GLYCOLAX/MIRALAX) 17 GM/SCOOP powder       Follow Up Instructions: As needed, if does not improve with these measures then she may need to go to talk to a surgeon or her gastroenterologist for hemorrhoidectomy    I discussed the assessment and treatment plan with the patient. The patient was provided an opportunity to ask questions and all were answered. The patient agreed with the plan and demonstrated an understanding of the instructions.   The patient was advised to call back or seek an in-person evaluation if the symptoms worsen or if the condition fails to improve as anticipated.  The above assessment and management plan was discussed with the patient. The patient verbalized understanding of and has agreed to the management plan. Patient is aware to call the clinic if symptoms persist or worsen. Patient is aware when to return to the clinic for a follow-up visit. Patient educated on when it is appropriate to go to the emergency department.    I provided 10 minutes of non-face-to-face time during this encounter.    Worthy Rancher, MD

## 2019-03-19 ENCOUNTER — Ambulatory Visit: Payer: Self-pay | Admitting: *Deleted

## 2019-03-19 NOTE — Telephone Encounter (Signed)
Message from Mathis Bud sent at 03/19/2019 1:10 PM EST  Summary: covid vaccine   Patient is asking questions regarding covid vaccine. Patient recently got shingles vaccine and wants to know if it is safe to ger covid vaccine,  call back  Cell phone [phone]         Pt calling to ask how long after first Shingles vaccine she received recently can she take COVID vaccine. Informed her that we are on 1B and her age and health status may be a while for COVID vaccine. 2nd Shingles due end of Feb, she will evaluate and ask MD/pharmacist prior to receiving either.  Reason for Disposition . Has received chickenpox vaccine before  Answer Assessment - Initial Assessment Questions 1. TYPE of CONTACT: "How much contact was there?" (e.g., live in same house, work in same office)     none 2. DATE of CONTACT: "When did you have contact with the chickenpox patient?" (e.g., days)     none 3. SYMPTOMS: "Do you have any symptoms?" "Any rash?" "Any fever?"     no 4. VARICELLA VACCINE: "Have you ever received the chickenpox vaccine?"     yes 6. PREGNANCY: "Is there any chance you are pregnant?" "When was your last menstrual period?"     na 7. RISK FACTORS: "Do you have a weakened immune system?" (e.g., HIV positive, cancer chemotherapy, chronic steroid treatment, splenectomy)     no  Protocols used: CHICKENPOX EXPOSURE-A-AH

## 2019-05-09 ENCOUNTER — Ambulatory Visit: Payer: 59 | Admitting: Family Medicine

## 2019-06-20 ENCOUNTER — Other Ambulatory Visit: Payer: Self-pay | Admitting: *Deleted

## 2019-06-20 NOTE — Telephone Encounter (Signed)
Left message to call back to make appt.

## 2019-06-20 NOTE — Telephone Encounter (Signed)
Pt. Needs to be seen for this. Thanks, WS 

## 2019-07-23 ENCOUNTER — Encounter: Payer: Self-pay | Admitting: Family

## 2019-07-23 ENCOUNTER — Ambulatory Visit (INDEPENDENT_AMBULATORY_CARE_PROVIDER_SITE_OTHER): Payer: 59 | Admitting: Family

## 2019-07-23 DIAGNOSIS — K5900 Constipation, unspecified: Secondary | ICD-10-CM

## 2019-07-23 DIAGNOSIS — E119 Type 2 diabetes mellitus without complications: Secondary | ICD-10-CM

## 2019-07-23 DIAGNOSIS — R42 Dizziness and giddiness: Secondary | ICD-10-CM | POA: Diagnosis not present

## 2019-07-23 MED ORDER — DOCUSATE SODIUM 100 MG PO CAPS: 100.0000 mg | ORAL_CAPSULE | Freq: Every day | ORAL | 2 refills | Status: DC

## 2019-07-23 MED ORDER — MECLIZINE HCL 25 MG PO TABS: 25.0000 mg | ORAL_TABLET | Freq: Two times a day (BID) | ORAL | 4 refills | Status: DC | PRN

## 2019-07-23 NOTE — Progress Notes (Signed)
Virtual Visit via telephone Note Due to COVID-19 pandemic this visit was conducted virtually. This visit type was conducted due to national recommendations for restrictions regarding the COVID-19 Pandemic (e.g. social distancing, sheltering in place) in an effort to limit this patient's exposure and mitigate transmission in our community. All issues noted in this document were discussed and addressed.  A physical exam was not performed with this format.  I connected with Maria Winters on 07/23/19 at 9:33 Am  by telephone and verified that I am speaking with the correct person using two identifiers. Maria Winters is currently located at home and no one is currently with her during visit. The provider, Evelina Dun, FNP is located in their office at time of visit.  I discussed the limitations, risks, security and privacy concerns of performing an evaluation and management service by telephone and the availability of in person appointments. I also discussed with the patient that there may be a patient responsible charge related to this service. The patient expressed understanding and agreed to proceed.   History and Present Illness:  Dizziness This is a chronic problem. The current episode started more than 1 year ago. The problem occurs intermittently. The problem has been waxing and waning. Associated symptoms include vertigo. Pertinent negatives include no arthralgias, chills, congestion, coughing, fever, nausea or vomiting. The symptoms are aggravated by bending. She has tried rest (antivert) for the symptoms. The treatment provided mild relief.  Constipation This is a chronic problem. The current episode started more than 1 year ago. The problem has been resolved since onset. Her stool frequency is 1 time per day. Pertinent negatives include no fever, nausea or vomiting. Risk factors include obesity. She has tried stool softeners for the symptoms. The treatment provided moderate relief.    Diabetes She presents for her follow-up diabetic visit. She has type 2 diabetes mellitus. Her disease course has been stable. There are no hypoglycemic associated symptoms. Pertinent negatives for diabetes include no blurred vision and no foot paresthesias. Symptoms are stable. Pertinent negatives for diabetic complications include no CVA or heart disease. Risk factors for coronary artery disease include diabetes mellitus, hypertension and sedentary lifestyle. She is following a generally healthy diet. Her overall blood glucose range is 110-130 mg/dl.      Review of Systems  Constitutional: Negative for chills and fever.  HENT: Negative for congestion.   Eyes: Negative for blurred vision.  Respiratory: Negative for cough.   Gastrointestinal: Positive for constipation. Negative for nausea and vomiting.  Musculoskeletal: Negative for arthralgias.  Neurological: Positive for vertigo.  All other systems reviewed and are negative.    Observations/Objective: No SOB or distress noted  Assessment and Plan: Maria Winters comes in today with chief complaint of No chief complaint on file.   Diagnosis and orders addressed:  1. Vertigo - CMP14+EGFR; Future - CBC with Differential/Platelet; Future - meclizine (ANTIVERT) 25 MG tablet; Take 1 tablet (25 mg total) by mouth 2 (two) times daily as needed for dizziness.  Dispense: 90 tablet; Refill: 4  2. Constipation, unspecified constipation type - CMP14+EGFR; Future - CBC with Differential/Platelet; Future - docusate sodium (COLACE) 100 MG capsule; Take 1 capsule (100 mg total) by mouth daily.  Dispense: 90 capsule; Refill: 2  3. Morbid obesity ([place]) - CMP14+EGFR; Future - CBC with Differential/Platelet; Future  4. Type 2 diabetes mellitus without complication, without long-term current use of insulin (HCC) - CMP14+EGFR; Future - CBC with Differential/Platelet; Future - Bayer DCA Hb A1c Waived;  Future - Lipid panel; Future -  Microalbumin / creatinine urine ratio; Future   Labs pending Health Maintenance reviewed Diet and exercise encouraged  Follow up plan: 6 months       I discussed the assessment and treatment plan with the patient. The patient was provided an opportunity to ask questions and all were answered. The patient agreed with the plan and demonstrated an understanding of the instructions.   The patient was advised to call back or seek an in-person evaluation if the symptoms worsen or if the condition fails to improve as anticipated.  The above assessment and management plan was discussed with the patient. The patient verbalized understanding of and has agreed to the management plan. Patient is aware to call the clinic if symptoms persist or worsen. Patient is aware when to return to the clinic for a follow-up visit. Patient educated on when it is appropriate to go to the emergency department.   Time call ended:  9:50 AM   I provided 17 minutes of non-face-to-face time during this encounter.    Evelina Dun, FNP

## 2019-08-20 ENCOUNTER — Other Ambulatory Visit: Payer: Self-pay | Admitting: Dermatology

## 2019-08-23 ENCOUNTER — Other Ambulatory Visit: Payer: Self-pay | Admitting: Dermatology

## 2019-08-24 ENCOUNTER — Other Ambulatory Visit: Payer: 59

## 2019-08-24 ENCOUNTER — Other Ambulatory Visit: Payer: Self-pay

## 2019-08-24 DIAGNOSIS — R42 Dizziness and giddiness: Secondary | ICD-10-CM

## 2019-08-24 DIAGNOSIS — K5900 Constipation, unspecified: Secondary | ICD-10-CM

## 2019-08-24 DIAGNOSIS — E119 Type 2 diabetes mellitus without complications: Secondary | ICD-10-CM

## 2019-08-24 LAB — BAYER DCA HB A1C WAIVED: HB A1C (BAYER DCA - WAIVED): 7.3 % — ABNORMAL HIGH (ref <7.0)

## 2019-08-25 LAB — CMP14+EGFR
ALT: 27 IU/L (ref 0–32)
AST: 15 IU/L (ref 0–40)
Albumin/Globulin Ratio: 1.2 (ref 1.2–2.2)
Albumin: 3.8 g/dL (ref 3.8–4.9)
Alkaline Phosphatase: 97 IU/L (ref 48–121)
BUN/Creatinine Ratio: 8 — ABNORMAL LOW (ref 12–28)
BUN: 7 mg/dL — ABNORMAL LOW (ref 8–27)
Bilirubin Total: 0.3 mg/dL (ref 0.0–1.2)
CO2: 22 mmol/L (ref 20–29)
Calcium: 8.6 mg/dL — ABNORMAL LOW (ref 8.7–10.3)
Chloride: 105 mmol/L (ref 96–106)
Creatinine, Ser: 0.88 mg/dL (ref 0.57–1.00)
GFR calc Af Amer: 83 mL/min/{1.73_m2} (ref >59)
GFR calc non Af Amer: 72 mL/min/{1.73_m2} (ref >59)
Globulin, Total: 3.3 g/dL (ref 1.5–4.5)
Glucose: 126 mg/dL — ABNORMAL HIGH (ref 65–99)
Potassium: 4.2 mmol/L (ref 3.5–5.2)
Sodium: 141 mmol/L (ref 134–144)
Total Protein: 7.1 g/dL (ref 6.0–8.5)

## 2019-08-25 LAB — CBC WITH DIFFERENTIAL/PLATELET
Basophils Absolute: 0.1 10*3/uL (ref 0.0–0.2)
Basos: 2 %
EOS (ABSOLUTE): 0.3 10*3/uL (ref 0.0–0.4)
Eos: 5 %
Hematocrit: 37.9 % (ref 34.0–46.6)
Hemoglobin: 12.6 g/dL (ref 11.1–15.9)
Immature Grans (Abs): 0 10*3/uL (ref 0.0–0.1)
Immature Granulocytes: 0 %
Lymphocytes Absolute: 1.5 10*3/uL (ref 0.7–3.1)
Lymphs: 28 %
MCH: 26.6 pg (ref 26.6–33.0)
MCHC: 33.2 g/dL (ref 31.5–35.7)
MCV: 80 fL (ref 79–97)
Monocytes Absolute: 0.4 10*3/uL (ref 0.1–0.9)
Monocytes: 7 %
Neutrophils Absolute: 3.1 10*3/uL (ref 1.4–7.0)
Neutrophils: 58 %
Platelets: 323 10*3/uL (ref 150–450)
RBC: 4.73 x10E6/uL (ref 3.77–5.28)
RDW: 13.3 % (ref 11.7–15.4)
WBC: 5.2 10*3/uL (ref 3.4–10.8)

## 2019-08-25 LAB — MICROALBUMIN / CREATININE URINE RATIO
Creatinine, Urine: 215.6 mg/dL
Microalb/Creat Ratio: 7 mg/g creat (ref 0–29)
Microalbumin, Urine: 15.4 ug/mL

## 2019-08-25 LAB — LIPID PANEL
Chol/HDL Ratio: 2.8 ratio (ref 0.0–4.4)
Cholesterol, Total: 164 mg/dL (ref 100–199)
HDL: 58 mg/dL (ref >39)
LDL Chol Calc (NIH): 94 mg/dL (ref 0–99)
Triglycerides: 61 mg/dL (ref 0–149)
VLDL Cholesterol Cal: 12 mg/dL (ref 5–40)

## 2019-08-27 ENCOUNTER — Other Ambulatory Visit: Payer: Self-pay | Admitting: Family

## 2019-08-27 ENCOUNTER — Telehealth: Payer: Self-pay | Admitting: Dermatology

## 2019-08-27 ENCOUNTER — Other Ambulatory Visit: Payer: Self-pay | Admitting: Dermatology

## 2019-08-27 MED ORDER — ATORVASTATIN CALCIUM 20 MG PO TABS: 20.0000 mg | ORAL_TABLET | Freq: Every day | ORAL | 3 refills | Status: DC

## 2019-08-27 NOTE — Telephone Encounter (Signed)
Patient left message on office voice mail requesting refill on Clobetasol.  Jannette uses [facility] in [place] as her pharmacy.  (Chart [id number])

## 2019-08-27 NOTE — Telephone Encounter (Signed)
Phone call to patient to inform her that she needs an appointment before any medications can be refilled.  Voicemail left for patient letting her know that she needs an appointment.

## 2019-08-28 ENCOUNTER — Telehealth: Payer: Self-pay | Admitting: *Deleted

## 2019-08-28 NOTE — Telephone Encounter (Signed)
Pt called to discuss results she had seen on MyChart and had questions, reviewed results and answered all questions.

## 2019-12-04 ENCOUNTER — Ambulatory Visit: Payer: 59 | Admitting: Dermatology

## 2019-12-04 ENCOUNTER — Encounter: Payer: Self-pay | Admitting: Dermatology

## 2019-12-04 ENCOUNTER — Other Ambulatory Visit: Payer: Self-pay

## 2019-12-04 DIAGNOSIS — L409 Psoriasis, unspecified: Secondary | ICD-10-CM | POA: Diagnosis not present

## 2019-12-04 MED ORDER — CLOBETASOL PROPIONATE 0.05 % EX OINT: TOPICAL_OINTMENT | CUTANEOUS | 5 refills | Status: DC

## 2019-12-07 ENCOUNTER — Other Ambulatory Visit: Payer: Self-pay

## 2019-12-07 ENCOUNTER — Ambulatory Visit (INDEPENDENT_AMBULATORY_CARE_PROVIDER_SITE_OTHER): Payer: 59 | Admitting: Family

## 2019-12-07 ENCOUNTER — Other Ambulatory Visit (HOSPITAL_COMMUNITY)
Admission: RE | Admit: 2019-12-07 | Discharge: 2019-12-07 | Disposition: A | Discharge status: Home / Self Care | Payer: 59 | Source: Ambulatory Visit | Attending: Family | Admitting: Family

## 2019-12-07 ENCOUNTER — Encounter: Payer: Self-pay | Admitting: Family

## 2019-12-07 VITALS — BP 137/81 | HR 84 | Temp 97.9°F | Ht 66.5 in | Wt 295.2 lb

## 2019-12-07 DIAGNOSIS — Z23 Encounter for immunization: Secondary | ICD-10-CM

## 2019-12-07 DIAGNOSIS — N939 Abnormal uterine and vaginal bleeding, unspecified: Secondary | ICD-10-CM | POA: Insufficient documentation

## 2019-12-07 DIAGNOSIS — R109 Unspecified abdominal pain: Secondary | ICD-10-CM | POA: Diagnosis not present

## 2019-12-07 LAB — URINALYSIS, COMPLETE
Bilirubin, UA: NEGATIVE
Glucose, UA: NEGATIVE
Ketones, UA: NEGATIVE
Leukocytes,UA: NEGATIVE
Nitrite, UA: NEGATIVE
Protein,UA: NEGATIVE
RBC, UA: NEGATIVE
Specific Gravity, UA: >1.03 — ABNORMAL HIGH (ref 1.005–1.030)
Urobilinogen, Ur: 0.2 mg/dL (ref 0.2–1.0)
pH, UA: 5.5 (ref 5.0–7.5)

## 2019-12-07 LAB — MICROSCOPIC EXAMINATION

## 2019-12-07 NOTE — Progress Notes (Signed)
° °  Subjective:    Patient ID: Maria Winters, female    DOB: [birth year], 59 y.o.   MRN: [id number]  Chief Complaint  Patient presents with   Vaginal Bleeding    started 09/09-09/11 dark brown when wipe 09/12 started lightening up   Pt presents to the office today with vaginal bleeding on 09/9 til the 09/11. She states she has not had a menstrual cycle since approx 59 years old. Denies any dysuria, discharge, vaginal itching. She reports she has mild abdominal cramping with the blood.  Vaginal Bleeding The patient's primary symptoms include vaginal bleeding. The patient's pertinent negatives include no genital itching, genital lesions, genital odor, missed menses, pelvic pain or vaginal discharge. This is a new problem. The problem occurs intermittently. The problem has been resolved. The patient is experiencing no pain. Associated symptoms include abdominal pain. Pertinent negatives include no chills, constipation, diarrhea, discolored urine, dysuria, fever, flank pain, headaches, joint pain, nausea, painful intercourse, urgency or vomiting. She has tried nothing for the symptoms. The treatment provided no relief.      Review of Systems  Constitutional: Negative for chills and fever.  Gastrointestinal: Positive for abdominal pain. Negative for constipation, diarrhea, nausea and vomiting.  Genitourinary: Positive for vaginal bleeding. Negative for dysuria, flank pain, missed menses, pelvic pain, urgency and vaginal discharge.  Musculoskeletal: Negative for joint pain.  Neurological: Negative for headaches.  All other systems reviewed and are negative.      Objective:   Physical Exam Constitutional:      Appearance: Normal appearance. She is obese.  HENT:     Right Ear: Tympanic membrane normal.     Left Ear: Tympanic membrane normal.  Genitourinary:    General: Normal vulva.     Comments: Bimanual exam- no adnexal masses or tenderness, ovaries nonpalpable   Cervix parous and  pink- No discharge  Musculoskeletal:        General: Normal range of motion.  Skin:    General: Skin is warm and dry.  Neurological:     General: No focal deficit present.     Mental Status: She is alert and oriented to person, place, and time.  Psychiatric:        Mood and Affect: Mood normal.       BP 137/81    Pulse 84    Temp 97.9 F (36.6 C) (Temporal)    Ht 5' 6.5" (1.689 m)    Wt 295 lb 3.2 oz (133.9 kg)    LMP 12/07/1999 (Approximate)    BMI 46.93 kg/m      Assessment & Plan:  [name] comes in today with chief complaint of Vaginal Bleeding (started 09/09-09/11 dark brown when wipe 09/12 started lightening up)   Diagnosis and orders addressed:  1. Vagina bleeding - Urinalysis, Complete - Urine Culture - Cytology - PAP([place]) - US Pelvic Complete With Transvaginal; Future  2. Abdominal cramping - Urinalysis, Complete - Urine Culture - Cytology - PAP([place])  3. Morbid obesity ([place])  4. Need for immunization against influenza - Flu Vaccine QUAD 36+ mos IM   Will do pap and order transvaginal US today, if bleeding returns will need to send to GYN for biospy  Health Maintenance reviewed Diet and exercise encouraged  Follow up plan: 3 months    Evelina Dun, FNP

## 2019-12-07 NOTE — Patient Instructions (Signed)
Abnormal Uterine Bleeding °Abnormal uterine bleeding is unusual bleeding from the uterus. It includes: °· Bleeding or spotting between periods. °· Bleeding after sex. °· Bleeding that is heavier than normal. °· Periods that last longer than usual. °· Bleeding after menopause. °Abnormal uterine bleeding can affect women at various stages in life, including teenagers, women in their reproductive years, pregnant women, and women who have reached menopause. Common causes of abnormal uterine bleeding include: °· Pregnancy. °· Growths of tissue (polyps). °· A noncancerous tumor in the uterus (fibroid). °· Infection. °· Cancer. °· Hormonal imbalances. °Any type of abnormal bleeding should be evaluated by a health care provider. Many cases are minor and simple to treat, while others are more serious. Treatment will depend on the cause of the bleeding. °Follow these instructions at home: °· Monitor your condition for any changes. °· Do not use tampons, douche, or have sex if told by your health care provider. °· Change your pads often. °· Get regular exams that include pelvic exams and cervical cancer screening. °· Keep all follow-up visits as told by your health care provider. This is important. °Contact a health care provider if: °· Your bleeding lasts for more than one week. °· You feel dizzy at times. °· You feel nauseous or you vomit. °Get help right away if: °· You pass out. °· Your bleeding soaks through a pad every hour. °· You have abdominal pain. °· You have a fever. °· You become sweaty or weak. °· You pass large blood clots from your vagina. °Summary °· Abnormal uterine bleeding is unusual bleeding from the uterus. °· Any type of abnormal bleeding should be evaluated by a health care provider. Many cases are minor and simple to treat, while others are more serious. °· Treatment will depend on the cause of the bleeding. °This information is not intended to replace advice given to you by your health care provider.  Make sure you discuss any questions you have with your health care provider. °Document Revised: 06/01/2017 Document Reviewed: 03/26/2016 °Elsevier Patient Education © 2020 Elsevier Inc. ° °

## 2019-12-10 ENCOUNTER — Other Ambulatory Visit: Payer: Self-pay | Admitting: Family

## 2019-12-10 DIAGNOSIS — Z1231 Encounter for screening mammogram for malignant neoplasm of breast: Secondary | ICD-10-CM

## 2019-12-10 LAB — URINE CULTURE

## 2019-12-11 ENCOUNTER — Telehealth: Payer: Self-pay | Admitting: Family

## 2019-12-12 LAB — CYTOLOGY - PAP
Adequacy: ABSENT
Chlamydia: NEGATIVE
Comment: NEGATIVE
Comment: NORMAL
Diagnosis: NEGATIVE
Diagnosis: REACTIVE
Neisseria Gonorrhea: NEGATIVE

## 2019-12-13 ENCOUNTER — Telehealth: Payer: Self-pay | Admitting: Family

## 2019-12-14 ENCOUNTER — Ambulatory Visit (HOSPITAL_COMMUNITY): Admission: RE | Admit: 2019-12-14 | Payer: 59 | Source: Ambulatory Visit

## 2019-12-14 ENCOUNTER — Telehealth: Payer: Self-pay

## 2019-12-14 ENCOUNTER — Telehealth: Payer: Self-pay | Admitting: Family

## 2019-12-14 NOTE — Telephone Encounter (Signed)
Pt called stating that she doesn't get off work until [employer] today and doesn't have anyone to cover for her at work, so she won't be able to come to her [place] appt today at 2:30. Wants to know if it can be moved to 4:15 or later or can someone r/s it for different day and later time.

## 2019-12-20 ENCOUNTER — Ambulatory Visit (HOSPITAL_COMMUNITY)
Admission: RE | Admit: 2019-12-20 | Discharge: 2019-12-20 | Disposition: A | Discharge status: Home / Self Care | Payer: 59 | Source: Ambulatory Visit | Attending: Family | Admitting: Family

## 2019-12-20 ENCOUNTER — Other Ambulatory Visit: Payer: Self-pay

## 2019-12-20 DIAGNOSIS — N939 Abnormal uterine and vaginal bleeding, unspecified: Secondary | ICD-10-CM | POA: Diagnosis not present

## 2019-12-27 ENCOUNTER — Encounter: Payer: Self-pay | Admitting: Family Medicine

## 2020-01-02 ENCOUNTER — Telehealth: Payer: Self-pay

## 2020-01-02 DIAGNOSIS — N939 Abnormal uterine and vaginal bleeding, unspecified: Secondary | ICD-10-CM

## 2020-01-02 NOTE — Telephone Encounter (Signed)
Patient states that Alyse Low told her to call back if she was not any better.  States that she is having worsening bleeding.  States she has the bloating and headaches that she used to have with her menstrual cycle.  Patient would like to know if Alyse Low thinks it will get better or what she should do?

## 2020-01-02 NOTE — Telephone Encounter (Signed)
lmtcb

## 2020-01-03 NOTE — Telephone Encounter (Signed)
This is abnormal. Your [place] and pap were normal. I have placed a referral to GYN for further investigation.

## 2020-01-03 NOTE — Telephone Encounter (Signed)
Left message to call back  

## 2020-01-03 NOTE — Progress Notes (Signed)
   Follow-Up Visit   Subjective  Maria Winters is a 59 y.o. female who presents for the following: Medication Refill (clobetasol paoriasis v/s lichen planus, gum pain legs arms neck).  Follow up Location:  Duration:  Quality:  Associated Signs/Symptoms: Modifying Factors:  Severity:  Timing: Context:   Objective  Well appearing patient in no apparent distress; mood and affect are within normal limits.  All skin waist up examined.   Assessment & Plan    Psoriasis (5) Neck - Anterior; Left Upper Arm - Anterior; Right Upper Arm - Anterior; Left Lower Leg - Anterior; Right Lower Leg - Anterior  History of clobetasol helps control the process.  Reminded not to use this on face or body folds and ideally, when doing well, should be used less than every day.  clobetasol ointment (TEMOVATE) 0.05 % - Left Lower Leg - Anterior, Left Upper Arm - Anterior, Neck - Anterior, Right Lower Leg - Anterior, Right Upper Arm - Anterior     I, Lavonna Monarch, MD, have reviewed all documentation for this visit.  The documentation on 01/07/20 for the exam, diagnosis, procedures, and orders are all accurate and complete.

## 2020-01-04 ENCOUNTER — Encounter: Payer: Self-pay | Admitting: Nurse Practitioner

## 2020-01-04 ENCOUNTER — Ambulatory Visit (INDEPENDENT_AMBULATORY_CARE_PROVIDER_SITE_OTHER): Payer: 59

## 2020-01-04 ENCOUNTER — Ambulatory Visit: Payer: 59 | Admitting: Nurse Practitioner

## 2020-01-04 ENCOUNTER — Other Ambulatory Visit: Payer: Self-pay

## 2020-01-04 VITALS — BP 142/78 | HR 80 | Temp 97.6°F | Ht 66.5 in | Wt 290.4 lb

## 2020-01-04 DIAGNOSIS — M79641 Pain in right hand: Secondary | ICD-10-CM

## 2020-01-04 DIAGNOSIS — M778 Other enthesopathies, not elsewhere classified: Secondary | ICD-10-CM

## 2020-01-04 MED ORDER — PREDNISONE 20 MG PO TABS: ORAL_TABLET | ORAL | 0 refills | Status: DC

## 2020-01-04 NOTE — Patient Instructions (Signed)
Tendinitis  Tendinitis is swelling (inflammation) of a tendon. A tendon is a cord of tissue that connects muscle to bone. Tendinitis can cause pain, tenderness, and swelling. What are the causes?  Using a tendon or muscle too much (overuse). This is a common cause.  Wear and tear that happens as you age.  Injury.  Some medical conditions, such as arthritis.  Some medicines. What increases the risk? You are more likely to get this condition if you do activities that involve the same movements over and over again (repetitive motions). What are the signs or symptoms?  Pain.  Tenderness.  Mild swelling.  Decreased range of motion. How is this treated? This condition is usually treated with RICE therapy. RICE stands for:  Rest.  Ice.  Compression. This means putting pressure on the affected area.  Elevation. This means raising the affected area above the level of your heart. Treatment may also include:  Medicines for swelling or pain.  Exercises or physical therapy.  A brace or splint.  Surgery. This is rarely needed. Follow these instructions at home: If you have a splint or brace:  Wear the splint or brace as told by your doctor. Remove it only as told by your doctor.  Loosen the splint or brace if your fingers or toes: ? Tingle. ? Become numb. ? Turn cold and blue.  Keep the splint or brace clean.  If the splint or brace is not waterproof: ? Do not let it get wet. ? Cover it with a watertight covering when you take a bath or shower. Managing pain, stiffness, and swelling      If told, put ice on the affected area. ? If you have a removable splint or brace, remove it as told by your doctor. ? Put ice in a plastic bag. ? Place a towel between your skin and the bag. ? Leave the ice on for 20 minutes, 2-3 times a day.  Move the fingers or toes of the affected arm or leg often, if this applies. This helps to prevent stiffness and to lessen  swelling.  If told, raise the affected area above the level of your heart while you are sitting or lying down.  If told, put heat on the affected area before you exercise. Use the heat source that your doctor recommends, such as a moist heat pack or a heating pad. ? Place a towel between your skin and the heat source. ? Leave the heat on for 20-30 minutes. ? Remove the heat if your skin turns bright red. This is very important if you are unable to feel pain, heat, or cold. You may have a greater risk of getting burned. Driving  Do not drive or use heavy machinery while taking prescription pain medicine.  Ask your doctor when it is safe to drive if you have a splint or brace on any part of your arm or leg. Activity  Rest the affected area as told by your doctor.  Return to your normal activities as told by your doctor. Ask your doctor what activities are safe for you.  Avoid using the affected area while you have symptoms.  Do exercises as told by your doctor. General instructions  If you have a splint, do not put pressure on any part of the splint until it is fully hardened. This may take several hours.  Wear an elastic bandage or pressure (compression) wrap only as told by your doctor.  Take over-the-counter and prescription medicines only as  told by your doctor.  Keep all follow-up visits as told by your doctor. This is important. Contact a doctor if:  You do not get better.  You get new problems, such as numbness in your hands, and you do not know why. Summary  Tendinitis is swelling (inflammation) of a tendon.  You are more likely to get this condition if you do activities that involve the same movements over and over again.  This condition is usually treated with RICE therapy. RICE stands for rest, ice, compression, and elevate.  Avoid using the affected area while you have symptoms. This information is not intended to replace advice given to you by your health care  provider. Make sure you discuss any questions you have with your health care provider. Document Revised: 08/30/2017 Document Reviewed: 07/13/2017 Elsevier Patient Education  [place].

## 2020-01-04 NOTE — Progress Notes (Signed)
   Subjective:    Patient ID: Maria Winters, female    DOB: 04-21-1960, 59 y.o.   MRN: [id number]   Chief Complaint: Hand Pain (Right)   HPI Patient come sin today c/o right hand pain at base of right thumb. It has gotten some better but still hurts. Slightly swollen. She deneis any injury. She types a lot at work but not unusual for her. Pain was really bad for 3 days 8-9/10 but is now 5/10. She took some aleve but she does not think that that helped.   Review of Systems  Musculoskeletal: Positive for arthralgias (right thumb).       Objective:   Physical Exam Vitals reviewed.  Cardiovascular:     Rate and Rhythm: Normal rate and regular rhythm.     Heart sounds: Normal heart sounds.  Pulmonary:     Effort: Pulmonary effort is normal.     Breath sounds: Normal breath sounds.  Musculoskeletal:     Comments: Decreased ROM of right thumb No opposition to ring or pinky finger Mild tenderness on palpation at base of right thumb. Grip weak due to pain  Skin:    General: Skin is warm.  Neurological:     General: No focal deficit present.     Mental Status: She is oriented to person, place, and time.  Psychiatric:        Mood and Affect: Mood normal.        Behavior: Behavior normal.    BP (!) 142/78   Pulse 80   Temp 97.6 F (36.4 C) (Temporal)   Ht 5' 6.5" (1.689 m)   Wt 290 lb 6.4 oz (131.7 kg)   BMI 46.17 kg/m     Right and xray- normal-Preliminary reading by Ronnald Collum, FNP  [facility]     Assessment & Plan:  Maria Winters in today with chief complaint of Hand Pain (Right)   1. Pain of right hand  2. Thumb tendonitis Moist heat Rest If no better in 2-3 days let me know - DG Hand Complete Right; Future - predniSONE (DELTASONE) 20 MG tablet; 2 po at sametime daily for 5 days  Dispense: 10 tablet; Refill: 0    The above assessment and management plan was discussed with the patient. The patient verbalized understanding of and has agreed to the  management plan. Patient is aware to call the clinic if symptoms persist or worsen. Patient is aware when to return to the clinic for a follow-up visit. Patient educated on when it is appropriate to go to the emergency department.   Mary-Margaret Hassell Done, FNP

## 2020-01-07 ENCOUNTER — Encounter: Payer: Self-pay | Admitting: Dermatology

## 2020-01-10 ENCOUNTER — Other Ambulatory Visit (HOSPITAL_COMMUNITY): Payer: Self-pay | Admitting: Family

## 2020-01-10 DIAGNOSIS — Z1231 Encounter for screening mammogram for malignant neoplasm of breast: Secondary | ICD-10-CM

## 2020-01-14 ENCOUNTER — Telehealth: Payer: Self-pay

## 2020-01-14 NOTE — Telephone Encounter (Signed)
Left message to call back  

## 2020-01-18 ENCOUNTER — Encounter: Payer: Self-pay | Admitting: Family

## 2020-01-18 ENCOUNTER — Ambulatory Visit (INDEPENDENT_AMBULATORY_CARE_PROVIDER_SITE_OTHER): Payer: 59 | Admitting: Family

## 2020-01-18 DIAGNOSIS — N939 Abnormal uterine and vaginal bleeding, unspecified: Secondary | ICD-10-CM

## 2020-01-18 DIAGNOSIS — N950 Postmenopausal bleeding: Secondary | ICD-10-CM

## 2020-01-18 NOTE — Progress Notes (Signed)
° °  Virtual Visit via telephone Note Due to COVID-19 pandemic this visit was conducted virtually. This visit type was conducted due to national recommendations for restrictions regarding the COVID-19 Pandemic (e.g. social distancing, sheltering in place) in an effort to limit this patient's exposure and mitigate transmission in our community. All issues noted in this document were discussed and addressed.  A physical exam was not performed with this format.  I connected with Maria Winters on 01/18/20 at 1:44 pm by telephone and verified that I am speaking with the correct person using two identifiers. Maria Winters is currently located at home and no one is currently with her during visit. The provider, Evelina Dun, FNP is located in their office at time of visit.  I discussed the limitations, risks, security and privacy concerns of performing an evaluation and management service by telephone and the availability of in person appointments. I also discussed with the patient that there may be a patient responsible charge related to this service. The patient expressed understanding and agreed to proceed.   History and Present Illness:  HPI  PT calls the office today for post menopause bleeding. She was seen on 12/07/19 and had a pap. This was negative and we set her up with US pelvic with transvaginal. It showed "IMPRESSION: 1. No acute findings. 2. Four discrete small uterine fibroids, mural to subserosal in the location. None appear submucosal. 3. Normal thickness endometrium.  No endometrial masses or fluid. 4. Normal right ovary. Left ovary not visualized. No adnexal masses. No pelvic free fluid."  She reports she continues to have bleeding on and off. She is currently still bleeding, but has tampered to scant amount. We placed a referral to GYN 01/03/20, but this was closed because she did not return their phone call.   Review of Systems  All other systems reviewed and are  negative.    Observations/Objective: No SOB or distress noted, very pleasant   Assessment and Plan: 1. Abnormal uterine bleeding - Ambulatory referral to Gynecology  2. Postmenopausal bleeding - Ambulatory referral to Gynecology  PT states she does not check her cell phone messages regularly. She will answer all calls next week to schedule appt with GYN.  [place] reviewed with patient    I discussed the assessment and treatment plan with the patient. The patient was provided an opportunity to ask questions and all were answered. The patient agreed with the plan and demonstrated an understanding of the instructions.   The patient was advised to call back or seek an in-person evaluation if the symptoms worsen or if the condition fails to improve as anticipated.  The above assessment and management plan was discussed with the patient. The patient verbalized understanding of and has agreed to the management plan. Patient is aware to call the clinic if symptoms persist or worsen. Patient is aware when to return to the clinic for a follow-up visit. Patient educated on when it is appropriate to go to the emergency department.   Time call ended:  1:58 pm   I provided 14 minutes of non-face-to-face time during this encounter.    Evelina Dun, FNP

## 2020-01-21 ENCOUNTER — Other Ambulatory Visit: Payer: Self-pay

## 2020-01-21 ENCOUNTER — Ambulatory Visit (HOSPITAL_COMMUNITY)
Admission: RE | Admit: 2020-01-21 | Discharge: 2020-01-21 | Disposition: A | Discharge status: Home / Self Care | Payer: 59 | Source: Ambulatory Visit | Attending: Family | Admitting: Family

## 2020-01-21 ENCOUNTER — Encounter (HOSPITAL_COMMUNITY): Payer: Self-pay

## 2020-01-21 DIAGNOSIS — Z1231 Encounter for screening mammogram for malignant neoplasm of breast: Secondary | ICD-10-CM | POA: Diagnosis not present

## 2020-01-22 ENCOUNTER — Encounter: Payer: Self-pay | Admitting: Obstetrics and Gynecology

## 2020-01-22 NOTE — Telephone Encounter (Signed)
After multiple attempts to contact patient without success, encounter will be closed.

## 2020-03-05 ENCOUNTER — Encounter: Payer: Self-pay | Admitting: Obstetrics and Gynecology

## 2020-03-05 ENCOUNTER — Other Ambulatory Visit (HOSPITAL_COMMUNITY)
Admission: RE | Admit: 2020-03-05 | Discharge: 2020-03-05 | Disposition: A | Discharge status: Home / Self Care | Payer: 59 | Source: Ambulatory Visit | Attending: Obstetrics and Gynecology | Admitting: Obstetrics and Gynecology

## 2020-03-05 ENCOUNTER — Ambulatory Visit: Payer: 59 | Admitting: Obstetrics and Gynecology

## 2020-03-05 ENCOUNTER — Other Ambulatory Visit: Payer: Self-pay

## 2020-03-05 VITALS — BP 130/78 | HR 85 | Ht 65.0 in | Wt 287.0 lb

## 2020-03-05 DIAGNOSIS — N950 Postmenopausal bleeding: Secondary | ICD-10-CM | POA: Insufficient documentation

## 2020-03-05 DIAGNOSIS — R9389 Abnormal findings on diagnostic imaging of other specified body structures: Secondary | ICD-10-CM

## 2020-03-05 DIAGNOSIS — D219 Benign neoplasm of connective and other soft tissue, unspecified: Secondary | ICD-10-CM

## 2020-03-05 NOTE — Progress Notes (Signed)
GYNECOLOGY  VISIT   HPI: 59 y.o.   Single  African American  female   [id number] with Patient's last menstrual period was 02/20/2020.   here for evaluation of postmenopausal bleeding and fibroids, which were noted on pelvic ultrasound.  Patient's last menstrual cycle was 2005 but began bleeding again 11/2019. She has bleeding off and on since then.   Bleeding can occur every 2 weeks.  She is having backache, headaches, stomach bloating.  She also notes an odor, whether she is bleeding or not, since the bleeding started.    No HRT currently.  She did take Premarin over 20 years ago.   Not sexually active for years.   She did have negative GC/CT on 12/07/19 with her pap, which was normal.   Her pelvic ultrasound was done 12/20/19. CLINICAL DATA:  Abnormal uterine bleeding.  EXAM: TRANSABDOMINAL AND TRANSVAGINAL ULTRASOUND OF PELVIS  TECHNIQUE: Both transabdominal and transvaginal ultrasound examinations of the pelvis were performed. Transabdominal technique was performed for global imaging of the pelvis including uterus, ovaries, adnexal regions, and pelvic cul-de-sac. It was necessary to proceed with endovaginal exam following the transabdominal exam to visualize the uterus, endometrium and ovaries to better advantage.  COMPARISON:  None  FINDINGS: Uterus  Measurements: 8.3 x 4.4 x 5.9 cm = volume: 112 mL. Several uterine masses, with 4 discrete masses visualized, consistent with fibroids. In the right posterior upper uterine segment/fundus there is a 1.9 x 1.4 x 1.4 cm mass. Directly adjacent to this is a smaller similar appearing mass measuring 1.2 x 1.2 x 1.2 cm. Just inferior to this is a third smaller mass measuring 8 x 6 x 8 mm. On the left there is a posterior upper uterine segment/fundal fibroid measuring 1.5 x 1.4 x 1.8 cm. These are all mural to subserosal in location. None appear submucosal.  Endometrium  Thickness: 7 mm.  No focal abnormality  visualized.  Right ovary  Measurements: 2.0 x 1.3 x 1.2 cm = volume: 1.7 mL. Normal appearance/no adnexal mass.  Left ovary  Left ovary not visualized.  No left adnexal masses.  Other findings  No abnormal free fluid.  IMPRESSION: 1. No acute findings. 2. Four discrete small uterine fibroids, mural to subserosal in the location. None appear submucosal. 3. Normal thickness endometrium.  No endometrial masses or fluid. 4. Normal right ovary. Left ovary not visualized. No adnexal masses. No pelvic free fluid.   Electronically Signed   By: Amie Portland M.D.   On: 12/22/2019 16:41   GYNECOLOGIC HISTORY: Patient's last menstrual period was 02/20/2020. Contraception:  PMP Menopausal hormone therapy:  none Last mammogram:  01-21-20 3D/Neg/density A/BiRads1 Last pap smear: 12-07-19 Neg, 08-31-16 Neg  Hx of LEEP and ?conization to cervix in her 20's--paps normal since.        OB History    Gravida  0   Para  0   Term  0   Preterm  0   AB  0   Living  0     SAB  0   IAB  0   Ectopic  0   Multiple  0   Live Births  0              Patient Active Problem List   Diagnosis Date Noted  . Vertigo 07/23/2019  . Constipation 07/23/2019  . Anxiety state 03/18/2016  . Migraine equivalent 03/18/2016  . Morbid obesity (HCC) 06/06/2015  . Type 2 diabetes mellitus without complication, without long-term current use of  insulin (HCC) 06/06/2015  . Elevated blood pressure 05/09/2015  . S/P lap cholecystectomy July 2014 09/14/2012  . Chest pain, unspecified 04/28/2012  . GERD (gastroesophageal reflux disease) 04/28/2012    Past Medical History:  Diagnosis Date  . Abnormal Pap smear of cervix    in her 20's-- hx of conization/LEEP procedure-normal paps since  . Abnormal uterine bleeding   . Anemia    ~2005  . Arthritis   . Chest pain   . Fibroid   . GERD (gastroesophageal reflux disease)   . Helicobacter pylori (H. pylori)   . Psoriasis   .  Reflux   . STD (sexually transmitted disease) 1994   Tx'd for Chlamydia  . Vision abnormalities    tunnel vision    Past Surgical History:  Procedure Laterality Date  . cervix dilated    . CHOLECYSTECTOMY N/A 09/13/2012   Procedure: LAPAROSCOPIC CHOLECYSTECTOMY WITH INTRAOPERATIVE CHOLANGIOGRAM;  Surgeon: Valarie Merino, MD;  Location: [facility];  Service: General;  Laterality: N/A;  . DILATION AND CURETTAGE OF UTERUS    . DILATION AND CURETTAGE, DIAGNOSTIC / THERAPEUTIC  1989   with Conization  . ESOPHAGOGASTRODUODENOSCOPY N/A 05/11/2012   Procedure: ESOPHAGOGASTRODUODENOSCOPY (EGD);  Surgeon: Malissa Hippo, MD;  [facility]: [facility];  Service: Endoscopy;  Laterality: N/A;  125  . LEEP      Current Outpatient Medications  Medication Sig Dispense Refill  . atorvastatin (LIPITOR) 20 MG tablet Take 1 tablet (20 mg total) by mouth daily. 90 tablet 3  . clobetasol ointment (TEMOVATE) 0.05 % Apply topically at affected area daily until clear 30 g 5  . dexamethasone 0.5 MG/5ML elixir Take 0.5 mg by mouth 4 (four) times daily.    Marland Kitchen docusate sodium (COLACE) 100 MG capsule Take 1 capsule (100 mg total) by mouth daily. 90 capsule 2  . fluocinonide gel (LIDEX) 0.05 % Apply topically 4 (four) times daily.    . meclizine (ANTIVERT) 25 MG tablet Take 1 tablet (25 mg total) by mouth 2 (two) times daily as needed for dizziness. 90 tablet 4   No current facility-administered medications for this visit.     ALLERGIES: Esomeprazole magnesium  Family History  Problem Relation Age of Onset  . Diabetes Mother   . Hypertension Father   . Healthy Sister   . Hypertension Sister   . Hypertension Brother   . Gout Brother   . Heart attack Brother   . Breast cancer Sister 66       lumpectomy  . Sickle cell trait Brother   . Diabetes Maternal Grandmother        with 2 BKA  . Stroke Maternal Grandmother   . Breast cancer Maternal Aunt     Social History   Socioeconomic History  . Marital  status: Single    Spouse name: Not on file  . Number of children: Not on file  . Years of education: Not on file  . Highest education level: Not on file  Occupational History  . Not on file  Tobacco Use  . Smoking status: Former Smoker    Packs/day: 0.25    Years: 10.00    Pack years: 2.50    Types: Cigarettes    Quit date: 06/20/1992    Years since quitting: 27.7  . Smokeless tobacco: Never Used  . Tobacco comment: Quit in 1995  Vaping Use  . Vaping Use: Never used  Substance and Sexual Activity  . Alcohol use: Not Currently    Alcohol/week:  1.0 standard drink    Types: 1 Glasses of wine per week    Comment: Patient may drink 2 glasses of wine a year  . Drug use: No  . Sexual activity: Never    Birth control/protection: Abstinence  Other Topics Concern  . Not on file  Social History Narrative  . Not on file   Social Determinants of Health   Financial Resource Strain: Not on file  Food Insecurity: Not on file  Transportation Needs: Not on file  Physical Activity: Not on file  Stress: Not on file  Social Connections: Not on file  Intimate Partner Violence: Not on file    Review of Systems  All other systems reviewed and are negative.   PHYSICAL EXAMINATION:    BP 130/78 (Cuff Size: Large)   Pulse 85   Ht 5\' 5"  (1.651 m)   Wt 287 lb (130.2 kg)   LMP 02/20/2020   SpO2 98%   BMI 47.76 kg/m     General appearance: alert, cooperative and appears stated age Head: Normocephalic, without obvious abnormality, atraumatic Neck: no adenopathy, supple, symmetrical, trachea midline and thyroid normal to inspection and palpation Lungs: clear to auscultation bilaterally Heart: regular rate and rhythm Abdomen: soft, non-tender, no masses,  no organomegaly No abnormal inguinal nodes palpated Neurologic: Grossly normal  Pelvic: External genitalia:  no lesions              Urethra:  normal appearing urethra with no masses, tenderness or lesions              Bartholins  and Skenes: normal                 Vagina: normal appearing vagina with normal color and discharge, no lesions              Cervix: no lesions.  Small amount of dark blood at the os.                  Bimanual Exam:  Uterus:  normal size, contour, position, consistency, mobility, non-tender              Adnexa: no mass, fullness, tenderness   Endometrial biopsy Consent for procedure.  Sterile prep with Hibiclens. Paracervical block with 10 cc 1% lidocaine, lot [id number], expiration July, 2025. Os finder used.  Pipelle placed to 7 cm x 2.  Tissue to pathology.  No complications.  Minimal EBL.            Chaperone was present for exam.  ASSESSMENT  Postmenopausal bleeding.  Thickened endometrium.  Fibroids. Hx conization and dilation and curettage.  Hx LEEP.  FH of breast cancer in sister and maternal aunt.   PLAN  We discussed post menopausal bleeding and possible etiologies - atrophy, polyp, precancer, and cancer.  [place] images and report reviewed.  FU EMB.  Post EMB precautions given.  Final plan to follow.    45 min  total time was spent for this patient encounter, including preparation, face-to-face counseling with the patient, coordination of care, and documentation of the encounter.

## 2020-03-05 NOTE — Patient Instructions (Signed)
Endometrial Biopsy, Care After This sheet gives you information about how to care for yourself after your procedure. Your health care provider may also give you more specific instructions. If you have problems or questions, contact your health care provider. What can I expect after the procedure? After the procedure, it is common to have:  Mild cramping.  A small amount of vaginal bleeding for a few days. This is normal. Follow these instructions at home:   Take over-the-counter and prescription medicines only as told by your health care provider.  Do not douche, use tampons, or have sexual intercourse until your health care provider approves.  Return to your normal activities as told by your health care provider. Ask your health care provider what activities are safe for you.  Follow instructions from your health care provider about any activity restrictions, such as restrictions on strenuous exercise or heavy lifting. Contact a health care provider if:  You have heavy bleeding, or bleed for longer than 2 days after the procedure.  You have bad smelling discharge from your vagina.  You have a fever or chills.  You have a burning sensation when urinating or you have difficulty urinating.  You have severe pain in your lower abdomen. Get help right away if:  You have severe cramps in your stomach or back.  You pass large blood clots.  Your bleeding increases.  You become weak or light-headed, or you pass out. Summary  After the procedure, it is common to have mild cramping and a small amount of vaginal bleeding for a few days.  Do not douche, use tampons, or have sexual intercourse until your health care provider approves.  Return to your normal activities as told by your health care provider. Ask your health care provider what activities are safe for you. This information is not intended to replace advice given to you by your health care provider. Make sure you discuss any  questions you have with your health care provider. Document Revised: 02/04/2017 Document Reviewed: 03/10/2016 Elsevier Patient Education  2020 Elsevier Inc. Postmenopausal Bleeding  Postmenopausal bleeding is any bleeding that a woman has after she has entered into menopause. Menopause is the end of a woman's fertile years. After menopause, a woman no longer ovulates and does not have menstrual periods. Postmenopausal bleeding may have various causes, including:  Menopausal hormone therapy (MHT).  Endometrial atrophy. After menopause, low estrogen hormone levels cause the membrane that lines the uterus (endometrium) to become thinner. You may have bleeding as the endometrium thins.  Endometrial hyperplasia. This condition is caused by excess estrogen hormones and low levels of progesterone hormones. The excess estrogen causes the endometrium to thicken, which can lead to bleeding. In some cases, this can lead to cancer of the uterus.  Endometrial cancer.  Non-cancerous growths (polyps) on the endometrium, the lining of the uterus, or the cervix.  Uterine fibroids. These are non-cancerous growths in or around the uterus muscle tissue that can cause heavy bleeding. Any type of postmenopausal bleeding, even if it appears to be a typical menstrual period, should be evaluated by your health care provider. Treatment will depend on the cause of the bleeding. Follow these instructions at home:  Pay attention to any changes in your symptoms.  Avoid using tampons and douches as told by your health care provider.  Change your pads regularly.  Get regular pelvic exams and Pap tests.  Take iron supplements as told by your health care provider.  Take over-the-counter and prescription medicines   only as told by your health care provider.  Keep all follow-up visits as told by your health care provider. This is important. Contact a health care provider if:  Your bleeding lasts more than 1  week.  You have abdominal pain.  You have bleeding with or after sexual intercourse.  You have bleeding that happens more often than every 3 weeks. Get help right away if:  You have a fever, chills, headache, dizziness, muscle aches, and bleeding.  You have severe pain with bleeding.  You are passing blood clots.  You have heavy bleeding, need more than 1 pad an hour, and have never experienced this before.  You feel faint. Summary  Postmenopausal bleeding is any bleeding that a woman has after she has entered into menopause.  Postmenopausal bleeding may have various causes. Treatment will depend on the cause of the bleeding.  Any type of postmenopausal bleeding, even if it appears to be a typical menstrual period, should be evaluated by your health care provider.  Be sure to pay attention to any changes in your symptoms and keep all follow-up visits as told by your health care provider. This information is not intended to replace advice given to you by your health care provider. Make sure you discuss any questions you have with your health care provider. Document Revised: 06/01/2017 Document Reviewed: 05/18/2016 Elsevier Patient Education  2020 Elsevier Inc.  

## 2020-03-10 LAB — SURGICAL PATHOLOGY

## 2020-03-14 ENCOUNTER — Ambulatory Visit: Payer: 59 | Admitting: Family

## 2020-03-17 ENCOUNTER — Other Ambulatory Visit: Payer: Self-pay

## 2020-03-17 DIAGNOSIS — N950 Postmenopausal bleeding: Secondary | ICD-10-CM

## 2020-04-07 ENCOUNTER — Ambulatory Visit (INDEPENDENT_AMBULATORY_CARE_PROVIDER_SITE_OTHER): Payer: 59 | Admitting: Family Medicine

## 2020-04-07 ENCOUNTER — Encounter: Payer: Self-pay | Admitting: Family Medicine

## 2020-04-07 VITALS — BP 124/80 | HR 80 | Temp 97.9°F

## 2020-04-07 DIAGNOSIS — R6883 Chills (without fever): Secondary | ICD-10-CM | POA: Diagnosis not present

## 2020-04-07 DIAGNOSIS — R059 Cough, unspecified: Secondary | ICD-10-CM | POA: Diagnosis not present

## 2020-04-07 DIAGNOSIS — R0981 Nasal congestion: Secondary | ICD-10-CM

## 2020-04-07 LAB — VERITOR FLU A/B WAIVED
Influenza A: NEGATIVE
Influenza B: NEGATIVE

## 2020-04-07 MED ORDER — FLUTICASONE PROPIONATE 50 MCG/ACT NA SUSP: 2.0000 | Freq: Every day | NASAL | 6 refills | Status: DC

## 2020-04-07 MED ORDER — BENZONATATE 100 MG PO CAPS: 100.0000 mg | ORAL_CAPSULE | Freq: Three times a day (TID) | ORAL | 0 refills | Status: DC | PRN

## 2020-04-07 NOTE — Progress Notes (Signed)
Established Patient Office Visit  Subjective:  Patient ID: Maria Winters, female    DOB: Oct 12, 1960  Age: 60 y.o. MRN: [id number]  CC:  Chief Complaint  Patient presents with  . Cough    Cough, headaches, chills, sore throat x 2 days    HPI Maria Winters presents for headaches for 2 days. The headache feels like pressure and is frontal. The headache is intermittent. She also reports some mild nasal congestion, cough with white mucus, and scratchy throat. Denies fever, body aches, nausea, or vomiting. She has had chills. She has had her Covid vaccines and booster. She has had her flu vaccine as well. Someone at work has Covid but she is not around them really. Denies chest pain or shortness of breath.   Past Medical History:  Diagnosis Date  . Abnormal Pap smear of cervix    in her 20's-- hx of conization/LEEP procedure-normal paps since  . Abnormal uterine bleeding   . Anemia    ~2005  . Arthritis   . Chest pain   . Fibroid   . GERD (gastroesophageal reflux disease)   . Helicobacter pylori (H. pylori)   . Psoriasis   . Reflux   . STD (sexually transmitted disease) 1994   Tx'd for Chlamydia  . Vision abnormalities    tunnel vision    Past Surgical History:  Procedure Laterality Date  . cervix dilated    . CHOLECYSTECTOMY N/A 09/13/2012   Procedure: LAPAROSCOPIC CHOLECYSTECTOMY WITH INTRAOPERATIVE CHOLANGIOGRAM;  Surgeon: Pedro Earls, MD;  Location: [facility];  Service: General;  Laterality: N/A;  . DILATION AND CURETTAGE OF UTERUS    . DILATION AND CURETTAGE, DIAGNOSTIC / THERAPEUTIC  1989   with Conization  . ESOPHAGOGASTRODUODENOSCOPY N/A 05/11/2012   Procedure: ESOPHAGOGASTRODUODENOSCOPY (EGD);  Surgeon: Rogene Houston, MD;  Location: [facility];  Service: Endoscopy;  Laterality: N/A;  125  . LEEP      Family History  Problem Relation Age of Onset  . Diabetes Mother   . Hypertension Father   . Healthy Sister   . Hypertension Sister   . Hypertension  Brother   . Gout Brother   . Heart attack Brother   . Breast cancer Sister 35       lumpectomy  . Sickle cell trait Brother   . Diabetes Maternal Grandmother        with 2 BKA  . Stroke Maternal Grandmother   . Breast cancer Maternal Aunt     Social History   Socioeconomic History  . Marital status: Single    Spouse name: Not on file  . Number of children: Not on file  . Years of education: Not on file  . Highest education level: Not on file  Occupational History  . Not on file  Tobacco Use  . Smoking status: Former Smoker    Packs/day: 0.25    Years: 10.00    Pack years: 2.50    Types: Cigarettes    Quit date: 06/20/1992    Years since quitting: 27.8  . Smokeless tobacco: Never Used  . Tobacco comment: Quit in 1995  Vaping Use  . Vaping Use: Never used  Substance and Sexual Activity  . Alcohol use: Not Currently    Alcohol/week: 1.0 standard drink    Types: 1 Glasses of wine per week    Comment: Patient may drink 2 glasses of wine a year  . Drug use: No  . Sexual activity: Never  Birth control/protection: Abstinence  Other Topics Concern  . Not on file  Social History Narrative  . Not on file   Social Determinants of Health   Financial Resource Strain: Not on file  Food Insecurity: Not on file  Transportation Needs: Not on file  Physical Activity: Not on file  Stress: Not on file  Social Connections: Not on file  Intimate Partner Violence: Not on file    Outpatient Medications Prior to Visit  Medication Sig Dispense Refill  . atorvastatin (LIPITOR) 20 MG tablet Take 1 tablet (20 mg total) by mouth daily. 90 tablet 3  . clobetasol ointment (TEMOVATE) 0.05 % Apply topically at affected area daily until clear 30 g 5  . dexamethasone 0.5 MG/5ML elixir Take 0.5 mg by mouth 4 (four) times daily.    Marland Kitchen docusate sodium (COLACE) 100 MG capsule Take 1 capsule (100 mg total) by mouth daily. 90 capsule 2  . fluocinonide gel (LIDEX) 0.05 % Apply topically 4 (four)  times daily.    . meclizine (ANTIVERT) 25 MG tablet Take 1 tablet (25 mg total) by mouth 2 (two) times daily as needed for dizziness. 90 tablet 4   No facility-administered medications prior to visit.    Allergies  Allergen Reactions  . Esomeprazole Magnesium     Patient states that the Nexium caused tremors    ROS Review of Systems As per HPI.    Objective:    Physical Exam Vitals and nursing note reviewed.  Constitutional:      General: She is not in acute distress.    Appearance: Normal appearance. She is not toxic-appearing or diaphoretic.  HENT:     Head: Normocephalic and atraumatic.     Right Ear: Ear canal and external ear normal. A middle ear effusion is present.     Left Ear: Ear canal and external ear normal. A middle ear effusion is present.     Nose: Congestion present.     Mouth/Throat:     Mouth: Mucous membranes are moist.     Pharynx: Oropharynx is clear. No oropharyngeal exudate or posterior oropharyngeal erythema.  Eyes:     Conjunctiva/sclera: Conjunctivae normal.     Pupils: Pupils are equal, round, and reactive to light.  Cardiovascular:     Rate and Rhythm: Normal rate and regular rhythm.     Heart sounds: Normal heart sounds. No murmur heard.   Pulmonary:     Effort: Pulmonary effort is normal. No respiratory distress.     Breath sounds: Normal breath sounds. No wheezing, rhonchi or rales.  Chest:     Chest wall: No tenderness.  Lymphadenopathy:     Cervical: No cervical adenopathy.  Skin:    General: Skin is warm and dry.  Neurological:     Mental Status: She is alert and oriented to person, place, and time.  Psychiatric:        Mood and Affect: Mood normal.        Behavior: Behavior normal.     BP 124/80   Pulse 80   Temp 97.9 F (36.6 C) (Temporal)   SpO2 96% Comment: room air Wt Readings from Last 3 Encounters:  03/05/20 287 lb (130.2 kg)  01/04/20 290 lb 6.4 oz (131.7 kg)  12/07/19 295 lb 3.2 oz (133.9 kg)     Health  Maintenance Due  Topic Date Due  . OPHTHALMOLOGY EXAM  03/05/2017  . COVID-19 Vaccine (3 - Booster for Pfizer series) 01/07/2020  . HEMOGLOBIN A1C  02/23/2020  There are no preventive care reminders to display for this patient.  Lab Results  Component Value Date   TSH 0.979 08/31/2016   Lab Results  Component Value Date   WBC 5.2 08/24/2019   HGB 12.6 08/24/2019   HCT 37.9 08/24/2019   MCV 80 08/24/2019   PLT 323 08/24/2019   Lab Results  Component Value Date   NA 141 08/24/2019   K 4.2 08/24/2019   CO2 22 08/24/2019   GLUCOSE 126 (H) 08/24/2019   BUN 7 (L) 08/24/2019   CREATININE 0.88 08/24/2019   BILITOT 0.3 08/24/2019   ALKPHOS 97 08/24/2019   AST 15 08/24/2019   ALT 27 08/24/2019   PROT 7.1 08/24/2019   ALBUMIN 3.8 08/24/2019   CALCIUM 8.6 (L) 08/24/2019   ANIONGAP 5 11/16/2017   Lab Results  Component Value Date   CHOL 164 08/24/2019   Lab Results  Component Value Date   HDL 58 08/24/2019   Lab Results  Component Value Date   LDLCALC 94 08/24/2019   Lab Results  Component Value Date   TRIG 61 08/24/2019   Lab Results  Component Value Date   CHOLHDL 2.8 08/24/2019   Lab Results  Component Value Date   HGBA1C 7.3 (H) 08/24/2019      Assessment & Plan:   Giorgina was seen today for cough.  Diagnoses and all orders for this visit:  Cough Tessalon perles ordered. Flu negative. Covid test pending, quarantine pending results.  -     Veritor Flu A/B Waived -     Novel Coronavirus, NAA ([facility]) -     benzonatate (TESSALON PERLES) 100 MG capsule; Take 1 capsule (100 mg total) by mouth 3 (three) times daily as needed for cough.  Chills Tylenol as needed for fever.   -     Veritor Flu A/B Waived -     Novel Coronavirus, NAA ([facility])  Head congestion Mucinex and nasal saline for congestion. Flonase for middle ear effusion. Tylenol for headache. Discussed outpatient treatment referral if Covid positive and within 5 days of symptoms start.   -     fluticasone (FLONASE) 50 MCG/ACT nasal spray; Place 2 sprays into both nostrils daily.   Follow-up: Return if symptoms worsen or fail to improve.   The patient indicates understanding of these issues and agrees with the plan.   Gwenlyn Perking, FNP

## 2020-04-08 ENCOUNTER — Telehealth: Payer: Self-pay | Admitting: *Deleted

## 2020-04-08 LAB — NOVEL CORONAVIRUS, NAA: SARS-CoV-2, NAA: NOT DETECTED

## 2020-04-08 LAB — SARS-COV-2, NAA 2 DAY TAT

## 2020-04-08 NOTE — Telephone Encounter (Signed)
I called and spoke with patient and told her okay to insert tampon. I informed her she was told not to insert tampon post endometrial biopsy. I also mentioned to her about the [facility] patient is aware and will wait to hear from [facility] to schedule.

## 2020-04-08 NOTE — Telephone Encounter (Signed)
Patient called c/o postmenopausal bleeding asked if okay to insert tampon. Had bx in Dec. 2021 and told not to use tampons. I left message for patient to call.

## 2020-04-09 ENCOUNTER — Telehealth: Payer: Self-pay | Admitting: Family

## 2020-04-09 NOTE — Telephone Encounter (Signed)
Okay for note

## 2020-04-09 NOTE — Telephone Encounter (Signed)
Pt needs a note for work - only needs it to say that she was seen in our office, tested for covid and that it was not detected. Would like it emailed to [email or web address]  Saw Tiffany but PCP is [employer]

## 2020-04-09 NOTE — Telephone Encounter (Signed)
Note completed and placed at front for pick up.  Left patient message informing her we cannot email and it was here for pick up.

## 2020-04-09 NOTE — Telephone Encounter (Signed)
Ok for note 

## 2020-04-11 ENCOUNTER — Ambulatory Visit: Payer: 59 | Admitting: Family

## 2020-05-12 ENCOUNTER — Telehealth: Payer: Self-pay

## 2020-05-12 NOTE — Telephone Encounter (Signed)
Call to patient. Per DPR, OK to leave message on voicemail.   Left voicemail requesting a return call to [facility] to review benefits for scheduled Sonohysterogram with Josefa Half, MD, Cherlynn June.

## 2020-05-14 NOTE — Telephone Encounter (Signed)
Spoke with patient regarding benefits for scheduled Sonohysterogram. Patient acknowledges understanding of information presented. Encounter closed.

## 2020-05-15 ENCOUNTER — Ambulatory Visit (INDEPENDENT_AMBULATORY_CARE_PROVIDER_SITE_OTHER): Payer: 59 | Admitting: Obstetrics and Gynecology

## 2020-05-15 ENCOUNTER — Encounter: Payer: Self-pay | Admitting: Obstetrics and Gynecology

## 2020-05-15 ENCOUNTER — Other Ambulatory Visit: Payer: Self-pay

## 2020-05-15 ENCOUNTER — Other Ambulatory Visit: Payer: Self-pay | Admitting: Obstetrics and Gynecology

## 2020-05-15 ENCOUNTER — Ambulatory Visit (INDEPENDENT_AMBULATORY_CARE_PROVIDER_SITE_OTHER): Payer: 59

## 2020-05-15 VITALS — BP 122/78 | HR 77 | Ht 65.5 in | Wt 280.0 lb

## 2020-05-15 DIAGNOSIS — N950 Postmenopausal bleeding: Secondary | ICD-10-CM

## 2020-05-15 NOTE — Progress Notes (Signed)
GYNECOLOGY  VISIT    HPI: 60 y.o.   Single  African American  female   G0P0000 with No LMP recorded. Patient is perimenopausal.   here for sonohysterogram for postmenopausal bleeding, thickened endometrium to 7 mm and negative endometrial biopsy.  She does have intramural and subserosal fibroids.   Uses tampons with postmenopausal bleeding.   No bleeding for 3 weeks per patient. Bled 1/16 - 20, 1/31, 2/1 - 2/4.  Periods stopped 15 - 16 years ago.    Not sexually active.   GYNECOLOGIC HISTORY: No LMP recorded. Patient is perimenopausal. Contraception:  PMP Menopausal hormone therapy:  none Last mammogram:  01/21/20 BIRADS 1 negative/density a Last pap smear:   12-07-19 Neg, 08-31-16 Neg  Hx of LEEP and ?conization to cervix in her 20's--paps normal since.        OB History    Gravida  0   Para  0   Term  0   Preterm  0   AB  0   Living  0     SAB  0   IAB  0   Ectopic  0   Multiple  0   Live Births  0              Patient Active Problem List   Diagnosis Date Noted  . Vertigo 07/23/2019  . Constipation 07/23/2019  . Anxiety state 03/18/2016  . Migraine equivalent 03/18/2016  . Morbid obesity ([place]) 06/06/2015  . Type 2 diabetes mellitus without complication, without long-term current use of insulin ([place]) 06/06/2015  . Elevated blood pressure 05/09/2015  . S/P lap cholecystectomy July 2014 09/14/2012  . Chest pain, unspecified 04/28/2012  . GERD (gastroesophageal reflux disease) 04/28/2012    Past Medical History:  Diagnosis Date  . Abnormal Pap smear of cervix    in her 20's-- hx of conization/LEEP procedure-normal paps since  . Abnormal uterine bleeding   . Anemia    ~2005  . Arthritis   . Chest pain   . Fibroid   . GERD (gastroesophageal reflux disease)   . Helicobacter pylori (H. pylori)   . Psoriasis   . Reflux   . STD (sexually transmitted disease) 1994   Tx'd for Chlamydia  . Vision abnormalities    tunnel vision    Past  Surgical History:  Procedure Laterality Date  . cervix dilated    . CHOLECYSTECTOMY N/A 09/13/2012   Procedure: LAPAROSCOPIC CHOLECYSTECTOMY WITH INTRAOPERATIVE CHOLANGIOGRAM;  Surgeon: Pedro Earls, MD;  Location: [facility];  Service: General;  Laterality: N/A;  . DILATION AND CURETTAGE OF UTERUS    . DILATION AND CURETTAGE, DIAGNOSTIC / THERAPEUTIC  1989   with Conization  . ESOPHAGOGASTRODUODENOSCOPY N/A 05/11/2012   Procedure: ESOPHAGOGASTRODUODENOSCOPY (EGD);  Surgeon: Rogene Houston, MD;  Location: [facility];  Service: Endoscopy;  Laterality: N/A;  125  . LEEP      Current Outpatient Medications  Medication Sig Dispense Refill  . atorvastatin (LIPITOR) 20 MG tablet Take 1 tablet (20 mg total) by mouth daily. 90 tablet 3  . clobetasol ointment (TEMOVATE) 0.05 % Apply topically at affected area daily until clear 30 g 5  . dexamethasone 0.5 MG/5ML elixir Take 0.5 mg by mouth 4 (four) times daily.    Marland Kitchen docusate sodium (COLACE) 100 MG capsule Take 1 capsule (100 mg total) by mouth daily. 90 capsule 2  . fluocinonide gel (LIDEX) 0.05 % Apply topically 4 (four) times daily.    . meclizine (ANTIVERT) 25  MG tablet Take 1 tablet (25 mg total) by mouth 2 (two) times daily as needed for dizziness. 90 tablet 4  . fluticasone (FLONASE) 50 MCG/ACT nasal spray Place 2 sprays into both nostrils daily. (Patient not taking: Reported on 05/15/2020) 16 g 6   No current facility-administered medications for this visit.     ALLERGIES: Esomeprazole magnesium  Family History  Problem Relation Age of Onset  . Diabetes Mother   . Hypertension Father   . Healthy Sister   . Hypertension Sister   . Hypertension Brother   . Gout Brother   . Heart attack Brother   . Breast cancer Sister 58       lumpectomy  . Sickle cell trait Brother   . Diabetes Maternal Grandmother        with 2 BKA  . Stroke Maternal Grandmother   . Breast cancer Maternal Aunt     Social History   Socioeconomic History  .  Marital status: Single    Spouse name: Not on file  . Number of children: Not on file  . Years of education: Not on file  . Highest education level: Not on file  Occupational History  . Not on file  Tobacco Use  . Smoking status: Former Smoker    Packs/day: 0.25    Years: 10.00    Pack years: 2.50    Types: Cigarettes    Quit date: 06/20/1992    Years since quitting: 27.9  . Smokeless tobacco: Never Used  . Tobacco comment: Quit in 1995  Vaping Use  . Vaping Use: Never used  Substance and Sexual Activity  . Alcohol use: Not Currently    Alcohol/week: 1.0 standard drink    Types: 1 Glasses of wine per week    Comment: Patient may drink 2 glasses of wine a year  . Drug use: No  . Sexual activity: Never    Birth control/protection: Abstinence  Other Topics Concern  . Not on file  Social History Narrative  . Not on file   Social Determinants of Health   Financial Resource Strain: Not on file  Food Insecurity: Not on file  Transportation Needs: Not on file  Physical Activity: Not on file  Stress: Not on file  Social Connections: Not on file  Intimate Partner Violence: Not on file    Review of Systems  Constitutional: Negative.   HENT: Negative.   Eyes: Negative.   Respiratory: Negative.   Cardiovascular: Negative.   Gastrointestinal: Negative.   Endocrine: Negative.   Genitourinary: Negative.   Musculoskeletal: Negative.   Skin: Negative.   Allergic/Immunologic: Negative.   Neurological: Negative.   Hematological: Negative.   Psychiatric/Behavioral: Negative.     PHYSICAL EXAMINATION:    BP 122/78   Pulse 77   Ht 5' 5.5" (1.664 m)   Wt 280 lb (127 kg)   SpO2 98%   BMI 45.89 kg/m     General appearance: alert, cooperative and appears stated age   Pelvic US/sononhysterogram. Consent for procedure. Pelvic US: Uterus with 8 fibroids, largest 2.25 cm, all intramural and subserosal. EMS 1.90 mm.  No masses noted.  Ovaries normal with 5 mm right ovarian  follicle.  No adnexal masses.  No free fluid.   Sonohysterogram. Sterilization of cervix with Hibiclens.  Tenaculum to anterior cervical lip.  Cannula introduced.  Normal saline injected and no filling defect noted.   No complications.  Minimal EBL.  Chaperone was present for exam.  ASSESSMENT  Postmenopausal bleeding.  Prior US showing thickened endometrium. Reassuring endometrial measurement today and no intracavitary masses noted.  Fibroids. Negative endometrial biopsy. Normal pap.   PLAN  Reassurance given regarding sonohysterogram findings today.  No further evaluation at this time.  I recommend against tampon use, which may be contributing to the bleeding if atrophy is present.  Call for any future vaginal bleeding after bleeding from the procedure today has ended.  If bleeding recurs, may need hysteroscopy with dilation and curettage.   23 min  total time was spent for this patient encounter, including preparation, face-to-face counseling with the patient, coordination of care, and documentation of the encounter.

## 2020-05-23 ENCOUNTER — Ambulatory Visit: Payer: 59 | Admitting: Family

## 2020-05-27 ENCOUNTER — Encounter: Payer: Self-pay | Admitting: Family

## 2020-07-02 ENCOUNTER — Encounter (HOSPITAL_COMMUNITY): Payer: Self-pay | Admitting: Emergency Medicine

## 2020-07-02 ENCOUNTER — Other Ambulatory Visit: Payer: Self-pay

## 2020-07-02 ENCOUNTER — Emergency Department (HOSPITAL_COMMUNITY): Payer: 59

## 2020-07-02 ENCOUNTER — Emergency Department (HOSPITAL_COMMUNITY)
Admission: EM | Admit: 2020-07-02 | Discharge: 2020-07-02 | Disposition: A | Discharge status: Home / Self Care | Payer: 59 | Attending: Emergency Medicine | Admitting: Emergency Medicine

## 2020-07-02 DIAGNOSIS — Z87891 Personal history of nicotine dependence: Secondary | ICD-10-CM | POA: Diagnosis not present

## 2020-07-02 DIAGNOSIS — I480 Paroxysmal atrial fibrillation: Secondary | ICD-10-CM

## 2020-07-02 DIAGNOSIS — R0602 Shortness of breath: Secondary | ICD-10-CM | POA: Diagnosis present

## 2020-07-02 DIAGNOSIS — E119 Type 2 diabetes mellitus without complications: Secondary | ICD-10-CM | POA: Diagnosis not present

## 2020-07-02 LAB — BASIC METABOLIC PANEL
Anion gap: 8 (ref 5–15)
BUN: 15 mg/dL (ref 8–23)
CO2: 26 mmol/L (ref 22–32)
Calcium: 8.7 mg/dL — ABNORMAL LOW (ref 8.9–10.3)
Chloride: 104 mmol/L (ref 98–111)
Creatinine, Ser: 0.76 mg/dL (ref 0.44–1.00)
GFR, Estimated: >60 mL/min (ref >60)
Glucose, Bld: 101 mg/dL — ABNORMAL HIGH (ref 70–99)
Potassium: 3.7 mmol/L (ref 3.5–5.1)
Sodium: 138 mmol/L (ref 135–145)

## 2020-07-02 LAB — CBC
HCT: 38.9 % (ref 36.0–46.0)
Hemoglobin: 12 g/dL (ref 12.0–15.0)
MCH: 26.7 pg (ref 26.0–34.0)
MCHC: 30.8 g/dL (ref 30.0–36.0)
MCV: 86.4 fL (ref 80.0–100.0)
Platelets: 332 10*3/uL (ref 150–400)
RBC: 4.5 MIL/uL (ref 3.87–5.11)
RDW: 13.8 % (ref 11.5–15.5)
WBC: 5.8 10*3/uL (ref 4.0–10.5)
nRBC: 0 % (ref 0.0–0.2)

## 2020-07-02 LAB — TROPONIN I (HIGH SENSITIVITY): Troponin I (High Sensitivity): 5 ng/L (ref <18)

## 2020-07-02 LAB — MAGNESIUM: Magnesium: 1.9 mg/dL (ref 1.7–2.4)

## 2020-07-02 LAB — TSH: TSH: 0.711 u[IU]/mL (ref 0.350–4.500)

## 2020-07-02 MED ORDER — METOPROLOL SUCCINATE ER 25 MG PO TB24: 25.0000 mg | ORAL_TABLET | Freq: Every day | ORAL | 0 refills | Status: DC

## 2020-07-02 NOTE — Discharge Instructions (Addendum)
Make sure you are getting plenty of rest and drinking a lot of fluids.  Try to eat a heart healthy diet, low fat and low salt.  Return here, if needed, for problems.

## 2020-07-02 NOTE — ED Provider Notes (Signed)
[facility] Provider Note   CSN: [id number] Arrival date & time: 07/02/20  1337     History Chief Complaint  Patient presents with  . Chest Pain    Maria Winters is a 60 y.o. female.  HPI Patient presents with fluttering and mild shortness of breath.  Has had more constantly since around 8:00 this morning but more at night over the last 3 to 4 days.  States she will feel her heart beating quickly.  Then she will cough and it may go away.  May feel little lightheaded with it.  No real chest pain.  No nausea or vomiting.  No fevers or chills.  Rare cough.  No swelling her legs.  Has lost 17 pounds over the last month but states she has been trying to.  Not on any stimulants.  No fevers or chills.  No nausea vomiting or diarrhea.    Past Medical History:  Diagnosis Date  . Abnormal Pap smear of cervix    in her 20's-- hx of conization/LEEP procedure-normal paps since  . Abnormal uterine bleeding   . Anemia    ~2005  . Arthritis   . Chest pain   . Fibroid   . GERD (gastroesophageal reflux disease)   . Helicobacter pylori (H. pylori)   . Psoriasis   . Reflux   . STD (sexually transmitted disease) 1994   Tx'd for Chlamydia  . Vision abnormalities    tunnel vision    Patient Active Problem List   Diagnosis Date Noted  . Vertigo 07/23/2019  . Constipation 07/23/2019  . Anxiety state 03/18/2016  . Migraine equivalent 03/18/2016  . Morbid obesity ([place]) 06/06/2015  . Type 2 diabetes mellitus without complication, without long-term current use of insulin ([place]) 06/06/2015  . Elevated blood pressure 05/09/2015  . S/P lap cholecystectomy July 2014 09/14/2012  . Chest pain, unspecified 04/28/2012  . GERD (gastroesophageal reflux disease) 04/28/2012    Past Surgical History:  Procedure Laterality Date  . cervix dilated    . CHOLECYSTECTOMY N/A 09/13/2012   Procedure: LAPAROSCOPIC CHOLECYSTECTOMY WITH INTRAOPERATIVE CHOLANGIOGRAM;  Surgeon: Pedro Earls, MD;  Location: [facility];  Service: General;  Laterality: N/A;  . DILATION AND CURETTAGE OF UTERUS    . DILATION AND CURETTAGE, DIAGNOSTIC / THERAPEUTIC  1989   with Conization  . ESOPHAGOGASTRODUODENOSCOPY N/A 05/11/2012   Procedure: ESOPHAGOGASTRODUODENOSCOPY (EGD);  Surgeon: Rogene Houston, MD;  [facility];  Service: Endoscopy;  Laterality: N/A;  125  . LEEP       OB History    Gravida  0   Para  0   Term  0   Preterm  0   AB  0   Living  0     SAB  0   IAB  0   Ectopic  0   Multiple  0   Live Births  0           Family History  Problem Relation Age of Onset  . Diabetes Mother   . Hypertension Father   . Healthy Sister   . Hypertension Sister   . Hypertension Brother   . Gout Brother   . Heart attack Brother   . Breast cancer Sister 41       lumpectomy  . Sickle cell trait Brother   . Diabetes Maternal Grandmother        with 2 BKA  . Stroke Maternal Grandmother   . Breast cancer Maternal  Aunt     Social History   Tobacco Use  . Smoking status: Former Smoker    Packs/day: 0.25    Years: 10.00    Pack years: 2.50    Types: Cigarettes    Quit date: 06/20/1992    Years since quitting: 28.0  . Smokeless tobacco: Never Used  . Tobacco comment: Quit in 1995  Vaping Use  . Vaping Use: Never used  Substance Use Topics  . Alcohol use: Not Currently    Alcohol/week: 1.0 standard drink    Types: 1 Glasses of wine per week    Comment: Patient may drink 2 glasses of wine a year  . Drug use: No    Home Medications Prior to Admission medications   Medication Sig Start Date End Date Taking? Authorizing Provider  metoprolol succinate (TOPROL-XL) 25 MG 24 hr tablet Take 1 tablet (25 mg total) by mouth daily. 07/02/20  Yes Davonna Belling, MD  atorvastatin (LIPITOR) 20 MG tablet Take 1 tablet (20 mg total) by mouth daily. 08/27/19   Sharion Balloon, FNP  clobetasol ointment (TEMOVATE) 0.05 % Apply topically at affected area daily  until clear 12/04/19   Lavonna Monarch, MD  dexamethasone 0.5 MG/5ML elixir Take 0.5 mg by mouth 4 (four) times daily. 11/20/19   [provider]  docusate sodium (COLACE) 100 MG capsule Take 1 capsule (100 mg total) by mouth daily. 07/23/19   Evelina Dun A, FNP  fluocinonide gel (LIDEX) 0.05 % Apply topically 4 (four) times daily. 11/15/19   [provider]  fluticasone (FLONASE) 50 MCG/ACT nasal spray Place 2 sprays into both nostrils daily. Patient not taking: Reported on 05/15/2020 04/07/20   Gwenlyn Perking, FNP  meclizine (ANTIVERT) 25 MG tablet Take 1 tablet (25 mg total) by mouth 2 (two) times daily as needed for dizziness. 07/23/19   Sharion Balloon, FNP    Allergies    Esomeprazole magnesium  Review of Systems   Review of Systems  Constitutional: Negative for appetite change and fever.  HENT: Negative for congestion.   Respiratory: Positive for shortness of breath.   Cardiovascular: Positive for palpitations.  Genitourinary: Negative for dysuria.  Musculoskeletal: Negative for back pain.  Skin: Negative for rash.  Neurological: Negative for weakness.  Psychiatric/Behavioral: Negative for confusion.    Physical Exam Updated Vital Signs BP 125/73   Pulse 75   Temp 98.4 F (36.9 C) (Oral)   Resp 18   Ht 5\' 5"  (1.651 m)   Wt 127 kg   SpO2 96%   BMI 46.59 kg/m   Physical Exam Vitals and nursing note reviewed.  HENT:     Head: Normocephalic.  Cardiovascular:     Rate and Rhythm: Normal rate. Rhythm irregular.  Pulmonary:     Breath sounds: No wheezing, rhonchi or rales.  Chest:     Chest wall: No tenderness.  Abdominal:     Tenderness: There is no abdominal tenderness.  Musculoskeletal:     Cervical back: Neck supple.     Right lower leg: No edema.     Left lower leg: No edema.  Skin:    General: Skin is warm.     Capillary Refill: Capillary refill takes less than 2 seconds.  Neurological:     Mental Status: She is alert and oriented to  person, place, and time.     ED Results / Procedures / Treatments   Labs (all labs ordered are listed, but only abnormal results are displayed) Labs Reviewed  BASIC METABOLIC PANEL - Abnormal; Notable for the following components:      Result Value   Glucose, Bld 101 (*)    Calcium 8.7 (*)    All other components within normal limits  CBC  MAGNESIUM  TSH  TROPONIN I (HIGH SENSITIVITY)    EKG EKG Interpretation  Date/Time:  Wednesday July 02 2020 13:44:37 EDT Ventricular Rate:  101 PR Interval:    QRS Duration: 141 QT Interval:  374 QTC Calculation: 485 R Axis:   241 Text Interpretation: sinus beats with afib with abarency RBBB and LAFB also sinus beats Reconfirmed by Davonna Belling [phone]) on 07/02/2020 3:22:50 PM   Radiology DG Chest Portable 1 View  Result Date: 07/02/2020 CLINICAL DATA:  Shortness of breath, chest fluttering and shortness of breath for 3 days in this 60 year old female. EXAM: PORTABLE CHEST 1 VIEW COMPARISON:  September 07, 2012 FINDINGS: EKG leads project over the chest. Trachea midline. Cardiomediastinal contours are top normal to mildly enlarged, accentuated by portable technique. Mild central pulmonary vascular engorgement may be present. No consolidation. No sign of pleural effusion. Subtle opacities are seen in the RIGHT and LEFT mid chest. Also at the LEFT lung base along the LEFT heart border. On limited assessment no acute skeletal process. IMPRESSION: Mild cardiac enlargement with signs of central pulmonary vascular engorgement. Subtle increased interstitial markings and vague opacities could also reflect asymmetric edema or viral infection. Consider PA and lateral chest radiograph to ensure resolution on follow-up. Electronically Signed   By: Zetta Bills M.D.   On: 07/02/2020 14:45    Procedures Procedures   Medications Ordered in ED Medications - No data to display  ED Course  I have reviewed the triage vital signs and the nursing  notes.  Pertinent labs & imaging results that were available during my care of the patient were reviewed by me and considered in my medical decision making (see chart for details).    MDM Rules/Calculators/A&P                          Patient presents with palpitations.  Has been feeling for the last few days.  EKG shows what appears to be sinus rhythm overall with episodes of A. fib with likely aberrancy.  Have been reviewed with cardiology.  We will check lab work to look for electrolyte abnormalities. CHA2DS2-VASc score of 2 for female and diabetes.  Likely will be able to discharge home.  We will start low-dose metoprolol with A. fib clinic follow-up.  Will leave anticoagulation to the clinic since overall there is low risk for the the next few days. Final Clinical Impression(s) / ED Diagnoses Final diagnoses:  Paroxysmal atrial fibrillation [facility])    Rx / DC Orders ED Discharge Orders         Ordered    Amb Referral to AFIB Clinic        07/02/20 1545    metoprolol succinate (TOPROL-XL) 25 MG 24 hr tablet  Daily        07/02/20 1549           Davonna Belling, MD 07/02/20 1552

## 2020-07-02 NOTE — ED Provider Notes (Signed)
3:55 PM-checkout from Dr. Alvino Chapel to evaluate patient following return of labs for consideration of disposition.  Patient presenting with fluttering heartbeats, found to have intermittent atrial fibrillation in the ED.  Symptoms ongoing for several days.  Plan is to follow-up with cardiology services as outpatient.  4:10 PM-labs returned.  Troponin normal, metabolic panel normal, CBC normal.  Magnesium and TSH normal.  Chest x-ray cardiomegaly and central pulmonary vascular engorgement.  No respiratory distress.  Vital signs at 1430, normal.  No oxygen requirement.   EKG Interpretation  Date/Time:  Wednesday July 02 2020 13:44:37 EDT Ventricular Rate:  101 PR Interval:    QRS Duration: 141 QT Interval:  374 QTC Calculation: 485 R Axis:   241 Text Interpretation: sinus beats with afib with abarency RBBB and LAFB also sinus beats Reconfirmed by Davonna Belling [phone]) on 07/02/2020 3:22:50 PM        At this time the patient is alert comfortable.  Cardiac monitor, sinus rhythm at this time.  Patient is comfortable and has no further complaints.  Findings discussed and questions answered.  She request some time off work and I will release her until 07/07/2020.   Daleen Bo, MD 07/02/20 1620

## 2020-07-02 NOTE — ED Triage Notes (Signed)
Pt c/o of chest "flutters" and sob x 3 days.

## 2020-07-02 NOTE — ED Notes (Signed)
ED Provider at bedside. 

## 2020-07-03 NOTE — Progress Notes (Signed)
Primary Physician/Referring:  Sharion Balloon, FNP  Patient ID: Maria Winters, female    DOB: 12-27-1960, 59 y.o.   MRN: [id number]  Chief Complaint  Patient presents with  . New Patient (Initial Visit)    Hospital follow up  . heart flutters  . Atrial Fibrillation   HPI:    Maria Winters  is a 60 y.o. African-American obese female with history of diabetes, hypertension, hyperlipidemia.  Although patient has lost weight over the last several months and A1c as well as lipid and blood pressure control have improved. She quit smoking in 1994.   She was seen in [facility] emergency department on 07/02/2020 with complaints of palpitations and shortness of breath.  Cardiac monitoring while in the emergency department revealed episodes of atrial fibrillation.  Patient was therefore started on low-dose metoprolol for rate control, however anticoagulation was held due to CHA2DS2-VASc score of 2.  Patient now presents for follow-up and to establish care in our office.  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  Regularly walks 30 minutes/day approximately 6 days/week without issue.  She does note that over the last several weeks she has occasionally been awakened with palpitations and feelings of shortness of breath.  Typically these palpitation episodes are brief lasting less than 1 minute, however they have lasted as long as 3 hours with associated shortness of breath and fatigue.  Since initiation of metoprolol patient has only had a single episode of palpitations over the last 2 days.  She denies chest pain, syncope, near syncope, dizziness, lightheadedness.  Denies orthopnea, PND, leg swelling.  Past Medical History:  Diagnosis Date  . Abnormal Pap smear of cervix    in her 20's-- hx of conization/LEEP procedure-normal paps since  . Abnormal uterine bleeding   . Anemia    ~2005  . Arthritis   . Chest pain   . Fibroid   . GERD (gastroesophageal reflux  disease)   . Helicobacter pylori (H. pylori)   . Psoriasis   . Reflux   . STD (sexually transmitted disease) 1994   Tx'd for Chlamydia  . Vision abnormalities    tunnel vision   Past Surgical History:  Procedure Laterality Date  . cervix dilated    . CHOLECYSTECTOMY N/A 09/13/2012   Procedure: LAPAROSCOPIC CHOLECYSTECTOMY WITH INTRAOPERATIVE CHOLANGIOGRAM;  Surgeon: Pedro Earls, MD;  Location: [facility] ORS;  Service: General;  Laterality: N/A;  . DILATION AND CURETTAGE OF UTERUS    . DILATION AND CURETTAGE, DIAGNOSTIC / THERAPEUTIC  1989   with Conization  . ESOPHAGOGASTRODUODENOSCOPY N/A 05/11/2012   Procedure: ESOPHAGOGASTRODUODENOSCOPY (EGD);  Surgeon: Rogene Houston, MD;  Location: [facility] SU[facility];  Service: Endoscopy;  Laterality: N/A;  125  . LEEP     Family History  Problem Relation Age of Onset  . Diabetes Mother   . Hypertension Father   . Healthy Sister   . Hypertension Sister   . Hypertension Brother   . Gout Brother   . Heart attack Brother   . Breast cancer Sister 69       lumpectomy  . Sickle cell trait Brother   . Diabetes Maternal Grandmother        with 2 BKA  . Stroke Maternal Grandmother   . Breast cancer Maternal Aunt     Social History   Tobacco Use  . Smoking status: Former Smoker    Packs/day: 0.25    Years: 10.00    Pack  years: 2.50    Types: Cigarettes    Quit date: 06/20/1992    Years since quitting: 28.0  . Smokeless tobacco: Never Used  . Tobacco comment: Quit in 1995  Substance Use Topics  . Alcohol use: Not Currently    Alcohol/week: 1.0 standard drink    Types: 1 Glasses of wine per week    Comment: Patient may drink 2 glasses of wine a year   Marital Status: Single   ROS  Review of Systems  Constitutional: Positive for malaise/fatigue. Negative for weight gain.  Cardiovascular: Positive for palpitations. Negative for chest pain, claudication, leg swelling, near-syncope, orthopnea, paroxysmal nocturnal dyspnea and syncope.   Respiratory: Positive for shortness of breath.   Hematologic/Lymphatic: Does not bruise/bleed easily.  Gastrointestinal: Negative for melena.  Neurological: Negative for dizziness and weakness.    Objective  Blood pressure 125/73, pulse 91, temperature 98 F (36.7 C), temperature source Temporal, resp. rate 17, height 5\' 5"  (1.651 m), weight 274 lb 12.8 oz (124.6 kg), SpO2 96 %.  Vitals with BMI 07/04/2020 07/02/2020 07/02/2020  Height 5\' 5"  - -  Weight 274 lbs 13 oz - -  BMI 50.09 - -  Systolic 381 829 -  Diastolic 73 69 -  Pulse 91 75 78      Physical Exam Vitals reviewed.  HENT:     Head: Normocephalic and atraumatic.  Cardiovascular:     Rate and Rhythm: Normal rate and regular rhythm.     Pulses: Intact distal pulses.     Heart sounds: S1 normal and S2 normal. No murmur heard. No gallop.   Pulmonary:     Effort: Pulmonary effort is normal. No respiratory distress.     Breath sounds: No wheezing, rhonchi or rales.  Abdominal:     General: Bowel sounds are normal.     Palpations: Abdomen is soft.  Musculoskeletal:     Right lower leg: No edema.     Left lower leg: No edema.  Skin:    General: Skin is warm and dry.  Neurological:     Mental Status: She is alert.     Laboratory examination:   Recent Labs    08/24/19 0806 07/02/20 1442  NA 141 138  K 4.2 3.7  CL 105 104  CO2 22 26  GLUCOSE 126* 101*  BUN 7* 15  CREATININE 0.88 0.76  CALCIUM 8.6* 8.7*  GFRNONAA 72 >60  GFRAA 83  --    estimated creatinine clearance is 97.9 mL/min (by C-G formula based on SCr of 0.76 mg/dL).  CMP Latest Ref Rng & Units 07/02/2020 08/24/2019 11/16/2017  Glucose 70 - 99 mg/dL 101(H) 126(H) 114(H)  BUN 8 - 23 mg/dL 15 7(L) 7  Creatinine 0.44 - 1.00 mg/dL 0.76 0.88 0.69  Sodium 135 - 145 mmol/L 138 141 140  Potassium 3.5 - 5.1 mmol/L 3.7 4.2 3.8  Chloride 98 - 111 mmol/L 104 105 106  CO2 22 - 32 mmol/L 26 22 29   Calcium 8.9 - 10.3 mg/dL 8.7(L) 8.6(L) 8.7(L)  Total Protein  6.0 - 8.5 g/dL - 7.1 -  Total Bilirubin 0.0 - 1.2 mg/dL - 0.3 -  Alkaline Phos 48 - 121 IU/L - 97 -  AST 0 - 40 IU/L - 15 -  ALT 0 - 32 IU/L - 27 -   CBC Latest Ref Rng & Units 07/02/2020 08/24/2019 11/16/2017  WBC 4.0 - 10.5 K/uL 5.8 5.2 7.4  Hemoglobin 12.0 - 15.0 g/dL 12.0 12.6 12.4  Hematocrit 36.0 -  46.0 % 38.9 37.9 38.8  Platelets 150 - 400 K/uL 332 323 367    Lipid Panel Recent Labs    08/24/19 0806  CHOL 164  TRIG 61  LDLCALC 94  HDL 58  CHOLHDL 2.8    HEMOGLOBIN A1C Lab Results  Component Value Date   HGBA1C 7.3 (H) 08/24/2019   TSH Recent Labs    07/02/20 1442  TSH 0.711    External labs:  None   Medications and allergies   Allergies  Allergen Reactions  . Esomeprazole Magnesium     Patient states that the Nexium caused tremors     Outpatient Medications Prior to Visit  Medication Sig Dispense Refill  . atorvastatin (LIPITOR) 20 MG tablet Take 1 tablet (20 mg total) by mouth daily. 90 tablet 3  . clobetasol ointment (TEMOVATE) 0.05 % Apply topically at affected area daily until clear 30 g 5  . dexamethasone 0.5 MG/5ML elixir Take 0.5 mg by mouth 4 (four) times daily.    Marland Kitchen docusate sodium (COLACE) 100 MG capsule Take 1 capsule (100 mg total) by mouth daily. 90 capsule 2  . fluocinonide gel (LIDEX) 0.05 % Apply topically 4 (four) times daily.    . fluticasone (FLONASE) 50 MCG/ACT nasal spray Place 2 sprays into both nostrils daily. 16 g 6  . meclizine (ANTIVERT) 25 MG tablet Take 1 tablet (25 mg total) by mouth 2 (two) times daily as needed for dizziness. 90 tablet 4  . naproxen sodium (ALEVE) 220 MG tablet Take 220 mg by mouth.    . metoprolol succinate (TOPROL-XL) 25 MG 24 hr tablet Take 1 tablet (25 mg total) by mouth daily. 30 tablet 0   No facility-administered medications prior to visit.     Radiology:   [facility] Chest Portable 1 View 07/02/2020 EKG leads project over the chest. Trachea midline. Cardiomediastinal contours are top normal to mildly  enlarged, accentuated by portable technique. Mild central pulmonary vascular engorgement may be present. No consolidation. No sign of pleural effusion. Subtle opacities are seen in the RIGHT and LEFT mid chest. Also at the LEFT lung base along the LEFT heart border. On limited assessment no acute skeletal process.   IMPRESSION: Mild cardiac enlargement with signs of central pulmonary vascular engorgement. Subtle increased interstitial markings and vague opacities could also reflect asymmetric edema or viral infection. Consider PA and lateral chest radiograph to ensure resolution on follow-up.   Cardiac Studies:   None   EKG:   EKG 07/04/2020: Sinus rhythm at a rate of 64 bpm.  Left atrial enlargement.  Normal axis.  No evidence of ischemia or underlying injury pattern.  Assessment     ICD-10-CM   1. Paroxysmal atrial fibrillation (HCC)  I48.0 EKG 12-Lead    PCV ECHOCARDIOGRAM COMPLETE    PCV MYOCARDIAL PERFUSION WO LEXISCAN    LONG TERM MONITOR (3-14 DAYS)  2. Palpitations  R00.2 LONG TERM MONITOR (3-14 DAYS)  3. Shortness of breath  R06.02 PCV ECHOCARDIOGRAM COMPLETE    PCV MYOCARDIAL PERFUSION WO LEXISCAN  4. Mixed hyperlipidemia  E78.2 Lipid Panel With LDL/HDL Ratio     Medications Discontinued During This Encounter  Medication Reason  . metoprolol succinate (TOPROL-XL) 25 MG 24 hr tablet Reorder    Meds ordered this encounter  Medications  . apixaban (ELIQUIS) 5 MG TABS tablet    Sig: Take 1 tablet (5 mg total) by mouth 2 (two) times daily.    Dispense:  60 tablet    Refill:  3  .  metoprolol succinate (TOPROL-XL) 25 MG 24 hr tablet    Sig: Take 1 tablet (25 mg total) by mouth daily.    Dispense:  30 tablet    Refill:  0   This patients CHA2DS2-VASc Score 2 (F, DM) and yearly risk of stroke 2.2%.   Recommendations:   Maria Winters is a 60 y.o.  African-American obese female with history of diabetes, hypertension, hyperlipidemia.  Although patient has lost weight over  the last several months and A1c as well as lipid and blood pressure control have improved. She quit smoking in 1994.  She presents to our office for further evaluation of atrial fibrillation and to establish care.  Patient presents newly diagnosed with paroxysmal atrial fibrillation in 06/2020.  EKG today reveals sinus rhythm at a rate of 64 bpm.  Patient is tolerating metoprolol without issue, will continue this.  Discussed at length with patient regarding indications, risks, benefits of anticoagulation given paroxysmal atrial fibrillation.  Patient verbalized understanding of risks, benefits, and indications.  Shared decision was to proceed with initiation of anticoagulation.  We will start Eliquis 5 mg twice daily.  Patient will notify our office immediately of any bleeding diathesis.  Also recommend further evaluation of underlying CAD as etiology of atrial fibrillation given patient's cardiovascular risk factors.  We will obtain nuclear stress test.  We will also obtain echocardiogram to evaluate left ventricular function.  We will also obtain 2-week cardiac monitor to assess patient's underlying atrial fibrillation burden as well as for any other underlying cardiac arrhythmias.  Patient's 10-year ASCVD risk is 5.5%, recommend continuation of atorvastatin.  We will plan to repeat lipid profile testing.  Follow-up in 4 weeks, sooner if needed, for atrial fibrillation, dyspnea on exertion, results of cardiac testing.   Alethia Berthold, PA-C 07/07/2020, 5:29 PM Office: [phone]

## 2020-07-04 ENCOUNTER — Inpatient Hospital Stay: Payer: 59

## 2020-07-04 ENCOUNTER — Encounter: Payer: Self-pay | Admitting: Student

## 2020-07-04 ENCOUNTER — Ambulatory Visit: Payer: 59 | Admitting: Student

## 2020-07-04 ENCOUNTER — Other Ambulatory Visit: Payer: Self-pay

## 2020-07-04 VITALS — BP 125/73 | HR 91 | Temp 98.0°F | Resp 17 | Ht 65.0 in | Wt 274.8 lb

## 2020-07-04 DIAGNOSIS — E782 Mixed hyperlipidemia: Secondary | ICD-10-CM

## 2020-07-04 DIAGNOSIS — R0602 Shortness of breath: Secondary | ICD-10-CM

## 2020-07-04 DIAGNOSIS — I480 Paroxysmal atrial fibrillation: Secondary | ICD-10-CM

## 2020-07-04 DIAGNOSIS — R002 Palpitations: Secondary | ICD-10-CM

## 2020-07-04 MED ORDER — APIXABAN 5 MG PO TABS: 5.0000 mg | ORAL_TABLET | Freq: Two times a day (BID) | ORAL | 3 refills | Status: DC

## 2020-07-04 MED ORDER — METOPROLOL SUCCINATE ER 25 MG PO TB24
25.0000 mg | ORAL_TABLET | Freq: Every day | ORAL | 0 refills | Status: DC
Start: 2020-07-04 — End: 2020-08-15

## 2020-07-11 ENCOUNTER — Encounter: Payer: Self-pay | Admitting: Family Medicine

## 2020-07-16 ENCOUNTER — Encounter: Payer: Self-pay | Admitting: Family Medicine

## 2020-07-17 ENCOUNTER — Ambulatory Visit: Payer: 59 | Admitting: Family

## 2020-07-17 ENCOUNTER — Encounter: Payer: Self-pay | Admitting: Family

## 2020-07-17 ENCOUNTER — Other Ambulatory Visit: Payer: Self-pay

## 2020-07-17 VITALS — BP 125/78 | HR 92 | Temp 96.7°F | Ht 65.0 in | Wt 274.6 lb

## 2020-07-17 DIAGNOSIS — K219 Gastro-esophageal reflux disease without esophagitis: Secondary | ICD-10-CM | POA: Diagnosis not present

## 2020-07-17 DIAGNOSIS — E119 Type 2 diabetes mellitus without complications: Secondary | ICD-10-CM | POA: Diagnosis not present

## 2020-07-17 DIAGNOSIS — E1169 Type 2 diabetes mellitus with other specified complication: Secondary | ICD-10-CM

## 2020-07-17 DIAGNOSIS — E785 Hyperlipidemia, unspecified: Secondary | ICD-10-CM

## 2020-07-17 DIAGNOSIS — K5900 Constipation, unspecified: Secondary | ICD-10-CM

## 2020-07-17 LAB — BAYER DCA HB A1C WAIVED: HB A1C (BAYER DCA - WAIVED): 6.9 % (ref <7.0)

## 2020-07-17 MED ORDER — ATORVASTATIN CALCIUM 20 MG PO TABS: 20.0000 mg | ORAL_TABLET | Freq: Every day | ORAL | 3 refills | Status: DC

## 2020-07-17 NOTE — Patient Instructions (Signed)
Health Maintenance, Female Adopting a healthy lifestyle and getting preventive care are important in promoting health and wellness. Ask your health care provider about:  The right schedule for you to have regular tests and exams.  Things you can do on your own to prevent diseases and keep yourself healthy. What should I know about diet, weight, and exercise? Eat a healthy diet  Eat a diet that includes plenty of vegetables, fruits, low-fat dairy products, and lean protein.  Do not eat a lot of foods that are high in solid fats, added sugars, or sodium.   Maintain a healthy weight Body mass index (BMI) is used to identify weight problems. It estimates body fat based on height and weight. Your health care provider can help determine your BMI and help you achieve or maintain a healthy weight. Get regular exercise Get regular exercise. This is one of the most important things you can do for your health. Most adults should:  Exercise for at least 150 minutes each week. The exercise should increase your heart rate and make you sweat (moderate-intensity exercise).  Do strengthening exercises at least twice a week. This is in addition to the moderate-intensity exercise.  Spend less time sitting. Even light physical activity can be beneficial. Watch cholesterol and blood lipids Have your blood tested for lipids and cholesterol at 62 years of age, then have this test every 5 years. Have your cholesterol levels checked more often if:  Your lipid or cholesterol levels are high.  You are older than 62 years of age.  You are at high risk for heart disease. What should I know about cancer screening? Depending on your health history and family history, you may need to have cancer screening at various ages. This may include screening for:  Breast cancer.  Cervical cancer.  Colorectal cancer.  Skin cancer.  Lung cancer. What should I know about heart disease, diabetes, and high blood  pressure? Blood pressure and heart disease  High blood pressure causes heart disease and increases the risk of stroke. This is more likely to develop in people who have high blood pressure readings, are of African descent, or are overweight.  Have your blood pressure checked: ? Every 3-5 years if you are 18-39 years of age. ? Every year if you are 40 years old or older. Diabetes Have regular diabetes screenings. This checks your fasting blood sugar level. Have the screening done:  Once every three years after age 40 if you are at a normal weight and have a low risk for diabetes.  More often and at a younger age if you are overweight or have a high risk for diabetes. What should I know about preventing infection? Hepatitis B If you have a higher risk for hepatitis B, you should be screened for this virus. Talk with your health care provider to find out if you are at risk for hepatitis B infection. Hepatitis C Testing is recommended for:  Everyone born from 1945 through 1965.  Anyone with known risk factors for hepatitis C. Sexually transmitted infections (STIs)  Get screened for STIs, including gonorrhea and chlamydia, if: ? You are sexually active and are younger than 62 years of age. ? You are older than 62 years of age and your health care provider tells you that you are at risk for this type of infection. ? Your sexual activity has changed since you were last screened, and you are at increased risk for chlamydia or gonorrhea. Ask your health care provider   if you are at risk.  Ask your health care provider about whether you are at high risk for HIV. Your health care provider may recommend a prescription medicine to help prevent HIV infection. If you choose to take medicine to prevent HIV, you should first get tested for HIV. You should then be tested every 3 months for as long as you are taking the medicine. Pregnancy  If you are about to stop having your period (premenopausal) and  you may become pregnant, seek counseling before you get pregnant.  Take 400 to 800 micrograms (mcg) of folic acid every day if you become pregnant.  Ask for birth control (contraception) if you want to prevent pregnancy. Osteoporosis and menopause Osteoporosis is a disease in which the bones lose minerals and strength with aging. This can result in bone fractures. If you are 65 years old or older, or if you are at risk for osteoporosis and fractures, ask your health care provider if you should:  Be screened for bone loss.  Take a calcium or vitamin D supplement to lower your risk of fractures.  Be given hormone replacement therapy (HRT) to treat symptoms of menopause. Follow these instructions at home: Lifestyle  Do not use any products that contain nicotine or tobacco, such as cigarettes, e-cigarettes, and chewing tobacco. If you need help quitting, ask your health care provider.  Do not use street drugs.  Do not share needles.  Ask your health care provider for help if you need support or information about quitting drugs. Alcohol use  Do not drink alcohol if: ? Your health care provider tells you not to drink. ? You are pregnant, may be pregnant, or are planning to become pregnant.  If you drink alcohol: ? Limit how much you use to 0-1 drink a day. ? Limit intake if you are breastfeeding.  Be aware of how much alcohol is in your drink. In the [place] one drink equals one 12 oz bottle of beer (355 mL), one 5 oz glass of wine (148 mL), or one 1 oz glass of hard liquor (44 mL). General instructions  Schedule regular health, dental, and eye exams.  Stay current with your vaccines.  Tell your health care provider if: ? You often feel depressed. ? You have ever been abused or do not feel safe at home. Summary  Adopting a healthy lifestyle and getting preventive care are important in promoting health and wellness.  Follow your health care provider's instructions about healthy  diet, exercising, and getting tested or screened for diseases.  Follow your health care provider's instructions on monitoring your cholesterol and blood pressure. This information is not intended to replace advice given to you by your health care provider. Make sure you discuss any questions you have with your health care provider. Document Revised: 02/15/2018 Document Reviewed: 02/15/2018 Elsevier Patient Education  2021 Elsevier Inc.  

## 2020-07-17 NOTE — Progress Notes (Signed)
Subjective:    Patient ID: Maria Winters, female    DOB: [birth year], 60 y.o.   MRN: [id number]  Chief Complaint  Patient presents with  . Medical Management of Chronic Issues   Pt presents to the office today for chronic follow up. She was recently diagnosed with A Fib on 07/02/20. She is currently wearing a two week Holter monitor. She is following a Cardiologists. She is scheduled for ECHO and stress test.  She is taking Eliquis BID.  Diabetes She presents for her follow-up diabetic visit. She has type 2 diabetes mellitus. There are no hypoglycemic associated symptoms. Pertinent negatives for diabetes include no blurred vision and no foot paresthesias. Symptoms are stable. Diabetic complications include heart disease. Risk factors for coronary artery disease include dyslipidemia, diabetes mellitus, sedentary lifestyle and post-menopausal. She is following a generally unhealthy diet. Her overall blood glucose range is 110-130 mg/dl. An ACE inhibitor/angiotensin II receptor blocker is not being taken. Eye exam is current.  Hyperlipidemia This is a chronic problem. The current episode started more than 1 year ago. The problem is controlled. Recent lipid tests were reviewed and are normal. Exacerbating diseases include obesity. Pertinent negatives include no shortness of breath. Current antihyperlipidemic treatment includes statins. Risk factors for coronary artery disease include dyslipidemia, diabetes mellitus, hypertension and a sedentary lifestyle.  Hypertension This is a chronic problem. The current episode started more than 1 year ago. The problem has been resolved since onset. Associated symptoms include malaise/fatigue. Pertinent negatives include no blurred vision, peripheral edema or shortness of breath. The current treatment provides moderate improvement.  Constipation      Review of Systems  Constitutional: Positive for malaise/fatigue.  Eyes: Negative for blurred vision.   Respiratory: Negative for shortness of breath.   Gastrointestinal: Positive for constipation.  All other systems reviewed and are negative.      Objective:   Physical Exam Vitals reviewed.  Constitutional:      General: She is not in acute distress.    Appearance: She is well-developed. She is obese.  HENT:     Head: Normocephalic and atraumatic.     Right Ear: Tympanic membrane normal.     Left Ear: Tympanic membrane normal.  Eyes:     Pupils: Pupils are equal, round, and reactive to light.  Neck:     Thyroid: No thyromegaly.  Cardiovascular:     Rate and Rhythm: Normal rate and regular rhythm.     Heart sounds: Normal heart sounds. No murmur heard.   Pulmonary:     Effort: Pulmonary effort is normal. No respiratory distress.     Breath sounds: Normal breath sounds. No wheezing.  Abdominal:     General: Bowel sounds are normal. There is no distension.     Palpations: Abdomen is soft.     Tenderness: There is no abdominal tenderness.  Musculoskeletal:        General: No tenderness. Normal range of motion.     Cervical back: Normal range of motion and neck supple.  Skin:    General: Skin is warm and dry.  Neurological:     Mental Status: She is alert and oriented to person, place, and time.     Cranial Nerves: No cranial nerve deficit.     Deep Tendon Reflexes: Reflexes are normal and symmetric.  Psychiatric:        Behavior: Behavior normal.        Thought Content: Thought content normal.  Judgment: Judgment normal.       BP 125/78   Pulse 92   Temp (!) 96.7 F (35.9 C) (Temporal)   Ht _0  (1.651 m)   Wt 274 lb 9.6 oz (124.6 kg)   BMI 45.70 kg/m      Assessment & Plan:  Maria Winters comes in today with chief complaint of Medical Management of Chronic Issues   Diagnosis and orders addressed:  1. Type 2 diabetes mellitus without complication, without long-term current use of insulin (HCC) - Bayer DCA Hb A1c Waived - CMP14+EGFR - CBC  with Differential/Platelet - Microalbumin / creatinine urine ratio  2. Morbid obesity ([place]) - CMP14+EGFR - CBC with Differential/Platelet  3. Hyperlipidemia associated with type 2 diabetes mellitus (HCC) - atorvastatin (LIPITOR) 20 MG tablet; Take 1 tablet (20 mg total) by mouth daily.  Dispense: 90 tablet; Refill: 3 - CMP14+EGFR - CBC with Differential/Platelet  4. Gastroesophageal reflux disease without esophagitis - CMP14+EGFR - CBC with Differential/Platelet  5. Constipation, unspecified constipation type - CMP14+EGFR - CBC with Differential/Platelet   Labs pending Health Maintenance reviewed Diet and exercise encouraged  Follow up plan: 6 months  Evelina Dun, FNP

## 2020-07-18 ENCOUNTER — Ambulatory Visit: Payer: 59

## 2020-07-18 DIAGNOSIS — R0602 Shortness of breath: Secondary | ICD-10-CM

## 2020-07-18 DIAGNOSIS — I480 Paroxysmal atrial fibrillation: Secondary | ICD-10-CM

## 2020-07-18 LAB — CBC WITH DIFFERENTIAL/PLATELET
Basophils Absolute: 0.1 10*3/uL (ref 0.0–0.2)
Basos: 1 %
EOS (ABSOLUTE): 0.1 10*3/uL (ref 0.0–0.4)
Eos: 1 %
Hematocrit: 39.5 % (ref 34.0–46.6)
Hemoglobin: 12.7 g/dL (ref 11.1–15.9)
Immature Grans (Abs): 0 10*3/uL (ref 0.0–0.1)
Immature Granulocytes: 0 %
Lymphocytes Absolute: 1.3 10*3/uL (ref 0.7–3.1)
Lymphs: 16 %
MCH: 26.3 pg — ABNORMAL LOW (ref 26.6–33.0)
MCHC: 32.2 g/dL (ref 31.5–35.7)
MCV: 82 fL (ref 79–97)
Monocytes Absolute: 0.4 10*3/uL (ref 0.1–0.9)
Monocytes: 5 %
Neutrophils Absolute: 6 10*3/uL (ref 1.4–7.0)
Neutrophils: 77 %
Platelets: 339 10*3/uL (ref 150–450)
RBC: 4.82 x10E6/uL (ref 3.77–5.28)
RDW: 13.1 % (ref 11.7–15.4)
WBC: 7.9 10*3/uL (ref 3.4–10.8)

## 2020-07-18 LAB — CMP14+EGFR
ALT: 24 IU/L (ref 0–32)
AST: 15 IU/L (ref 0–40)
Albumin/Globulin Ratio: 1.1 — ABNORMAL LOW (ref 1.2–2.2)
Albumin: 3.8 g/dL (ref 3.8–4.8)
Alkaline Phosphatase: 97 IU/L (ref 44–121)
BUN/Creatinine Ratio: 12 (ref 12–28)
BUN: 10 mg/dL (ref 8–27)
Bilirubin Total: 0.3 mg/dL (ref 0.0–1.2)
CO2: 25 mmol/L (ref 20–29)
Calcium: 9.4 mg/dL (ref 8.7–10.3)
Chloride: 101 mmol/L (ref 96–106)
Creatinine, Ser: 0.86 mg/dL (ref 0.57–1.00)
Globulin, Total: 3.5 g/dL (ref 1.5–4.5)
Glucose: 185 mg/dL — ABNORMAL HIGH (ref 65–99)
Potassium: 4.3 mmol/L (ref 3.5–5.2)
Sodium: 139 mmol/L (ref 134–144)
Total Protein: 7.3 g/dL (ref 6.0–8.5)
eGFR: 77 mL/min/{1.73_m2} (ref >59)

## 2020-07-18 LAB — MICROALBUMIN / CREATININE URINE RATIO
Creatinine, Urine: 239.6 mg/dL
Microalb/Creat Ratio: 11 mg/g creat (ref 0–29)
Microalbumin, Urine: 27.1 ug/mL

## 2020-08-06 ENCOUNTER — Other Ambulatory Visit: Payer: Self-pay

## 2020-08-06 ENCOUNTER — Ambulatory Visit: Payer: 59

## 2020-08-06 DIAGNOSIS — R0602 Shortness of breath: Secondary | ICD-10-CM

## 2020-08-06 DIAGNOSIS — I480 Paroxysmal atrial fibrillation: Secondary | ICD-10-CM

## 2020-08-08 ENCOUNTER — Other Ambulatory Visit: Payer: 59

## 2020-08-08 ENCOUNTER — Other Ambulatory Visit: Payer: Self-pay

## 2020-08-09 LAB — LIPID PANEL WITH LDL/HDL RATIO
Cholesterol, Total: 149 mg/dL (ref 100–199)
HDL: 74 mg/dL (ref >39)
LDL Chol Calc (NIH): 64 mg/dL (ref 0–99)
LDL/HDL Ratio: 0.9 ratio (ref 0.0–3.2)
Triglycerides: 52 mg/dL (ref 0–149)
VLDL Cholesterol Cal: 11 mg/dL (ref 5–40)

## 2020-08-14 NOTE — Progress Notes (Deleted)
Primary Physician/Referring:  Sharion Balloon, FNP  Patient ID: Maria Winters, female    DOB: 12-29-1960, 60 y.o.   MRN: [id number]  No chief complaint on file.  HPI:    Maria Winters  is a 60 y.o. African-American obese female with history of diabetes, hypertension, hyperlipidemia.  Although patient has lost weight over the last several months and A1c as well as lipid and blood pressure control have improved. She quit smoking in 1994.  Patient diagnosed with paroxysmal atrial fibrillation 06/2020 on cardiac monitor while in emergency department.  ***Patient presents for 4 week follow up of atrial fibrillation, dyspnea on exertion, and results of cardiac test results.  At last visit shared decision was to start patient on Eliquis 5 mg twice daily for anticoagulation.  Also ordered nuclear stress test and echocardiogram, as well as 2-week cardiac monitor to assess atrial fibrillation burden.  ***Echo - Grade 1 DD, trace AR. Monitor - no a fib/flutter, sx = PACs/PVCs, SVT. Stress test - low tolerance due to dyspnea, norm perfusion, low risk.   She was seen in [facility] emergency department on 07/02/2020 with complaints of palpitations and shortness of breath.  Cardiac monitoring while in the emergency department revealed episodes of atrial fibrillation.  Patient was therefore started on low-dose metoprolol for rate control, however anticoagulation was held due to CHA2DS2-VASc score of 2.  Patient now presents for follow-up and to establish care in our office.  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  Regularly walks 30 minutes/day approximately 6 days/week without issue.  She does note that over the last several weeks she has occasionally been awakened with palpitations and feelings of shortness of breath.  Typically these palpitation episodes are brief lasting less than 1 minute, however they have lasted as long as 3 hours with associated shortness of breath and  fatigue.  Since initiation of metoprolol patient has only had a single episode of palpitations over the last 2 days.  She denies chest pain, syncope, near syncope, dizziness, lightheadedness.  Denies orthopnea, PND, leg swelling.  Past Medical History:  Diagnosis Date   Abnormal Pap smear of cervix    in her 20's-- hx of conization/LEEP procedure-normal paps since   Abnormal uterine bleeding    Anemia    ~2005   Arthritis    Chest pain    Fibroid    GERD (gastroesophageal reflux disease)    Helicobacter pylori (H. pylori)    Psoriasis    Reflux    STD (sexually transmitted disease) 1994   Tx'd for Chlamydia   Vision abnormalities    tunnel vision   Past Surgical History:  Procedure Laterality Date   cervix dilated     CHOLECYSTECTOMY N/A 09/13/2012   Procedure: LAPAROSCOPIC CHOLECYSTECTOMY WITH INTRAOPERATIVE CHOLANGIOGRAM;  Surgeon: Pedro Earls, MD;  Location: [facility] ORS;  Service: General;  Laterality: N/A;   DILATION AND CURETTAGE OF UTERUS     DILATION AND CURETTAGE, DIAGNOSTIC / [place]   with Conization   ESOPHAGOGASTRODUODENOSCOPY N/A 05/11/2012   Procedure: ESOPHAGOGASTRODUODENOSCOPY (EGD);  Surgeon: Rogene Houston, MD;  Location: [facility] SUITE;  Service: Endoscopy;  Laterality: N/A;  125   LEEP     Family History  Problem Relation Age of Onset   Diabetes Mother    Hypertension Father    Healthy Sister    Hypertension Sister    Hypertension Brother    Gout Brother    Heart attack  Brother    Breast cancer Sister 15       lumpectomy   Sickle cell trait Brother    Diabetes Maternal Grandmother        with 2 BKA   Stroke Maternal Grandmother    Breast cancer Maternal Aunt     Social History   Tobacco Use   Smoking status: Former    Packs/day: 0.25    Years: 10.00    Pack years: 2.50    Types: Cigarettes    Quit date: 06/20/1992    Years since quitting: 28.1   Smokeless tobacco: Never   Tobacco comments:    Quit in 1995  Substance Use Topics    Alcohol use: Not Currently    Alcohol/week: 1.0 standard drink    Types: 1 Glasses of wine per week    Comment: Patient may drink 2 glasses of wine a year   Marital Status: Single   ROS  Review of Systems  Constitutional: Positive for malaise/fatigue. Negative for weight gain.  Cardiovascular:  Positive for palpitations. Negative for chest pain, claudication, leg swelling, near-syncope, orthopnea, paroxysmal nocturnal dyspnea and syncope.  Respiratory:  Positive for shortness of breath.   Hematologic/Lymphatic: Does not bruise/bleed easily.  Gastrointestinal:  Negative for melena.  Neurological:  Negative for dizziness and weakness.   Objective  There were no vitals taken for this visit.  Vitals with BMI 07/17/2020 07/04/2020 07/02/2020  Height 5\' 5"  5\' 5"  -  Weight 274 lbs 10 oz 274 lbs 13 oz -  BMI 38.1 01.75 -  Systolic 102 585 277  Diastolic 78 73 69  Pulse 92 91 75      Physical Exam Vitals reviewed.  HENT:     Head: Normocephalic and atraumatic.  Cardiovascular:     Rate and Rhythm: Normal rate and regular rhythm.     Pulses: Intact distal pulses.     Heart sounds: S1 normal and S2 normal. No murmur heard.   No gallop.  Pulmonary:     Effort: Pulmonary effort is normal. No respiratory distress.     Breath sounds: No wheezing, rhonchi or rales.  Abdominal:     General: Bowel sounds are normal.     Palpations: Abdomen is soft.  Musculoskeletal:     Right lower leg: No edema.     Left lower leg: No edema.  Skin:    General: Skin is warm and dry.  Neurological:     Mental Status: She is alert.    Laboratory examination:   Recent Labs    08/24/19 0806 07/02/20 1442 07/17/20 1455  NA 141 138 139  K 4.2 3.7 4.3  CL 105 104 101  CO2 22 26 25   GLUCOSE 126* 101* 185*  BUN 7* 15 10  CREATININE 0.88 0.76 0.86  CALCIUM 8.6* 8.7* 9.4  GFRNONAA 72 >60  --   GFRAA 83  --   --     CrCl cannot be calculated (Patient's most recent lab result is older than the  maximum 21 days allowed.).  CMP Latest Ref Rng & Units 07/17/2020 07/02/2020 08/24/2019  Glucose 65 - 99 mg/dL 185(H) 101(H) 126(H)  BUN 8 - 27 mg/dL 10 15 7(L)  Creatinine 0.57 - 1.00 mg/dL 0.86 0.76 0.88  Sodium 134 - 144 mmol/L 139 138 141  Potassium 3.5 - 5.2 mmol/L 4.3 3.7 4.2  Chloride 96 - 106 mmol/L 101 104 105  CO2 20 - 29 mmol/L 25 26 22   Calcium 8.7 - 10.3 mg/dL  9.4 8.7(L) 8.6(L)  Total Protein 6.0 - 8.5 g/dL 7.3 - 7.1  Total Bilirubin 0.0 - 1.2 mg/dL 0.3 - 0.3  Alkaline Phos 44 - 121 IU/L 97 - 97  AST 0 - 40 IU/L 15 - 15  ALT 0 - 32 IU/L 24 - 27   CBC Latest Ref Rng & Units 07/17/2020 07/02/2020 08/24/2019  WBC 3.4 - 10.8 x10E3/uL 7.9 5.8 5.2  Hemoglobin 11.1 - 15.9 g/dL 12.7 12.0 12.6  Hematocrit 34.0 - 46.6 % 39.5 38.9 37.9  Platelets 150 - 450 x10E3/uL 339 332 323    Lipid Panel Recent Labs    08/24/19 0806 08/08/20 0959  CHOL 164 149  TRIG 61 52  LDLCALC 94 64  HDL 58 74  CHOLHDL 2.8  --      HEMOGLOBIN A1C Lab Results  Component Value Date   HGBA1C 6.9 07/17/2020   TSH Recent Labs    07/02/20 1442  TSH 0.711     External labs:  None  Allergies   Allergies  Allergen Reactions   Esomeprazole Magnesium     Patient states that the Nexium caused tremors      Medications Prior to Visit:   Outpatient Medications Prior to Visit  Medication Sig Dispense Refill   apixaban (ELIQUIS) 5 MG TABS tablet Take 1 tablet (5 mg total) by mouth 2 (two) times daily. 60 tablet 3   atorvastatin (LIPITOR) 20 MG tablet Take 1 tablet (20 mg total) by mouth daily. 90 tablet 3   clobetasol ointment (TEMOVATE) 0.05 % Apply topically at affected area daily until clear 30 g 5   dexamethasone 0.5 MG/5ML elixir Take 0.5 mg by mouth 4 (four) times daily.     fluocinonide gel (LIDEX) 0.05 % Apply topically 4 (four) times daily.     meclizine (ANTIVERT) 25 MG tablet Take 1 tablet (25 mg total) by mouth 2 (two) times daily as needed for dizziness. 90 tablet 4   metoprolol  succinate (TOPROL-XL) 25 MG 24 hr tablet Take 1 tablet (25 mg total) by mouth daily. 30 tablet 0   naproxen sodium (ALEVE) 220 MG tablet Take 220 mg by mouth.     No facility-administered medications prior to visit.     Final Medications at End of Visit    No outpatient medications have been marked as taking for the 08/15/20 encounter (Appointment) with Rayetta Pigg, Boyd [place] C, PA-C.    Radiology:   [facility] Chest Portable 1 View 07/02/2020 EKG leads project over the chest. Trachea midline. Cardiomediastinal contours are top normal to mildly enlarged, accentuated by portable technique. Mild central pulmonary vascular engorgement may be present. No consolidation. No sign of pleural effusion. Subtle opacities are seen in the RIGHT and LEFT mid chest. Also at the LEFT lung base along the LEFT heart border. On limited assessment no acute skeletal process.   IMPRESSION: Mild cardiac enlargement with signs of central pulmonary vascular engorgement. Subtle increased interstitial markings and vague opacities could also reflect asymmetric edema or viral infection. Consider PA and lateral chest radiograph to ensure resolution on follow-up.   Cardiac Studies:   PCV MYOCARDIAL PERFUSION WO LEXISCAN 08/06/2020 Normal ECG stress. The patient exercised for 3 minutes and 2 seconds of a Bruce protocol, achieving approximately 4.66 METs.  Markedly reduced exercise tolerance.  Symptoms of dyspnea.  Normal blood pressure response. Mild breast attenuation noted in the inferior wall in RAW images. Myocardial perfusion is normal. Overall LV systolic function is normal without regional wall motion abnormalities. Stress LV  EF: 66%. No previous exam available for comparison. Low risk study.  Ambulatory cardiac telemetry 07/04/2020 - 07/18/2020: Predominant rhythm was sinus with maximum heart rate 40 bpm, maximum heart rate 179 bpm, average heart rate of 70 bpm.  Patient's symptoms correlated with normal sinus rhythm, PACs, and  PVCs.  Patient on 48 episodes of supraventricular tachycardia with longest lasting 14.6 seconds and maximum heart rate 179 bpm.  Some episodes of SVT may be possible atrial tachycardia.  Rare PACs and PVCs.  Ventricular bigeminy noted.  No atrial fibrillation or flutter, high degree AV block, or ventricular tachycardia.  PCV ECHOCARDIOGRAM COMPLETE 58/11/9831 Normal LV systolic function with EF 66%. Left ventricle cavity is normal in size. Normal left ventricular wall thickness. Normal global wall motion. Doppler evidence of grade I (impaired) diastolic dysfunction, normal LAP. Calculated EF 66%. Trileaflet aortic valve.  Trace aortic regurgitation.  EKG:   EKG 07/04/2020: Sinus rhythm at a rate of 64 bpm.  Left atrial enlargement.  Normal axis.  No evidence of ischemia or underlying injury pattern.  Assessment   No diagnosis found.    There are no discontinued medications.   No orders of the defined types were placed in this encounter.  This patients CHA2DS2-VASc Score 2 (F, DM) and yearly risk of stroke 2.2%.   Recommendations:   Maria Winters is a 60 y.o.  African-American obese female with history of diabetes, hypertension, hyperlipidemia.  Although patient has lost weight over the last several months and A1c as well as lipid and blood pressure control have improved. She quit smoking in 1994.  Patient diagnosed with paroxysmal atrial fibrillation 06/2020 on cardiac monitor while in emergency department.  ***Patient presents for 4 week follow up of atrial fibrillation, dyspnea on exertion, and results of cardiac test results.  At last visit shared decision was to start patient on Eliquis 5 mg twice daily for anticoagulation.  Also ordered nuclear stress test and echocardiogram, as well as 2-week cardiac monitor to assess atrial fibrillation burden.  ***Echo - Grade 1 DD, trace AR. Monitor - no a fib/flutter, sx = PACs/PVCs, SVT. Stress test - low tolerance due to dyspnea, norm  perfusion, low risk.   ***  Patient presents newly diagnosed with paroxysmal atrial fibrillation in 06/2020.  EKG today reveals sinus rhythm at a rate of 64 bpm.  Patient is tolerating metoprolol without issue, will continue this.  Discussed at length with patient regarding indications, risks, benefits of anticoagulation given paroxysmal atrial fibrillation.  Patient verbalized understanding of risks, benefits, and indications.  Shared decision was to proceed with initiation of anticoagulation.  We will start Eliquis 5 mg twice daily.  Patient will notify our office immediately of any bleeding diathesis.  Also recommend further evaluation of underlying CAD as etiology of atrial fibrillation given patient's cardiovascular risk factors.  We will obtain nuclear stress test.  We will also obtain echocardiogram to evaluate left ventricular function.  We will also obtain 2-week cardiac monitor to assess patient's underlying atrial fibrillation burden as well as for any other underlying cardiac arrhythmias.  Patient's 10-year ASCVD risk is 5.5%, recommend continuation of atorvastatin.  We will plan to repeat lipid profile testing.  Follow-up in 4 weeks, sooner if needed, for atrial fibrillation, dyspnea on exertion, results of cardiac testing.   Alethia Berthold, PA-C 08/14/2020, 9:28 AM Office: [phone]

## 2020-08-15 ENCOUNTER — Ambulatory Visit: Payer: 59 | Admitting: Student

## 2020-08-15 ENCOUNTER — Encounter: Payer: Self-pay | Admitting: Student

## 2020-08-15 ENCOUNTER — Other Ambulatory Visit: Payer: Self-pay

## 2020-08-15 VITALS — BP 120/72 | HR 83 | Temp 98.3°F | Resp 16 | Ht 65.0 in | Wt 279.0 lb

## 2020-08-15 DIAGNOSIS — I493 Ventricular premature depolarization: Secondary | ICD-10-CM

## 2020-08-15 DIAGNOSIS — I471 Supraventricular tachycardia: Secondary | ICD-10-CM

## 2020-08-15 DIAGNOSIS — I491 Atrial premature depolarization: Secondary | ICD-10-CM

## 2020-08-15 DIAGNOSIS — R002 Palpitations: Secondary | ICD-10-CM

## 2020-08-15 DIAGNOSIS — I480 Paroxysmal atrial fibrillation: Secondary | ICD-10-CM

## 2020-08-15 MED ORDER — METOPROLOL SUCCINATE ER 25 MG PO TB24: 25.0000 mg | ORAL_TABLET | Freq: Every day | ORAL | 3 refills | Status: DC

## 2020-08-15 NOTE — Progress Notes (Signed)
Primary Physician/Referring:  Sharion Balloon, FNP  Patient ID: [name], female    DOB: [birth year], 60 y.o.   MRN: [id number]  Chief Complaint  Patient presents with   Atrial Fibrillation   Follow-up   Results    Stress, echo , and monitor    HPI:    Maria Winters  is a 60 y.o. African-American obese female with history of diabetes, hypertension, hyperlipidemia.  Although patient has lost weight over the last several months and A1c as well as lipid and blood pressure control have improved. She quit smoking in 1994.  Patient diagnosed with paroxysmal atrial fibrillation 06/2020 on cardiac monitor while in emergency department.  Patient presents for 4 week follow up of atrial fibrillation, dyspnea on exertion, and results of cardiac test results.  At last visit shared decision was to start patient on Eliquis 5 mg twice daily for anticoagulation.  Also ordered nuclear stress test and echocardiogram, as well as 2-week cardiac monitor to assess atrial fibrillation burden.  Echocardiogram revealed normal LVEF and stress test was low risk.  Patient's cardiac monitor showed no evidence of atrial fibrillation/flutter, and her symptoms correlated with PACs/PVCs as well as SVT.  Patient states she is feeling very well overall.  She states she has had 2 episodes in the last 4 to 5 days of brief palpitations lasting several seconds without associated symptoms.  Denies chest pain, dyspnea, syncope, near syncope, dizziness.  Denies orthopnea, PND, leg swelling.  Past Medical History:  Diagnosis Date   Abnormal Pap smear of cervix    in her 20's-- hx of conization/LEEP procedure-normal paps since   Abnormal uterine bleeding    Anemia    ~2005   Arthritis    Chest pain    Fibroid    GERD (gastroesophageal reflux disease)    Helicobacter pylori (H. pylori)    Psoriasis    Reflux    STD (sexually transmitted disease) 1994   Tx'd for Chlamydia   Vision abnormalities    tunnel vision    Past Surgical History:  Procedure Laterality Date   cervix dilated     CHOLECYSTECTOMY N/A 09/13/2012   Procedure: LAPAROSCOPIC CHOLECYSTECTOMY WITH INTRAOPERATIVE CHOLANGIOGRAM;  Surgeon: Pedro Earls, MD;  Location: [facility];  Service: General;  Laterality: N/A;   DILATION AND CURETTAGE OF UTERUS     DILATION AND CURETTAGE, DIAGNOSTIC / [place]   with Conization   ESOPHAGOGASTRODUODENOSCOPY N/A 05/11/2012   Procedure: ESOPHAGOGASTRODUODENOSCOPY (EGD);  Surgeon: Rogene Houston, MD;  Location: [facility];  Service: Endoscopy;  Laterality: N/A;  89   LEEP     Family History  Problem Relation Age of Onset   Diabetes Mother    Hypertension Father    Healthy Sister    Hypertension Sister    Hypertension Brother    Gout Brother    Heart attack Brother    Breast cancer Sister 42       lumpectomy   Sickle cell trait Brother    Diabetes Maternal Grandmother        with 2 BKA   Stroke Maternal Grandmother    Breast cancer Maternal Aunt     Social History   Tobacco Use   Smoking status: Former    Packs/day: 0.25    Years: 10.00    Pack years: 2.50    Types: Cigarettes    Quit date: 06/20/1992    Years since quitting: 28.1   Smokeless tobacco: Never  Tobacco comments:    Quit in 1995  Substance Use Topics   Alcohol use: Not Currently    Alcohol/week: 1.0 standard drink    Types: 1 Glasses of wine per week    Comment: Patient may drink 2 glasses of wine a year   Marital Status: Single   ROS  Review of Systems  Constitutional: Negative for malaise/fatigue and weight gain.  Cardiovascular:  Positive for palpitations. Negative for chest pain, claudication, leg swelling, near-syncope, orthopnea, paroxysmal nocturnal dyspnea and syncope.  Respiratory:  Negative for shortness of breath.   Hematologic/Lymphatic: Does not bruise/bleed easily.  Gastrointestinal:  Negative for melena.  Neurological:  Negative for dizziness and weakness.   Objective  Blood  pressure 120/72, pulse 83, temperature 98.3 F (36.8 C), resp. rate 16, height 5\' 5"  (1.651 m), weight 279 lb (126.6 kg), SpO2 96 %.  Vitals with BMI 08/15/2020 07/17/2020 07/04/2020  Height 5\' 5"  5\' 5"  5\' 5"   Weight 279 lbs 274 lbs 10 oz 274 lbs 13 oz  BMI 46.43 05.3 97.67  Systolic 341 937 902  Diastolic 72 78 73  Pulse 83 92 91      Physical Exam Vitals reviewed.  HENT:     Head: Normocephalic and atraumatic.  Cardiovascular:     Rate and Rhythm: Normal rate and regular rhythm.     Pulses: Intact distal pulses.     Heart sounds: S1 normal and S2 normal. No murmur heard.   No gallop.  Pulmonary:     Effort: Pulmonary effort is normal. No respiratory distress.     Breath sounds: No wheezing, rhonchi or rales.  Musculoskeletal:     Right lower leg: No edema.     Left lower leg: No edema.  Skin:    General: Skin is warm and dry.  Neurological:     Mental Status: She is alert.    Laboratory examination:   Recent Labs    08/24/19 0806 07/02/20 1442 07/17/20 1455  NA 141 138 139  K 4.2 3.7 4.3  CL 105 104 101  CO2 22 26 25   GLUCOSE 126* 101* 185*  BUN 7* 15 10  CREATININE 0.88 0.76 0.86  CALCIUM 8.6* 8.7* 9.4  GFRNONAA 72 >60  --   GFRAA 83  --   --    CrCl cannot be calculated (Patient's most recent lab result is older than the maximum 21 days allowed.).  CMP Latest Ref Rng & Units 07/17/2020 07/02/2020 08/24/2019  Glucose 65 - 99 mg/dL 185(H) 101(H) 126(H)  BUN 8 - 27 mg/dL 10 15 7(L)  Creatinine 0.57 - 1.00 mg/dL 0.86 0.76 0.88  Sodium 134 - 144 mmol/L 139 138 141  Potassium 3.5 - 5.2 mmol/L 4.3 3.7 4.2  Chloride 96 - 106 mmol/L 101 104 105  CO2 20 - 29 mmol/L 25 26 22   Calcium 8.7 - 10.3 mg/dL 9.4 8.7(L) 8.6(L)  Total Protein 6.0 - 8.5 g/dL 7.3 - 7.1  Total Bilirubin 0.0 - 1.2 mg/dL 0.3 - 0.3  Alkaline Phos 44 - 121 IU/L 97 - 97  AST 0 - 40 IU/L 15 - 15  ALT 0 - 32 IU/L 24 - 27   CBC Latest Ref Rng & Units 07/17/2020 07/02/2020 08/24/2019  WBC 3.4 - 10.8  x10E3/uL 7.9 5.8 5.2  Hemoglobin 11.1 - 15.9 g/dL 12.7 12.0 12.6  Hematocrit 34.0 - 46.6 % 39.5 38.9 37.9  Platelets 150 - 450 x10E3/uL 339 332 323    Lipid Panel Recent Labs  08/24/19 0806 08/08/20 0959  CHOL 164 149  TRIG 61 52  LDLCALC 94 64  HDL 58 74  CHOLHDL 2.8  --     HEMOGLOBIN A1C Lab Results  Component Value Date   HGBA1C 6.9 07/17/2020   TSH Recent Labs    07/02/20 1442  TSH 0.711    External labs:  None  Allergies   Allergies  Allergen Reactions   Esomeprazole Magnesium     Patient states that the Nexium caused tremors      Medications Prior to Visit:   Outpatient Medications Prior to Visit  Medication Sig Dispense Refill   acetaminophen (TYLENOL) 325 MG tablet Take 650 mg by mouth every 6 (six) hours as needed.     atorvastatin (LIPITOR) 20 MG tablet Take 1 tablet (20 mg total) by mouth daily. 90 tablet 3   clobetasol ointment (TEMOVATE) 0.05 % Apply topically at affected area daily until clear 30 g 5   dexamethasone 0.5 MG/5ML elixir Take 0.5 mg by mouth 4 (four) times daily.     fluocinonide gel (LIDEX) 0.05 % Apply topically 4 (four) times daily.     meclizine (ANTIVERT) 25 MG tablet Take 1 tablet (25 mg total) by mouth 2 (two) times daily as needed for dizziness. 90 tablet 4   apixaban (ELIQUIS) 5 MG TABS tablet Take 1 tablet (5 mg total) by mouth 2 (two) times daily. 60 tablet 3   metoprolol succinate (TOPROL-XL) 25 MG 24 hr tablet Take 1 tablet (25 mg total) by mouth daily. 30 tablet 0   naproxen sodium (ALEVE) 220 MG tablet Take 220 mg by mouth.     No facility-administered medications prior to visit.     Final Medications at End of Visit    Current Meds  Medication Sig   acetaminophen (TYLENOL) 325 MG tablet Take 650 mg by mouth every 6 (six) hours as needed.   atorvastatin (LIPITOR) 20 MG tablet Take 1 tablet (20 mg total) by mouth daily.   clobetasol ointment (TEMOVATE) 0.05 % Apply topically at affected area daily until clear    dexamethasone 0.5 MG/5ML elixir Take 0.5 mg by mouth 4 (four) times daily.   fluocinonide gel (LIDEX) 0.05 % Apply topically 4 (four) times daily.   meclizine (ANTIVERT) 25 MG tablet Take 1 tablet (25 mg total) by mouth 2 (two) times daily as needed for dizziness.   [DISCONTINUED] apixaban (ELIQUIS) 5 MG TABS tablet Take 1 tablet (5 mg total) by mouth 2 (two) times daily.   [DISCONTINUED] metoprolol succinate (TOPROL-XL) 25 MG 24 hr tablet Take 1 tablet (25 mg total) by mouth daily.    Radiology:   [facility] Chest Portable 1 View 07/02/2020 EKG leads project over the chest. Trachea midline. Cardiomediastinal contours are top normal to mildly enlarged, accentuated by portable technique. Mild central pulmonary vascular engorgement may be present. No consolidation. No sign of pleural effusion. Subtle opacities are seen in the RIGHT and LEFT mid chest. Also at the LEFT lung base along the LEFT heart border. On limited assessment no acute skeletal process.   IMPRESSION: Mild cardiac enlargement with signs of central pulmonary vascular engorgement. Subtle increased interstitial markings and vague opacities could also reflect asymmetric edema or viral infection. Consider PA and lateral chest radiograph to ensure resolution on follow-up.   Cardiac Studies:   [facility] MYOCARDIAL PERFUSION WO LEXISCAN 08/06/2020 Normal ECG stress. The patient exercised for 3 minutes and 2 seconds of a Bruce protocol, achieving approximately 4.66 METs.  Markedly reduced exercise  tolerance.  Symptoms of dyspnea.  Normal blood pressure response. Mild breast attenuation noted in the inferior wall in RAW images. Myocardial perfusion is normal. Overall LV systolic function is normal without regional wall motion abnormalities. Stress LV EF: 66%. No previous exam available for comparison. Low risk study.  Ambulatory cardiac telemetry 07/04/2020 - 07/18/2020: Predominant rhythm was sinus with maximum heart rate 40 bpm, maximum heart rate  179 bpm, average heart rate of 70 bpm.  Patient's symptoms correlated with normal sinus rhythm, PACs, and PVCs.  Patient on 48 episodes of supraventricular tachycardia with longest lasting 14.6 seconds and maximum heart rate 179 bpm.  Some episodes of SVT may be possible atrial tachycardia.  Rare PACs and PVCs.  Ventricular bigeminy noted.  No atrial fibrillation or flutter, high degree AV block, or ventricular tachycardia.  PCV ECHOCARDIOGRAM COMPLETE 67/02/4579 Normal LV systolic function with EF 66%. Left ventricle cavity is normal in size. Normal left ventricular wall thickness. Normal global wall motion. Doppler evidence of grade I (impaired) diastolic dysfunction, normal LAP. Calculated EF 66%. Trileaflet aortic valve.  Trace aortic regurgitation.  EKG:   EKG 07/04/2020: Sinus rhythm at a rate of 64 bpm.  Left atrial enlargement.  Normal axis.  No evidence of ischemia or underlying injury pattern.  Assessment     ICD-10-CM   1. Paroxysmal atrial fibrillation (HCC)  I48.0     2. Palpitations  R00.2     3. Supraventricular tachycardia (HCC)  I47.1     4. PVC (premature ventricular contraction)  I49.3     5. PAC (premature atrial contraction)  I49.1         Medications Discontinued During This Encounter  Medication Reason   naproxen sodium (ALEVE) 220 MG tablet Error   apixaban (ELIQUIS) 5 MG TABS tablet Discontinued by provider   metoprolol succinate (TOPROL-XL) 25 MG 24 hr tablet Reorder     Meds ordered this encounter  Medications   metoprolol succinate (TOPROL-XL) 25 MG 24 hr tablet    Sig: Take 1 tablet (25 mg total) by mouth daily.    Dispense:  90 tablet    Refill:  3   This patients CHA2DS2-VASc Score 2 (F, DM) and yearly risk of stroke 2.2%.   Recommendations:   Maria Winters is a 60 y.o.  African-American obese female with history of diabetes, hypertension, hyperlipidemia.  Although patient has lost weight over the last several months and A1c as well as  lipid and blood pressure control have improved. She quit smoking in 1994.  Patient diagnosed with paroxysmal atrial fibrillation 06/2020 on cardiac monitor while in emergency department.  Patient presents for 4 week follow up of atrial fibrillation, dyspnea on exertion, and results of cardiac test results.  At last visit shared decision was to start patient on Eliquis 5 mg twice daily for anticoagulation.  Also ordered nuclear stress test and echocardiogram, as well as 2-week cardiac monitor to assess atrial fibrillation burden.   Reviewed and discussed with patient regarding results of echocardiogram, stress test, and ambulatory cardiac telemetry, details above.  Echocardiogram revealed grade 1 diastolic dysfunction and trace aortic regurgitation, otherwise was unremarkable.  Stress test was low risk, although patient did have reduced exercise tolerance due to dyspnea, no evidence of underlying ischemic etiology of atrial fibrillation.  Patient's monitor revealed no episodes of atrial fibrillation, however she did have symptomatic PACs/PVCs and episodes of ventricular tachycardia.  Discussed with patient regarding undergoing sleep evaluation, however she prefers to discuss with her PCP further  and therefore refuses referral to sleep specialist at this time.  In regard to anticoagulation, patient's CHA2DS2-VASc score is 2 and she had no episodes of atrial fibrillation on recent cardiac monitor.  Discussed with patient stroke risk associated with atrial fibrillation and that recommendation for females is CHA2DS2-VASc score greater than or equal to 3 to proceed with long-term anticoagulation.  Also discussed risks versus benefits of anticoagulation in and of itself.  Patient verbalized understanding of stroke risk as well as bleeding risk and shared decision was to discontinue Eliquis at this time.  Notably patient is 60 years old, and when she turns 60 will have a CHA2DS2-VASc score of 3.  Advised patient of  this, and she wishes to reevaluate need for anticoagulation at a later date.  Will not make other changes to patient's medications at this time.  Blood pressure is well controlled.  Will defer further management of hypertension and hyperlipidemia to PCP.  Follow-up in 6 months, sooner if needed, for paroxysmal atrial fibrillation.   Alethia Berthold, PA-C 08/15/2020, 2:47 PM Office: [phone]

## 2020-08-25 ENCOUNTER — Telehealth: Payer: Self-pay | Admitting: Dermatology

## 2020-08-25 NOTE — Telephone Encounter (Signed)
Phone call to patient with Dr. Tafeen's recommendations. Voicemail left for patient to give the office a call back.  

## 2020-08-25 NOTE — Telephone Encounter (Signed)
Phone call from patient returning our call. Dr. Onalee Hua recommendations given to patient. Patient aware.

## 2020-08-25 NOTE — Telephone Encounter (Signed)
Patient calling to ask if her Clobetasol and new medications might be causing psoriasis to break out on back. She states that she has never had this kind of problem before. Her new medications are Lipitor and metoprolol. She states that she is willing to make appointment but wanted to see if this could be answered with a phone call first since she was seen here not long ago.

## 2020-08-27 ENCOUNTER — Other Ambulatory Visit: Payer: Self-pay | Admitting: Family

## 2020-08-27 DIAGNOSIS — E785 Hyperlipidemia, unspecified: Secondary | ICD-10-CM

## 2020-08-27 DIAGNOSIS — E1169 Type 2 diabetes mellitus with other specified complication: Secondary | ICD-10-CM

## 2020-09-04 ENCOUNTER — Ambulatory Visit: Payer: 59 | Admitting: Cardiology

## 2020-09-26 ENCOUNTER — Other Ambulatory Visit: Payer: Self-pay | Admitting: Family

## 2020-09-26 DIAGNOSIS — E785 Hyperlipidemia, unspecified: Secondary | ICD-10-CM

## 2020-09-26 DIAGNOSIS — E1169 Type 2 diabetes mellitus with other specified complication: Secondary | ICD-10-CM

## 2020-10-30 ENCOUNTER — Ambulatory Visit: Payer: 59 | Admitting: Dermatology

## 2020-12-12 ENCOUNTER — Other Ambulatory Visit: Payer: Self-pay | Admitting: Dermatology

## 2020-12-12 DIAGNOSIS — L409 Psoriasis, unspecified: Secondary | ICD-10-CM

## 2020-12-15 ENCOUNTER — Other Ambulatory Visit: Payer: Self-pay | Admitting: Family

## 2020-12-15 DIAGNOSIS — E1169 Type 2 diabetes mellitus with other specified complication: Secondary | ICD-10-CM

## 2020-12-15 DIAGNOSIS — E785 Hyperlipidemia, unspecified: Secondary | ICD-10-CM

## 2020-12-15 DIAGNOSIS — R42 Dizziness and giddiness: Secondary | ICD-10-CM

## 2020-12-15 MED ORDER — METOPROLOL SUCCINATE ER 25 MG PO TB24: 25.0000 mg | ORAL_TABLET | Freq: Every day | ORAL | 0 refills | Status: DC

## 2020-12-15 MED ORDER — ATORVASTATIN CALCIUM 20 MG PO TABS: 20.0000 mg | ORAL_TABLET | Freq: Every day | ORAL | 0 refills | Status: DC

## 2020-12-15 MED ORDER — MECLIZINE HCL 25 MG PO TABS: 25.0000 mg | ORAL_TABLET | Freq: Two times a day (BID) | ORAL | 0 refills | Status: DC | PRN

## 2020-12-15 NOTE — Telephone Encounter (Signed)
  Prescription Request  12/15/2020  Is this a "Controlled Substance" medicine? no Have you seen your PCP in the last 2 weeks?  no If YES, route message to pool  -  If NO, patient needs to be scheduled for appointment.  What is the name of the medication or equipment?metoprolol succinate (TOPROL-XL) 25 MG 24 hr tablet  atorvastatin (LIPITOR) 20 MG tablet  meclizine (ANTIVERT) 25 MG tablet   Have you contacted your pharmacy to request a refill? No this is being sent to a new pharmacy pt needs 90 day supply   Which pharmacy would you like this sent to? Pt needs hard copy of these prescriptions to mail to [facility] she would like to pick them up    Patient notified that their request is being sent to the clinical staff for review and that they should receive a response within 2 business days.

## 2020-12-29 ENCOUNTER — Other Ambulatory Visit: Payer: Self-pay

## 2020-12-29 ENCOUNTER — Ambulatory Visit: Payer: BC Managed Care – PPO | Admitting: Dermatology

## 2020-12-29 DIAGNOSIS — L409 Psoriasis, unspecified: Secondary | ICD-10-CM | POA: Diagnosis not present

## 2020-12-29 MED ORDER — CLOBETASOL PROPIONATE 0.05 % EX OINT: TOPICAL_OINTMENT | CUTANEOUS | 5 refills | Status: DC

## 2020-12-31 ENCOUNTER — Telehealth: Payer: Self-pay | Admitting: Family

## 2020-12-31 NOTE — Telephone Encounter (Signed)
Pt called stating that she is going to start using [place] to get all of her Rx's mailed to her but has to fill out the registration form and send it to them first.  Once that is done, she wants [place] to send all of her Rx's to them electronically.

## 2020-12-31 NOTE — Telephone Encounter (Signed)
FYI

## 2021-01-11 ENCOUNTER — Encounter: Payer: Self-pay | Admitting: Dermatology

## 2021-01-11 NOTE — Progress Notes (Signed)
   Follow-Up Visit   Subjective  Maria Winters is a 59 y.o. female who presents for the following: Psoriasis (Here for follow up on psoriasis. On arms and legs.  Patient needs refill on her medication. Clobetasol ointment. ).  Psoriasis Location:  Duration:  Quality:  Associated Signs/Symptoms: Modifying Factors:  Severity:  Timing: Context:   Objective  Well appearing patient in no apparent distress; mood and affect are within normal limits. Small plaque psoriasis mainly extremities less than 2% body surface area.  No synovitis.    A focused examination was performed including head, neck, arms, nails, back, joints. Relevant physical exam findings are noted in the Assessment and Plan.   Assessment & Plan    Psoriasis  Essentially all treatment options for psoriasis reviewed including newer systemic and topicals.  Emphasized importance of healthy lifestyle because of tendency for people with psoriasis to develop adult metabolic syndrome.  Okay refills clobetasol (currently the ointment is preferred on her health plan).  Encouraged to use this episodically and to avoid use on face and body folds.  Will contact me if there is a significant flareup.  Related Medications clobetasol ointment (TEMOVATE) 0.05 % APPLY  OINTMENT TOPICALLY TO AFFECTED AREA ONCE DAILY UNTIL CLEAR      I, Lavonna Monarch, MD, have reviewed all documentation for this visit.  The documentation on 01/11/21 for the exam, diagnosis, procedures, and orders are all accurate and complete.

## 2021-01-24 ENCOUNTER — Other Ambulatory Visit: Payer: Self-pay | Admitting: Family

## 2021-01-24 DIAGNOSIS — E1169 Type 2 diabetes mellitus with other specified complication: Secondary | ICD-10-CM

## 2021-01-24 DIAGNOSIS — E785 Hyperlipidemia, unspecified: Secondary | ICD-10-CM

## 2021-02-02 ENCOUNTER — Other Ambulatory Visit: Payer: Self-pay | Admitting: Dermatology

## 2021-02-02 ENCOUNTER — Other Ambulatory Visit: Payer: Self-pay | Admitting: Family

## 2021-02-02 DIAGNOSIS — L409 Psoriasis, unspecified: Secondary | ICD-10-CM

## 2021-02-02 DIAGNOSIS — E1169 Type 2 diabetes mellitus with other specified complication: Secondary | ICD-10-CM

## 2021-02-05 ENCOUNTER — Other Ambulatory Visit: Payer: Self-pay | Admitting: Family

## 2021-02-05 DIAGNOSIS — E785 Hyperlipidemia, unspecified: Secondary | ICD-10-CM

## 2021-02-05 DIAGNOSIS — E1169 Type 2 diabetes mellitus with other specified complication: Secondary | ICD-10-CM

## 2021-02-06 ENCOUNTER — Ambulatory Visit: Payer: BC Managed Care – PPO | Admitting: Family Medicine

## 2021-02-06 ENCOUNTER — Encounter: Payer: Self-pay | Admitting: Family Medicine

## 2021-02-06 VITALS — BP 140/83 | HR 75 | Wt 285.0 lb

## 2021-02-06 DIAGNOSIS — M130 Polyarthritis, unspecified: Secondary | ICD-10-CM | POA: Diagnosis not present

## 2021-02-06 MED ORDER — DICLOFENAC SODIUM 1 % EX GEL: 2.0000 g | Freq: Four times a day (QID) | CUTANEOUS | 3 refills | Status: DC

## 2021-02-06 NOTE — Progress Notes (Signed)
BP 140/83   Pulse 75   Wt 285 lb (129.3 kg)   SpO2 99%   BMI 47.43 kg/m    Subjective:   Patient ID: Maria Winters, female    DOB: 1960-04-11, 60 y.o.   MRN: [id number]  HPI: Maria Winters is a 60 y.o. female presenting on 02/06/2021 for joint pain   HPI Patient is coming in today with complaints of acute 3 days worth of joint pain.  She says its all over she is having joint pain in her wrists and her fingers and her shoulders and her knees and her feet and her hips.  She says it is come on over the past 3 days and she denies any other symptoms with that.  She denies any cough or congestion or wheezing.  She denies any abnormal rashes.  She says it just came all of a sudden and she just feels achy and is worse in the morning and then gets better throughout the day.  She has been around people that have had COVID but does not have any B other symptoms of COVID.  She also tested negative for COVID  Relevant past medical, surgical, family and social history reviewed and updated as indicated. Interim medical history since our last visit reviewed. Allergies and medications reviewed and updated.  Review of Systems  Constitutional:  Negative for chills and fever.  HENT:  Negative for congestion, ear discharge and ear pain.   Eyes:  Negative for redness and visual disturbance.  Respiratory:  Negative for cough, chest tightness, shortness of breath and wheezing.   Cardiovascular:  Negative for chest pain and leg swelling.  Musculoskeletal:  Positive for arthralgias. Negative for back pain and gait problem.  Skin:  Negative for rash.  Neurological:  Negative for light-headedness and headaches.  Psychiatric/Behavioral:  Negative for agitation and behavioral problems.   All other systems reviewed and are negative.  Per HPI unless specifically indicated above   Allergies as of 02/06/2021       Reactions   Esomeprazole Magnesium    Patient states that the Nexium caused tremors         Medication List        Accurate as of February 06, 2021  4:45 PM. If you have any questions, ask your nurse or doctor.          STOP taking these medications    Eliquis 5 MG Tabs tablet Generic drug: apixaban Stopped by: Fransisca Kaufmann Keller Mikels, MD       TAKE these medications    acetaminophen 325 MG tablet Commonly known as: TYLENOL Take 650 mg by mouth every 6 (six) hours as needed.   atorvastatin 20 MG tablet Commonly known as: LIPITOR Take 1 tablet (20 mg total) by mouth daily.   clobetasol ointment 0.05 % Commonly known as: TEMOVATE APPLY  OINTMENT TOPICALLY TO AFFECTED AREA ONCE DAILY UNTIL CLEAR   dexamethasone 0.5 MG/5ML elixir Take 0.5 mg by mouth 4 (four) times daily.   diclofenac Sodium 1 % Gel Commonly known as: Voltaren Apply 2 g topically 4 (four) times daily. Started by: Fransisca Kaufmann Karmina Zufall, MD   fluocinonide gel 0.05 % Commonly known as: LIDEX Apply topically 4 (four) times daily.   meclizine 25 MG tablet Commonly known as: ANTIVERT Take 1 tablet (25 mg total) by mouth 2 (two) times daily as needed for dizziness.   metoprolol succinate 25 MG 24 hr tablet Commonly known as: TOPROL-XL Take 1 tablet (25 mg  total) by mouth daily.         Objective:   BP 140/83   Pulse 75   Wt 285 lb (129.3 kg)   SpO2 99%   BMI 47.43 kg/m   Wt Readings from Last 3 Encounters:  02/06/21 285 lb (129.3 kg)  08/15/20 279 lb (126.6 kg)  07/17/20 274 lb 9.6 oz (124.6 kg)    Physical Exam Vitals and nursing note reviewed.  Constitutional:      General: She is not in acute distress.    Appearance: She is well-developed. She is not diaphoretic.  Eyes:     Conjunctiva/sclera: Conjunctivae normal.  Cardiovascular:     Rate and Rhythm: Normal rate and regular rhythm.     Heart sounds: Normal heart sounds. No murmur heard. Pulmonary:     Effort: Pulmonary effort is normal. No respiratory distress.     Breath sounds: Normal breath sounds. No wheezing.   Musculoskeletal:        General: No swelling or deformity. Normal range of motion.     Right shoulder: Tenderness present. No bony tenderness or crepitus. Normal range of motion. Normal strength.     Left shoulder: Tenderness present. No bony tenderness or crepitus. Normal range of motion. Normal strength.     Right elbow: Normal.     Left elbow: Normal.     Right wrist: Normal.     Left wrist: Normal.     Right hand: Normal.     Left hand: Normal.     Right knee: Normal.     Left knee: Normal.     Right ankle: Normal.     Left ankle: Normal.     Right foot: Normal.     Left foot: Normal.  Skin:    General: Skin is warm and dry.     Findings: No rash.  Neurological:     Mental Status: She is alert and oriented to person, place, and time.     Coordination: Coordination normal.  Psychiatric:        Behavior: Behavior normal.      Assessment & Plan:   Problem List Items Addressed This Visit   None Visit Diagnoses     Polyarthritis    -  Primary   Relevant Medications   diclofenac Sodium (VOLTAREN) 1 % GEL       We will do Voltaren gel and recommended Advil over-the-counter for inflammation.  Likely think it is a viral syndrome even though she does not have other symptoms and it should not last more than 7 to 10 days.  Call us back if it does Follow up plan: Return if symptoms worsen or fail to improve.  Counseling provided for all of the vaccine components No orders of the defined types were placed in this encounter.   Caryl Pina, MD [facility] 02/06/2021, 4:45 PM

## 2021-02-12 ENCOUNTER — Other Ambulatory Visit: Payer: Self-pay | Admitting: Family

## 2021-02-12 DIAGNOSIS — E785 Hyperlipidemia, unspecified: Secondary | ICD-10-CM

## 2021-02-12 DIAGNOSIS — E1169 Type 2 diabetes mellitus with other specified complication: Secondary | ICD-10-CM

## 2021-02-13 ENCOUNTER — Telehealth: Payer: Self-pay | Admitting: Family

## 2021-02-13 DIAGNOSIS — E785 Hyperlipidemia, unspecified: Secondary | ICD-10-CM

## 2021-02-13 DIAGNOSIS — E1169 Type 2 diabetes mellitus with other specified complication: Secondary | ICD-10-CM

## 2021-02-13 NOTE — Telephone Encounter (Signed)
  Prescription Request  02/13/2021  Is this a "Controlled Substance" medicine? NO  Have you seen your PCP in the last 2 weeks? NO  If YES, route message to pool  -  If NO, patient needs to be scheduled for appointment.  What is the name of the medication or equipment? Atorvastatin 20 mg  Have you contacted your pharmacy to request a refill? YES   Which pharmacy would you like this sent to? [facility] in [place]   Patient notified that their request is being sent to the clinical staff for review and that they should receive a response within 2 business days.

## 2021-02-16 ENCOUNTER — Other Ambulatory Visit: Payer: Self-pay

## 2021-02-16 ENCOUNTER — Ambulatory Visit: Payer: BC Managed Care – PPO | Admitting: Student

## 2021-02-16 ENCOUNTER — Encounter: Payer: Self-pay | Admitting: Student

## 2021-02-16 VITALS — BP 131/80 | HR 82 | Temp 98.4°F | Resp 17 | Ht 65.0 in | Wt 284.0 lb

## 2021-02-16 DIAGNOSIS — I471 Supraventricular tachycardia: Secondary | ICD-10-CM | POA: Diagnosis not present

## 2021-02-16 DIAGNOSIS — I480 Paroxysmal atrial fibrillation: Secondary | ICD-10-CM

## 2021-02-16 MED ORDER — APIXABAN 5 MG PO TABS: 5.0000 mg | ORAL_TABLET | Freq: Two times a day (BID) | ORAL | 3 refills | Status: DC

## 2021-02-16 MED ORDER — ATORVASTATIN CALCIUM 20 MG PO TABS: 20.0000 mg | ORAL_TABLET | Freq: Every day | ORAL | 0 refills | Status: DC

## 2021-02-16 NOTE — Telephone Encounter (Signed)
Pt was not aware that the prescription was printed in October and [facility] did not let her know that her refill was denied. Refill sent in for 30 days, she will check with her work schedule and call back in to make an appt within this time frame. When appt made she will ask that a message be sent to the refill pool in case she needs another refill till her appt

## 2021-02-16 NOTE — Progress Notes (Signed)
Primary Physician/Referring:  Sharion Balloon, FNP  Patient ID: Maria Winters, female    DOB: 05-09-1960, 60 y.o.   MRN: [id number]  Chief Complaint  Patient presents with   Atrial Fibrillation   Follow-up    HPI:    Maria Winters  is a 60 y.o. African-American obese female with history of diabetes, hypertension, hyperlipidemia.  Although patient has lost weight over the last several months and A1c as well as lipid and blood pressure control have improved. She quit smoking in 1994.  Patient diagnosed with paroxysmal atrial fibrillation 06/2020 on cardiac monitor while in emergency department.  Patient presents for 62-month follow-up of paroxysmal atrial fibrillation.  Patient reports episodes of palpitations occasionally associated with shortness of breath occurring 2-3 times per week primarily at rest.  These episodes last anywhere from a few minutes to half an hour.  She did not follow-up with her PCP regarding referral to sleep study as discussed at last office visit.  Denies chest pain, dizziness, syncope, near syncope.  Denies orthopnea, PND, leg edema.  Past Medical History:  Diagnosis Date   Abnormal Pap smear of cervix    in her 20's-- hx of conization/LEEP procedure-normal paps since   Abnormal uterine bleeding    Anemia    ~2005   Arthritis    Chest pain    Fibroid    GERD (gastroesophageal reflux disease)    Helicobacter pylori (H. pylori)    Psoriasis    Reflux    STD (sexually transmitted disease) 1994   Tx'd for Chlamydia   Vision abnormalities    tunnel vision   Past Surgical History:  Procedure Laterality Date   cervix dilated     CHOLECYSTECTOMY N/A 09/13/2012   Procedure: LAPAROSCOPIC CHOLECYSTECTOMY WITH INTRAOPERATIVE CHOLANGIOGRAM;  Surgeon: Pedro Earls, MD;  Location: [facility];  Service: General;  Laterality: N/A;   DILATION AND CURETTAGE OF UTERUS     DILATION AND CURETTAGE, DIAGNOSTIC / [place]   with Conization    ESOPHAGOGASTRODUODENOSCOPY N/A 05/11/2012   Procedure: ESOPHAGOGASTRODUODENOSCOPY (EGD);  Surgeon: Rogene Houston, MD;  Location: [facility];  Service: Endoscopy;  Laterality: N/A;  125   LEEP     Family History  Problem Relation Age of Onset   Diabetes Mother    Hypertension Father    Healthy Sister    Hypertension Sister    Hypertension Brother    Gout Brother    Heart attack Brother    Breast cancer Sister 17       lumpectomy   Sickle cell trait Brother    Diabetes Maternal Grandmother        with 2 BKA   Stroke Maternal Grandmother    Breast cancer Maternal Aunt     Social History   Tobacco Use   Smoking status: Former    Packs/day: 0.25    Years: 10.00    Pack years: 2.50    Types: Cigarettes    Quit date: 06/20/1992    Years since quitting: 28.6   Smokeless tobacco: Never   Tobacco comments:    Quit in 1995  Substance Use Topics   Alcohol use: Not Currently    Alcohol/week: 1.0 standard drink    Types: 1 Glasses of wine per week    Comment: Patient may drink 2 glasses of wine a year   Marital Status: Single   ROS  Review of Systems  Constitutional: Negative for malaise/fatigue and weight gain.  Cardiovascular:  Positive for palpitations. Negative for chest pain, claudication, leg swelling, near-syncope, orthopnea, paroxysmal nocturnal dyspnea and syncope.  Respiratory:  Positive for shortness of breath.   Hematologic/Lymphatic: Does not bruise/bleed easily.  Gastrointestinal:  Negative for melena.  Neurological:  Negative for dizziness and weakness.   Objective  Blood pressure 131/80, pulse 82, temperature 98.4 F (36.9 C), temperature source Temporal, resp. rate 17, height 5\' 5"  (1.651 m), weight 284 lb (128.8 kg), SpO2 96 %.  Vitals with BMI 02/16/2021 02/06/2021 08/15/2020  Height 5\' 5"  - 5\' 5"   Weight 284 lbs 285 lbs 279 lbs  BMI 38.45 - 36.46  Systolic 803 212 248  Diastolic 80 83 72  Pulse 82 75 83      Physical Exam Vitals reviewed.   Constitutional:      Appearance: She is obese.  Cardiovascular:     Rate and Rhythm: Normal rate and regular rhythm.     Pulses: Intact distal pulses.     Heart sounds: S1 normal and S2 normal. No murmur heard.   No gallop.  Pulmonary:     Effort: Pulmonary effort is normal. No respiratory distress.     Breath sounds: No wheezing, rhonchi or rales.  Musculoskeletal:     Right lower leg: No edema.     Left lower leg: No edema.  Skin:    General: Skin is warm and dry.  Neurological:     Mental Status: She is alert.    Laboratory examination:   Recent Labs    07/02/20 1442 07/17/20 1455  NA 138 139  K 3.7 4.3  CL 104 101  CO2 26 25  GLUCOSE 101* 185*  BUN 15 10  CREATININE 0.76 0.86  CALCIUM 8.7* 9.4  GFRNONAA >60  --    CrCl cannot be calculated (Patient's most recent lab result is older than the maximum 21 days allowed.).  CMP Latest Ref Rng & Units 07/17/2020 07/02/2020 08/24/2019  Glucose 65 - 99 mg/dL 185(H) 101(H) 126(H)  BUN 8 - 27 mg/dL 10 15 7(L)  Creatinine 0.57 - 1.00 mg/dL 0.86 0.76 0.88  Sodium 134 - 144 mmol/L 139 138 141  Potassium 3.5 - 5.2 mmol/L 4.3 3.7 4.2  Chloride 96 - 106 mmol/L 101 104 105  CO2 20 - 29 mmol/L 25 26 22   Calcium 8.7 - 10.3 mg/dL 9.4 8.7(L) 8.6(L)  Total Protein 6.0 - 8.5 g/dL 7.3 - 7.1  Total Bilirubin 0.0 - 1.2 mg/dL 0.3 - 0.3  Alkaline Phos 44 - 121 IU/L 97 - 97  AST 0 - 40 IU/L 15 - 15  ALT 0 - 32 IU/L 24 - 27   CBC Latest Ref Rng & Units 07/17/2020 07/02/2020 08/24/2019  WBC 3.4 - 10.8 x10E3/uL 7.9 5.8 5.2  Hemoglobin 11.1 - 15.9 g/dL 12.7 12.0 12.6  Hematocrit 34.0 - 46.6 % 39.5 38.9 37.9  Platelets 150 - 450 x10E3/uL 339 332 323    Lipid Panel Recent Labs    08/08/20 0959  CHOL 149  TRIG 52  LDLCALC 64  HDL 74    HEMOGLOBIN A1C Lab Results  Component Value Date   HGBA1C 6.9 07/17/2020   TSH Recent Labs    07/02/20 1442  TSH 0.711    External labs:  None  Allergies   Allergies  Allergen Reactions    Esomeprazole Magnesium     Patient states that the Nexium caused tremors    Medications Prior to Visit:   Outpatient Medications Prior to Visit  Medication Sig  Dispense Refill   acetaminophen (TYLENOL) 325 MG tablet Take 650 mg by mouth every 6 (six) hours as needed.     atorvastatin (LIPITOR) 20 MG tablet Take 1 tablet (20 mg total) by mouth daily. 90 tablet 0   clobetasol ointment (TEMOVATE) 0.05 % APPLY  OINTMENT TOPICALLY TO AFFECTED AREA ONCE DAILY UNTIL CLEAR 15 g 0   dexamethasone 0.5 MG/5ML elixir Take 0.5 mg by mouth 4 (four) times daily.     diclofenac Sodium (VOLTAREN) 1 % GEL Apply 2 g topically 4 (four) times daily. 350 g 3   fluocinonide gel (LIDEX) 0.05 % Apply topically 4 (four) times daily.     meclizine (ANTIVERT) 25 MG tablet Take 1 tablet (25 mg total) by mouth 2 (two) times daily as needed for dizziness. 90 tablet 0   metoprolol succinate (TOPROL-XL) 25 MG 24 hr tablet Take 1 tablet (25 mg total) by mouth daily. 90 tablet 0   No facility-administered medications prior to visit.   Final Medications at End of Visit    Current Meds  Medication Sig   acetaminophen (TYLENOL) 325 MG tablet Take 650 mg by mouth every 6 (six) hours as needed.   apixaban (ELIQUIS) 5 MG TABS tablet Take 1 tablet (5 mg total) by mouth 2 (two) times daily.   atorvastatin (LIPITOR) 20 MG tablet Take 1 tablet (20 mg total) by mouth daily.   clobetasol ointment (TEMOVATE) 0.05 % APPLY  OINTMENT TOPICALLY TO AFFECTED AREA ONCE DAILY UNTIL CLEAR   dexamethasone 0.5 MG/5ML elixir Take 0.5 mg by mouth 4 (four) times daily.   diclofenac Sodium (VOLTAREN) 1 % GEL Apply 2 g topically 4 (four) times daily.   fluocinonide gel (LIDEX) 0.05 % Apply topically 4 (four) times daily.   meclizine (ANTIVERT) 25 MG tablet Take 1 tablet (25 mg total) by mouth 2 (two) times daily as needed for dizziness.   metoprolol succinate (TOPROL-XL) 25 MG 24 hr tablet Take 1 tablet (25 mg total) by mouth daily.    Radiology:   [facility] Chest Portable 1 View 07/02/2020 EKG leads project over the chest. Trachea midline. Cardiomediastinal contours are top normal to mildly enlarged, accentuated by portable technique. Mild central pulmonary vascular engorgement may be present. No consolidation. No sign of pleural effusion. Subtle opacities are seen in the RIGHT and LEFT mid chest. Also at the LEFT lung base along the LEFT heart border. On limited assessment no acute skeletal process.   IMPRESSION: Mild cardiac enlargement with signs of central pulmonary vascular engorgement. Subtle increased interstitial markings and vague opacities could also reflect asymmetric edema or viral infection. Consider PA and lateral chest radiograph to ensure resolution on follow-up.   Cardiac Studies:   [facility] MYOCARDIAL PERFUSION WO LEXISCAN 08/06/2020 Normal ECG stress. The patient exercised for 3 minutes and 2 seconds of a Bruce protocol, achieving approximately 4.66 METs.  Markedly reduced exercise tolerance.  Symptoms of dyspnea.  Normal blood pressure response. Mild breast attenuation noted in the inferior wall in RAW images. Myocardial perfusion is normal. Overall LV systolic function is normal without regional wall motion abnormalities. Stress LV EF: 66%. No previous exam available for comparison. Low risk study.  Ambulatory cardiac telemetry 07/04/2020 - 07/18/2020: Predominant rhythm was sinus with maximum heart rate 40 bpm, maximum heart rate 179 bpm, average heart rate of 70 bpm.  Patient's symptoms correlated with normal sinus rhythm, PACs, and PVCs.  Patient on 48 episodes of supraventricular tachycardia with longest lasting 14.6 seconds and maximum heart rate 179  bpm.  Some episodes of SVT may be possible atrial tachycardia.  Rare PACs and PVCs.  Ventricular bigeminy noted.  No atrial fibrillation or flutter, high degree AV block, or ventricular tachycardia.  [facility] ECHOCARDIOGRAM COMPLETE 94/76/5465 Normal LV systolic function  with EF 66%. Left ventricle cavity is normal in size. Normal left ventricular wall thickness. Normal global wall motion. Doppler evidence of grade I (impaired) diastolic dysfunction, normal LAP. Calculated EF 66%. Trileaflet aortic valve.  Trace aortic regurgitation.  EKG:  02/16/2021: Sinus rhythm at a rate of 77 bpm.  Normal axis.  No evidence of ischemia or underlying injury pattern.  Compared to EKG 07/03/2020, no significant change.  Assessment     ICD-10-CM   1. Paroxysmal atrial fibrillation (HCC)  I48.0 EKG 12-Lead    Ambulatory referral to Sleep Studies    2. Supraventricular tachycardia (HCC)  I47.1         There are no discontinued medications.    Meds ordered this encounter  Medications   apixaban (ELIQUIS) 5 MG TABS tablet    Sig: Take 1 tablet (5 mg total) by mouth 2 (two) times daily.    Dispense:  60 tablet    Refill:  3   This patients CHA2DS2-VASc Score 2 (F, DM) and yearly risk of stroke 2.2%.   Recommendations:   Maria Winters is a 60 y.o.  African-American obese female with history of diabetes, hypertension, hyperlipidemia.  Although patient has lost weight over the last several months and A1c as well as lipid and blood pressure control have improved. She quit smoking in 1994.  Patient diagnosed with paroxysmal atrial fibrillation 06/2020 on cardiac monitor while in emergency department.  Patient presents for 3-month follow-up of paroxysmal atrial fibrillation.  Patient does have episodes of palpitations and shortness of breath 2-3 times per week, although cannot be sure whether these are episodes of SVT or atrial fib.  She is otherwise doing well.  Discussed at length with patient again regarding risks versus benefits of oral anticoagulation given CHA2DS2-VASc score of 2 and age of 48.  Patient had several questions all of which were addressed to her satisfaction.  Shared decision was to resume Eliquis at this time.  Patient is aware of signs or symptoms of  which to notify our office immediately.  Patient's blood pressure is well controlled, EKG today is unchanged compared to previous, and lipids are under excellent control.  At this time patient is agreeable to a referral to sleep specialist for evaluation of underlying sleep apnea, referral has been sent.  Follow-up in 6 months, sooner if needed, for paroxysmal atrial fibrillation.   Alethia Berthold, PA-C 02/16/2021, 10:31 AM Office: [phone]

## 2021-02-23 ENCOUNTER — Other Ambulatory Visit: Payer: Self-pay | Admitting: Family

## 2021-02-23 DIAGNOSIS — E785 Hyperlipidemia, unspecified: Secondary | ICD-10-CM

## 2021-02-24 ENCOUNTER — Telehealth: Payer: Self-pay | Admitting: Family

## 2021-02-24 NOTE — Telephone Encounter (Signed)
Patient aware and verbalized understanding. °

## 2021-02-24 NOTE — Telephone Encounter (Signed)
Appt made

## 2021-02-27 ENCOUNTER — Ambulatory Visit (INDEPENDENT_AMBULATORY_CARE_PROVIDER_SITE_OTHER): Payer: BC Managed Care – PPO | Admitting: Family

## 2021-02-27 ENCOUNTER — Encounter: Payer: Self-pay | Admitting: Family

## 2021-02-27 DIAGNOSIS — K219 Gastro-esophageal reflux disease without esophagitis: Secondary | ICD-10-CM

## 2021-02-27 DIAGNOSIS — F411 Generalized anxiety disorder: Secondary | ICD-10-CM | POA: Diagnosis not present

## 2021-02-27 DIAGNOSIS — E1169 Type 2 diabetes mellitus with other specified complication: Secondary | ICD-10-CM

## 2021-02-27 DIAGNOSIS — K5900 Constipation, unspecified: Secondary | ICD-10-CM | POA: Diagnosis not present

## 2021-02-27 DIAGNOSIS — E785 Hyperlipidemia, unspecified: Secondary | ICD-10-CM

## 2021-02-27 DIAGNOSIS — Z1211 Encounter for screening for malignant neoplasm of colon: Secondary | ICD-10-CM

## 2021-02-27 MED ORDER — ATORVASTATIN CALCIUM 20 MG PO TABS: 20.0000 mg | ORAL_TABLET | Freq: Every day | ORAL | 1 refills | Status: DC

## 2021-02-27 NOTE — Progress Notes (Addendum)
Virtual Visit  Note Due to COVID-19 pandemic this visit was conducted virtually. This visit type was conducted due to national recommendations for restrictions regarding the COVID-19 Pandemic (e.g. social distancing, sheltering in place) in an effort to limit this patient's exposure and mitigate transmission in our community. All issues noted in this document were discussed and addressed.  A physical exam was not performed with this format.  I connected with Maria Winters on 02/27/21 at 11:05 AM  by telephone and verified that I am speaking with the correct person using two identifiers. Maria Winters is currently located at work and no one is currently with her during visit. The provider, Evelina Dun, FNP is located in their office at time of visit.  I discussed the limitations, risks, security and privacy concerns of performing an evaluation and management service by telephone and the availability of in person appointments. I also discussed with the patient that there may be a patient responsible charge related to this service. The patient expressed understanding and agreed to proceed.  [name], Maria Winters are scheduled for a virtual visit with your provider today.    Just as we do with appointments in the office, we must obtain your consent to participate.  Your consent will be active for this visit and any virtual visit you may have with one of our providers in the next 365 days.    If you have a MyChart account, I can also send a copy of this consent to you electronically.  All virtual visits are billed to your insurance company just like a traditional visit in the office.  As this is a virtual visit, video technology does not allow for your provider to perform a traditional examination.  This may limit your provider's ability to fully assess your condition.  If your provider identifies any concerns that need to be evaluated in person or the need to arrange testing such as labs, EKG, etc, we  will make arrangements to do so.    Although advances in technology are sophisticated, we cannot ensure that it will always work on either your end or our end.  If the connection with a video visit is poor, we may have to switch to a telephone visit.  With either a video or telephone visit, we are not always able to ensure that we have a secure connection.   I need to obtain your verbal consent now.   Are you willing to proceed with your visit today?   Maria Winters has provided verbal consent on 02/27/2021 for a virtual visit (video or telephone).   Evelina Dun, [place] 02/27/2021  11:07 AM    History and Present Illness:  Pt calls the office today for  chronic follow up. She was recently diagnosed with A Fib on 07/02/20.  She is following a Cardiologists. She is taking Eliquis BID.  Diabetes She presents for her follow-up diabetic visit. She has type 2 diabetes mellitus. Her disease course has been stable. Hypoglycemia symptoms include nervousness/anxiousness. Pertinent negatives for diabetes include no blurred vision and no foot paresthesias. Symptoms are stable. Diabetic complications include heart disease. Risk factors for coronary artery disease include dyslipidemia, diabetes mellitus, hypertension, sedentary lifestyle and post-menopausal. She is following a generally healthy diet. Her overall blood glucose range is 130-140 mg/dl. Eye exam is current.  Gastroesophageal Reflux She complains of belching and heartburn. This is a chronic problem. The current episode started more than 1 year ago. The problem occurs occasionally. The problem  has been waxing and waning. The symptoms are aggravated by certain foods. Risk factors include obesity. She has tried a diet change and head elevation for the symptoms. The treatment provided moderate relief.  Constipation This is a chronic problem. The current episode started more than 1 year ago. The problem has been resolved since onset. Risk factors  include obesity. She has tried laxatives for the symptoms. The treatment provided moderate relief.  Anxiety Presents for follow-up visit. Symptoms include depressed mood, excessive worry, irritability, nervous/anxious behavior and restlessness. Symptoms occur occasionally. The severity of symptoms is moderate.       Review of Systems  Constitutional:  Positive for irritability.  Eyes:  Negative for blurred vision.  Gastrointestinal:  Positive for constipation and heartburn.  Psychiatric/Behavioral:  The patient is nervous/anxious.   All other systems reviewed and are negative.   Observations/Objective: No SOB or distress noted   Assessment and Plan: 1. Type 2 diabetes mellitus with other specified complication, without long-term current use of insulin (HCC) - Bayer DCA Hb A1c Waived; Future - CBC with Differential/Platelet; Future - CMP14+EGFR; Future  2. Gastroesophageal reflux disease without esophagitis - CBC with Differential/Platelet; Future - CMP14+EGFR; Future  3. Anxiety state - CBC with Differential/Platelet; Future - CMP14+EGFR; Future  4. Constipation, unspecified constipation type - CBC with Differential/Platelet; Future - CMP14+EGFR; Future  5. Morbid obesity ([place]) - CBC with Differential/Platelet; Future - CMP14+EGFR; Future  6. Colon cancer screening - Ambulatory referral to Gastroenterology - CBC with Differential/Platelet; Future - CMP14+EGFR; Future  7. Hyperlipidemia associated with type 2 diabetes mellitus (HCC) - CBC with Differential/Platelet; Future - CMP14+EGFR; Future - Lipid panel; Future - atorvastatin (LIPITOR) 20 MG tablet; Take 1 tablet (20 mg total) by mouth daily.  Dispense: 90 tablet; Refill: 1  Labs pending Continue medications  Will get copy eye exam  RTO in 3 months   I discussed the assessment and treatment plan with the patient. The patient was provided an opportunity to ask questions and all were answered. The patient  agreed with the plan and demonstrated an understanding of the instructions.   The patient was advised to call back or seek an in-person evaluation if the symptoms worsen or if the condition fails to improve as anticipated.  The above assessment and management plan was discussed with the patient. The patient verbalized understanding of and has agreed to the management plan. Patient is aware to call the clinic if symptoms persist or worsen. Patient is aware when to return to the clinic for a follow-up visit. Patient educated on when it is appropriate to go to the emergency department.   Time call ended:  11:27 AM   I provided 22 minutes of  non face-to-face time during this encounter.    Evelina Dun, FNP

## 2021-03-03 ENCOUNTER — Other Ambulatory Visit: Payer: Self-pay | Admitting: Dermatology

## 2021-03-03 ENCOUNTER — Other Ambulatory Visit: Payer: Self-pay | Admitting: *Deleted

## 2021-03-03 DIAGNOSIS — L409 Psoriasis, unspecified: Secondary | ICD-10-CM

## 2021-03-03 MED ORDER — CLOBETASOL PROPIONATE 0.05 % EX OINT: TOPICAL_OINTMENT | CUTANEOUS | 0 refills | Status: DC

## 2021-03-10 ENCOUNTER — Other Ambulatory Visit: Payer: Self-pay | Admitting: Family

## 2021-03-10 DIAGNOSIS — E785 Hyperlipidemia, unspecified: Secondary | ICD-10-CM

## 2021-03-10 DIAGNOSIS — E1169 Type 2 diabetes mellitus with other specified complication: Secondary | ICD-10-CM

## 2021-03-11 NOTE — Addendum Note (Signed)
Addended by: Antonietta Barcelona D on: 03/11/2021 10:44 AM   Modules accepted: Orders

## 2021-03-20 ENCOUNTER — Ambulatory Visit: Payer: BC Managed Care – PPO | Admitting: Nurse Practitioner

## 2021-03-20 ENCOUNTER — Other Ambulatory Visit: Payer: Self-pay

## 2021-03-20 ENCOUNTER — Encounter: Payer: Self-pay | Admitting: Nurse Practitioner

## 2021-03-20 VITALS — BP 115/72 | HR 68 | Temp 97.7°F | Resp 20 | Ht 65.0 in | Wt 281.0 lb

## 2021-03-20 DIAGNOSIS — Z23 Encounter for immunization: Secondary | ICD-10-CM | POA: Diagnosis not present

## 2021-03-20 DIAGNOSIS — M791 Myalgia, unspecified site: Secondary | ICD-10-CM

## 2021-03-20 NOTE — Assessment & Plan Note (Signed)
Patient is reporting ongoing generalized body ache, symptoms present for the past 1-2 months.  Nothing seems to help with symptoms.  Patient requested labs because she was told this may be viral.  CBC, CMP, and TSH completed results pending. Patient is currently taking atorvastatin 20 mg tablet by mouth daily.  Advised patient to hold medication for about a week or 2 because myalgias can sometimes be induced by statin.  Patient verbalized understanding.  Advised to follow-up with worsening unresolved symptoms.  Advised to pick up diclofenac topical cream prescribed on her last visit.  And use as prescribed.

## 2021-03-20 NOTE — Progress Notes (Signed)
Acute Office Visit  Subjective:    Patient ID: Maria Winters, female    DOB: 1960-06-21, 61 y.o.   MRN: [id number]  Chief Complaint  Patient presents with   Joint Pain    HPI Patient is in today for Pain  She reports new onset myalgia pain. was not an injury that may have caused the pain. The pain started about a month ago and is staying constant. The pain does radiate generalized body pain and joints. The pain is described as soreness, is 5/10 in intensity, occurring constantly. Symptoms are worse in the: All day Aggravating factors: none Relieving factors: none.  She has tried acetaminophen with no relief.    Past Medical History:  Diagnosis Date   Abnormal Pap smear of cervix    in her 20's-- hx of conization/LEEP procedure-normal paps since   Abnormal uterine bleeding    Anemia    ~2005   Arthritis    Chest pain    Fibroid    GERD (gastroesophageal reflux disease)    Helicobacter pylori (H. pylori)    Psoriasis    Reflux    STD (sexually transmitted disease) 1994   Tx'd for Chlamydia   Vision abnormalities    tunnel vision    Past Surgical History:  Procedure Laterality Date   cervix dilated     CHOLECYSTECTOMY N/A 09/13/2012   Procedure: LAPAROSCOPIC CHOLECYSTECTOMY WITH INTRAOPERATIVE CHOLANGIOGRAM;  Surgeon: Pedro Earls, MD;  Location: [facility];  Service: General;  Laterality: N/A;   DILATION AND CURETTAGE OF UTERUS     DILATION AND CURETTAGE, DIAGNOSTIC / [place]   with Conization   ESOPHAGOGASTRODUODENOSCOPY N/A 05/11/2012   Procedure: ESOPHAGOGASTRODUODENOSCOPY (EGD);  Surgeon: Rogene Houston, MD;  Location: [facility];  Service: Endoscopy;  Laterality: N/A;  89   LEEP      Family History  Problem Relation Age of Onset   Diabetes Mother    Hypertension Father    Healthy Sister    Hypertension Sister    Hypertension Brother    Gout Brother    Heart attack Brother    Breast cancer Sister 42       lumpectomy   Sickle cell  trait Brother    Diabetes Maternal Grandmother        with 2 BKA   Stroke Maternal Grandmother    Breast cancer Maternal Aunt     Social History   Socioeconomic History   Marital status: Single    Spouse name: Not on file   Number of children: Not on file   Years of education: Not on file   Highest education level: Not on file  Occupational History   Not on file  Tobacco Use   Smoking status: Former    Packs/day: 0.25    Years: 10.00    Pack years: 2.50    Types: Cigarettes    Quit date: 06/20/1992    Years since quitting: 28.7   Smokeless tobacco: Never   Tobacco comments:    Quit in 1995  Vaping Use   Vaping Use: Never used  Substance and Sexual Activity   Alcohol use: Not Currently    Alcohol/week: 1.0 standard drink    Types: 1 Glasses of wine per week    Comment: Patient may drink 2 glasses of wine a year   Drug use: No   Sexual activity: Never    Birth control/protection: Abstinence  Other Topics Concern   Not on file  Social  History Narrative   Not on file   Social Determinants of Health   Financial Resource Strain: Not on file  Food Insecurity: Not on file  Transportation Needs: Not on file  Physical Activity: Not on file  Stress: Not on file  Social Connections: Not on file  Intimate Partner Violence: Not on file    Outpatient Medications Prior to Visit  Medication Sig Dispense Refill   acetaminophen (TYLENOL) 325 MG tablet Take 650 mg by mouth every 6 (six) hours as needed.     apixaban (ELIQUIS) 5 MG TABS tablet Take 1 tablet (5 mg total) by mouth 2 (two) times daily. 60 tablet 3   atorvastatin (LIPITOR) 20 MG tablet Take 1 tablet (20 mg total) by mouth daily. 90 tablet 1   clobetasol ointment (TEMOVATE) 0.05 % APPLY  OINTMENT TOPICALLY TO AFFECTED AREA ONCE DAILY UNTIL CLEAR 15 g 0   dexamethasone 0.5 MG/5ML elixir Take 0.5 mg by mouth 4 (four) times daily.     fluocinonide gel (LIDEX) 0.05 % Apply topically 4 (four) times daily.     metoprolol  succinate (TOPROL-XL) 25 MG 24 hr tablet Take 1 tablet (25 mg total) by mouth daily. 90 tablet 0   diclofenac Sodium (VOLTAREN) 1 % GEL Apply 2 g topically 4 (four) times daily. (Patient not taking: Reported on 03/20/2021) 350 g 3   clobetasol ointment (TEMOVATE) 0.05 % Apply to area as directed. 15 g 0   No facility-administered medications prior to visit.    Allergies  Allergen Reactions   Esomeprazole Magnesium     Patient states that the Nexium caused tremors    Review of Systems  Constitutional: Negative.   HENT: Negative.    Eyes: Negative.   Respiratory: Negative.    Cardiovascular: Negative.   Gastrointestinal: Negative.   Musculoskeletal:  Positive for myalgias.  Skin: Negative.  Negative for rash.  All other systems reviewed and are negative.     Objective:    Physical Exam Vitals and nursing note reviewed.  Constitutional:      Appearance: Normal appearance. She is obese.  HENT:     Mouth/Throat:     Mouth: Mucous membranes are moist.     Pharynx: Oropharynx is clear.  Eyes:     Conjunctiva/sclera: Conjunctivae normal.  Cardiovascular:     Pulses: Normal pulses.     Heart sounds: Normal heart sounds.  Pulmonary:     Effort: Pulmonary effort is normal.     Breath sounds: Normal breath sounds.  Abdominal:     General: Bowel sounds are normal.  Musculoskeletal:        General: Tenderness present.  Skin:    General: Skin is warm.     Findings: No rash.  Neurological:     Mental Status: She is alert and oriented to person, place, and time.  Psychiatric:        Behavior: Behavior normal.    BP 115/72    Pulse 68    Temp 97.7 F (36.5 C)    Resp 20    Ht 5' 5"  (1.651 m)    Wt 281 lb (127.5 kg)    LMP 03/06/2021 (Exact Date)    SpO2 97%    BMI 46.76 kg/m  Wt Readings from Last 3 Encounters:  03/20/21 281 lb (127.5 kg)  02/16/21 284 lb (128.8 kg)  02/06/21 285 lb (129.3 kg)    Health Maintenance Due  Topic Date Due   Zoster Vaccines- Shingrix (1 of  2)  Never done   OPHTHALMOLOGY EXAM  03/05/2017   COVID-19 Vaccine (4 - Booster for Pfizer series) 03/22/2020   COLONOSCOPY (Pts 45-69yr Insurance coverage will need to be confirmed)  11/30/2020   FOOT EXAM  12/06/2020   HEMOGLOBIN A1C  01/17/2021   MAMMOGRAM  01/20/2021    There are no preventive care reminders to display for this patient.   Lab Results  Component Value Date   TSH 0.711 07/02/2020   Lab Results  Component Value Date   WBC 7.9 07/17/2020   HGB 12.7 07/17/2020   HCT 39.5 07/17/2020   MCV 82 07/17/2020   PLT 339 07/17/2020   Lab Results  Component Value Date   NA 139 07/17/2020   K 4.3 07/17/2020   CO2 25 07/17/2020   GLUCOSE 185 (H) 07/17/2020   BUN 10 07/17/2020   CREATININE 0.86 07/17/2020   BILITOT 0.3 07/17/2020   ALKPHOS 97 07/17/2020   AST 15 07/17/2020   ALT 24 07/17/2020   PROT 7.3 07/17/2020   ALBUMIN 3.8 07/17/2020   CALCIUM 9.4 07/17/2020   ANIONGAP 8 07/02/2020   EGFR 77 07/17/2020   Lab Results  Component Value Date   CHOL 149 08/08/2020   Lab Results  Component Value Date   HDL 74 08/08/2020   Lab Results  Component Value Date   LDLCALC 64 08/08/2020   Lab Results  Component Value Date   TRIG 52 08/08/2020   Lab Results  Component Value Date   CHOLHDL 2.8 08/24/2019   Lab Results  Component Value Date   HGBA1C 6.9 07/17/2020       Assessment & Plan:   Problem List Items Addressed This Visit       Other   Myalgia - Primary (Chronic)    Patient is reporting ongoing generalized body ache, symptoms present for the past 1-2 months.  Nothing seems to help with symptoms.  Patient requested labs because she was told this may be viral.  CBC, CMP, and TSH completed results pending. Patient is currently taking atorvastatin 20 mg tablet by mouth daily.  Advised patient to hold medication for about a week or 2 because myalgias can sometimes be induced by statin.  Patient verbalized understanding.  Advised to follow-up with  worsening unresolved symptoms.  Advised to pick up diclofenac topical cream prescribed on her last visit.  And use as prescribed.        Relevant Orders   CBC with Differential   Comprehensive metabolic panel   TSH   Other Visit Diagnoses     Need for immunization against influenza       Relevant Orders   Flu Vaccine QUAD 66moM (Fluarix, Fluzone & Alfiuria Quad PF) (Completed)        No orders of the defined types were placed in this encounter.    OnIvy LynnNP

## 2021-03-20 NOTE — Patient Instructions (Signed)
Muscle Pain, Adult ?Muscle pain, also called myalgia, is a condition in which a person has pain in one or more muscles in the body. Muscle pain may be mild, moderate, or severe. It may feel sharp, achy, or burning. In most cases, the pain lasts only a short time and goes away without treatment. ?Muscle pain can result from using muscles in a new or different way or after a period of inactivity. It is normal to feel some muscle pain after starting an exercise program. Muscles that have not been used often will be sore at first. ?What are the causes? ?This condition is caused by using muscles in a new or different way after a period of inactivity. Other causes may include: ?Overuse or muscle strain, especially if you are not in shape. This is the most common cause of muscle pain. ?Injury or bruising. ?Infectious diseases, including diseases caused by viruses, such as the flu (influenza). ?Fibromyalgia.This is a long-term, or chronic, condition that causes muscle tenderness, tiredness (fatigue), and headache. ?Autoimmune or rheumatologic diseases. These are conditions, such as lupus, in which the body's defense system (immunesystem) attacks areas in the body. ?Certain medicines, including ACE inhibitors and statins. ?What are the signs or symptoms? ?The main symptom of this condition is sore or painful muscles, including during activity and when stretching. You may also have slight swelling. ?How is this diagnosed? ?This condition is diagnosed with a physical exam. Your health care provider will ask questions about your pain and when it began. If you have not had muscle pain for very long, your health care provider may want to wait before doing much testing. If your muscle pain has lasted a long time, tests may be done right away. In some cases, this may include tests to rule out certain conditions or illnesses. ?How is this treated? ?Treatment for this condition depends on the cause. Home care is often enough to  relieve muscle pain. Your health care provider may also prescribe NSAIDs, such as ibuprofen. ?Follow these instructions at home: ?Medicines ?Take over-the-counter and prescription medicines only as told by your health care provider. ?Ask your health care provider if the medicine prescribed to you requires you to avoid driving or using machinery. ?Managing pain, swelling, and discomfort ?  ?If directed, put ice on the painful area. To do this: ?Put ice in a plastic bag. ?Place a towel between your skin and the bag. ?Leave the ice on for 20 minutes, 2-3 times a day. ?For the first 2 days of muscle soreness, or if there is swelling: ?Do not soak in hot baths. ?Do not use a hot tub, steam room, sauna, heating pad, or other heat source. ?After 48-72 hours, you may alternate between applying ice and applying heat as told by your health care provider. If directed, apply heat to the affected area as often as told by your health care provider. Use the heat source that your health care provider recommends, such as a moist heat pack or a heating pad. ?Place a towel between your skin and the heat source. ?Leave the heat on for 20-30 minutes. ?Remove the heat if your skin turns bright red. This is especially important if you are unable to feel pain, heat, or cold. You may have a greater risk of getting burned. ?If you have an injury, raise (elevate) the injured area above the level of your heart while you are sitting or lying down. ?Activity ? ?If overuse is causing your muscle pain: ?Slow down your activities   until the pain goes away. ?Do regular, gentle exercises if you are not usually active. ?Warm up before exercising. Stretch before and after exercising. This can help lower the risk of muscle pain. ?Do not continue working out if the pain is severe. Severe pain could mean that you have injured a muscle. ?Do not lift anything that is heavier than 5-10 lb (2.3-4.5 kg), or the limit that you are told, until your health care  provider says that it is safe. ?Return to your normal activities as told by your health care provider. Ask your health care provider what activities are safe for you. ?General instructions ?Do not use any products that contain nicotine or tobacco, such as cigarettes, e-cigarettes, and chewing tobacco. These can delay healing. If you need help quitting, ask your health care provider. ?Keep all follow-up visits as told by your health care provider. This is important. ?Contact a health care provider if you have: ?Muscle pain that gets worse and medicines do not help. ?Muscle pain that lasts longer than 3 days. ?A rash or fever along with muscle pain. ?Muscle pain after a tick bite. ?Muscle pain while working out, even though you are in good physical condition. ?Redness, soreness, or swelling along with muscle pain. ?Muscle pain after starting a new medicine or changing the dose of a medicine. ?Get help right away if you have: ?Trouble breathing. ?Trouble swallowing. ?Muscle pain along with a stiff neck, fever, and vomiting. ?Severe muscle weakness or you cannot move part of your body. ?These symptoms may represent a serious problem that is an emergency. Do not wait to see if the symptoms will go away. Get medical help right away. Call your local emergency services (911 in the [place] Do not drive yourself to the hospital. ?Summary ?Muscle pain usually lasts only a short time and goes away without treatment. ?This condition is caused by using muscles in a new or different way after a period of inactivity. ?If your muscle pain lasts longer than 3 days, tell your health care provider. ?This information is not intended to replace advice given to you by your health care provider. Make sure you discuss any questions you have with your health care provider. ?Document Revised: 09/12/2020 Document Reviewed: 09/12/2020 ?Elsevier Patient Education ? 2022 [employer] ? ?

## 2021-03-21 LAB — TSH: TSH: 1.39 u[IU]/mL (ref 0.450–4.500)

## 2021-03-21 LAB — COMPREHENSIVE METABOLIC PANEL
ALT: 18 IU/L (ref 0–32)
AST: 13 IU/L (ref 0–40)
Albumin/Globulin Ratio: 1.1 — ABNORMAL LOW (ref 1.2–2.2)
Albumin: 3.4 g/dL — ABNORMAL LOW (ref 3.8–4.8)
Alkaline Phosphatase: 115 IU/L (ref 44–121)
BUN/Creatinine Ratio: 10 — ABNORMAL LOW (ref 12–28)
BUN: 8 mg/dL (ref 8–27)
Bilirubin Total: 0.4 mg/dL (ref 0.0–1.2)
CO2: 26 mmol/L (ref 20–29)
Calcium: 9 mg/dL (ref 8.7–10.3)
Chloride: 105 mmol/L (ref 96–106)
Creatinine, Ser: 0.79 mg/dL (ref 0.57–1.00)
Globulin, Total: 3.1 g/dL (ref 1.5–4.5)
Glucose: 128 mg/dL — ABNORMAL HIGH (ref 70–99)
Potassium: 4.5 mmol/L (ref 3.5–5.2)
Sodium: 144 mmol/L (ref 134–144)
Total Protein: 6.5 g/dL (ref 6.0–8.5)
eGFR: 85 mL/min/{1.73_m2} (ref >59)

## 2021-03-21 LAB — CBC WITH DIFFERENTIAL/PLATELET
Basophils Absolute: 0.1 10*3/uL (ref 0.0–0.2)
Basos: 1 %
EOS (ABSOLUTE): 0.2 10*3/uL (ref 0.0–0.4)
Eos: 4 %
Hematocrit: 37.8 % (ref 34.0–46.6)
Hemoglobin: 12.3 g/dL (ref 11.1–15.9)
Immature Grans (Abs): 0 10*3/uL (ref 0.0–0.1)
Immature Granulocytes: 0 %
Lymphocytes Absolute: 1.5 10*3/uL (ref 0.7–3.1)
Lymphs: 31 %
MCH: 26.5 pg — ABNORMAL LOW (ref 26.6–33.0)
MCHC: 32.5 g/dL (ref 31.5–35.7)
MCV: 82 fL (ref 79–97)
Monocytes Absolute: 0.4 10*3/uL (ref 0.1–0.9)
Monocytes: 8 %
Neutrophils Absolute: 2.8 10*3/uL (ref 1.4–7.0)
Neutrophils: 56 %
Platelets: 315 10*3/uL (ref 150–450)
RBC: 4.64 x10E6/uL (ref 3.77–5.28)
RDW: 12.8 % (ref 11.7–15.4)
WBC: 5 10*3/uL (ref 3.4–10.8)

## 2021-03-22 ENCOUNTER — Other Ambulatory Visit: Payer: Self-pay | Admitting: Nurse Practitioner

## 2021-03-22 DIAGNOSIS — R739 Hyperglycemia, unspecified: Secondary | ICD-10-CM

## 2021-04-02 ENCOUNTER — Ambulatory Visit: Payer: BC Managed Care – PPO | Admitting: Family Medicine

## 2021-04-02 ENCOUNTER — Encounter: Payer: Self-pay | Admitting: Family Medicine

## 2021-04-02 ENCOUNTER — Ambulatory Visit: Payer: BC Managed Care – PPO | Admitting: Family

## 2021-04-02 ENCOUNTER — Other Ambulatory Visit: Payer: Self-pay

## 2021-04-02 VITALS — BP 155/86 | HR 67 | Ht 65.0 in | Wt 286.0 lb

## 2021-04-02 DIAGNOSIS — R0981 Nasal congestion: Secondary | ICD-10-CM

## 2021-04-02 DIAGNOSIS — M130 Polyarthritis, unspecified: Secondary | ICD-10-CM

## 2021-04-02 NOTE — Progress Notes (Signed)
BP (!) 155/86    Pulse 67    Ht 5\' 5"  (1.651 m)    Wt 286 lb (129.7 kg)    LMP 03/06/2021 (Exact Date)    SpO2 97%    BMI 47.59 kg/m    Subjective:   Patient ID: Maria Winters, female    DOB: 1961/02/08, 61 y.o.   MRN: [id number]  HPI: Maria Winters is a 60 y.o. female presenting on 04/02/2021 for Joint Pain (All over- d/c statin 2 weeks ago per Ardeen Fillers)   HPI Patient is coming in today with sore throat, chill and congestion over the past 2-3 days.  She has improvement on sore throat but still has chills.  She also has joint aches for 1 month.  She stopped statin and it did not improve. It is mostly hands and feet and shoulders and ankles and wrists.  The real reason that she is here is the joint aches not as much number sore throat and congestion, it sounds like that is improving.  Relevant past medical, surgical, family and social history reviewed and updated as indicated. Interim medical history since our last visit reviewed. Allergies and medications reviewed and updated.  Review of Systems  Constitutional:  Positive for chills and fever.  HENT:  Positive for congestion, postnasal drip, rhinorrhea and sinus pressure. Negative for ear discharge, ear pain, sneezing and sore throat.   Eyes:  Negative for pain, redness and visual disturbance.  Respiratory:  Positive for cough. Negative for chest tightness and shortness of breath.   Cardiovascular:  Negative for chest pain and leg swelling.  Musculoskeletal:  Positive for arthralgias, joint swelling and myalgias. Negative for back pain and gait problem.  Skin:  Negative for rash.  Neurological:  Negative for dizziness, light-headedness and headaches.  Psychiatric/Behavioral:  Negative for agitation and behavioral problems.   All other systems reviewed and are negative.  Per HPI unless specifically indicated above   Allergies as of 04/02/2021       Reactions   Esomeprazole Magnesium    Patient states that the Nexium caused  tremors        Medication List        Accurate as of April 02, 2021  3:56 PM. If you have any questions, ask your nurse or doctor.          STOP taking these medications    atorvastatin 20 MG tablet Commonly known as: LIPITOR Stopped by: Fransisca Kaufmann Deanne Bedgood, MD       TAKE these medications    acetaminophen 325 MG tablet Commonly known as: TYLENOL Take 650 mg by mouth every 6 (six) hours as needed.   apixaban 5 MG Tabs tablet Commonly known as: ELIQUIS Take 1 tablet (5 mg total) by mouth 2 (two) times daily.   clobetasol ointment 0.05 % Commonly known as: TEMOVATE APPLY  OINTMENT TOPICALLY TO AFFECTED AREA ONCE DAILY UNTIL CLEAR   dexamethasone 0.5 MG/5ML elixir Take 0.5 mg by mouth 4 (four) times daily.   diclofenac Sodium 1 % Gel Commonly known as: Voltaren Apply 2 g topically 4 (four) times daily.   fluocinonide gel 0.05 % Commonly known as: LIDEX Apply topically 4 (four) times daily.   metoprolol succinate 25 MG 24 hr tablet Commonly known as: TOPROL-XL Take 1 tablet (25 mg total) by mouth daily.         Objective:   BP (!) 155/86    Pulse 67    Ht 5\' 5"  (1.651 m)  Wt 286 lb (129.7 kg)    LMP 03/06/2021 (Exact Date)    SpO2 97%    BMI 47.59 kg/m   Wt Readings from Last 3 Encounters:  04/02/21 286 lb (129.7 kg)  03/20/21 281 lb (127.5 kg)  02/16/21 284 lb (128.8 kg)    Physical Exam Vitals and nursing note reviewed.  Constitutional:      General: She is not in acute distress.    Appearance: She is well-developed. She is not diaphoretic.  Eyes:     Conjunctiva/sclera: Conjunctivae normal.     Pupils: Pupils are equal, round, and reactive to light.  Cardiovascular:     Rate and Rhythm: Normal rate and regular rhythm.     Heart sounds: Normal heart sounds. No murmur heard. Pulmonary:     Effort: Pulmonary effort is normal. No respiratory distress.     Breath sounds: Normal breath sounds. No wheezing.  Musculoskeletal:        General:  Normal range of motion.     Right wrist: Normal. No swelling.     Left wrist: Normal. No swelling.     Right hand: Tenderness present. No swelling or deformity. Normal range of motion.     Left hand: Tenderness present. No swelling or deformity. Normal range of motion.  Skin:    General: Skin is warm and dry.     Findings: No rash.  Neurological:     Mental Status: She is alert and oriented to person, place, and time.     Coordination: Coordination normal.  Psychiatric:        Behavior: Behavior normal.      Assessment & Plan:   Problem List Items Addressed This Visit   None Visit Diagnoses     Polyarthritis    -  Primary   Relevant Orders   Arthritis Panel   Anemia Profile B   Rocky mtn spotted fvr abs pnl(IgG+IgM)   Lyme Disease Serology w/Reflex   Head congestion         We will do testing for polyarthralgia, congestion seems like is not improving so recommend over-the-counter management with Mucinex and Flonase and call us if anything worsens or changes.  It sounds like she is already improving.  Follow up plan: Return if symptoms worsen or fail to improve.  Counseling provided for all of the vaccine components Orders Placed This Encounter  Procedures   Arthritis Panel   Anemia Profile B   Rocky mtn spotted fvr abs pnl(IgG+IgM)   Lyme Disease Serology w/Reflex    Caryl Pina, MD [facility] 04/02/2021, 3:56 PM

## 2021-04-02 NOTE — Progress Notes (Signed)
Nicki Furlan is a 61 y.o. female who presents with sore throat, congestion, and chills x 3 days and joint pain x 1 month. She mostly aches in her hands and feet although her wrists, ankles, and shoulders also ache. They are the worst in the morning and at night except for her ankles which are worse the longer she stands.She states she has tried over the counter flu medication for her more recent symptoms and they have improved.  ROS Constitutional - admits chills, body aches, fatigue, weakness; denies fever, headaches, night sweats HEENT - admits sore throat, congestion; denies sinus pressure, ear pain, changes in vision Cardio - denies chest pain, chest tightness, heart palpitations Pulm - denies cough, sob GI - denies abd pain, n/v/d, constipation, blood in stool GU - denies frequency, painful urination, hematuria  PE HEENT - throat non-erythematous, ear canals clear bil with pearly gray tympanic membranes and visible bone markers, non-palpable lymph nodes Cardio - regular rate rhythm with no murmurs Pulm - lungs clear to auscultation bil  MSK - shoulder rom limited, knuckles on hands and feet swollen, slightly red, slightly tender with limited rom  A&P Top on differential is rheumatoid arthritis also considering lyme and rmsf however less likely given history and PE. Possible viral uri. Send labs for anemia profile and arthritis panel. Instructed patient to continue otc meds for recent symptoms and to call back if they do not improve or worsen.  Leanor Kail, [place] Student 04/02/21

## 2021-04-03 ENCOUNTER — Telehealth: Payer: Self-pay | Admitting: Family

## 2021-04-03 DIAGNOSIS — M130 Polyarthritis, unspecified: Secondary | ICD-10-CM

## 2021-04-03 DIAGNOSIS — M791 Myalgia, unspecified site: Secondary | ICD-10-CM

## 2021-04-03 LAB — ANEMIA PROFILE B
Basophils Absolute: 0.1 10*3/uL (ref 0.0–0.2)
Basos: 1 %
EOS (ABSOLUTE): 0.2 10*3/uL (ref 0.0–0.4)
Eos: 4 %
Ferritin: 178 ng/mL — ABNORMAL HIGH (ref 15–150)
Folate: 4.3 ng/mL (ref >3.0)
Hematocrit: 38.1 % (ref 34.0–46.6)
Hemoglobin: 12.3 g/dL (ref 11.1–15.9)
Immature Grans (Abs): 0 10*3/uL (ref 0.0–0.1)
Immature Granulocytes: 0 %
Iron Saturation: 15 % (ref 15–55)
Iron: 42 ug/dL (ref 27–139)
Lymphocytes Absolute: 2.1 10*3/uL (ref 0.7–3.1)
Lymphs: 34 %
MCH: 26.6 pg (ref 26.6–33.0)
MCHC: 32.3 g/dL (ref 31.5–35.7)
MCV: 83 fL (ref 79–97)
Monocytes Absolute: 0.4 10*3/uL (ref 0.1–0.9)
Monocytes: 7 %
Neutrophils Absolute: 3.3 10*3/uL (ref 1.4–7.0)
Neutrophils: 54 %
Platelets: 308 10*3/uL (ref 150–450)
RBC: 4.62 x10E6/uL (ref 3.77–5.28)
RDW: 13.1 % (ref 11.7–15.4)
Retic Ct Pct: 1.3 % (ref 0.6–2.6)
Total Iron Binding Capacity: 279 ug/dL (ref 250–450)
UIBC: 237 ug/dL (ref 118–369)
Vitamin B-12: 380 pg/mL (ref 232–1245)
WBC: 6.1 10*3/uL (ref 3.4–10.8)

## 2021-04-03 LAB — LYME DISEASE SEROLOGY W/REFLEX: Lyme Total Antibody EIA: NEGATIVE

## 2021-04-03 LAB — ARTHRITIS PANEL
Rheumatoid fact SerPl-aCnc: 494 IU/mL — ABNORMAL HIGH (ref <14.0)
Sed Rate: 70 mm/hr — ABNORMAL HIGH (ref 0–40)
Uric Acid: 5 mg/dL (ref 3.0–7.2)

## 2021-04-03 LAB — ROCKY MTN SPOTTED FVR ABS PNL(IGG+IGM)
RMSF IgG: NEGATIVE
RMSF IgM: 0.41 index (ref 0.00–0.89)

## 2021-04-06 NOTE — Telephone Encounter (Signed)
Please let her know that I sent in a prescription for the raised toilet seat, or I printed out just ask her where she would like it faxed to.  I cannot promise that her insurance will cover this.

## 2021-04-06 NOTE — Telephone Encounter (Signed)
Patient aware and verbalized understanding. °

## 2021-04-06 NOTE — Telephone Encounter (Signed)
Patient wants sent to [facility]

## 2021-04-08 ENCOUNTER — Other Ambulatory Visit: Payer: Self-pay

## 2021-04-08 DIAGNOSIS — Z1211 Encounter for screening for malignant neoplasm of colon: Secondary | ICD-10-CM

## 2021-04-08 DIAGNOSIS — M0580 Other rheumatoid arthritis with rheumatoid factor of unspecified site: Secondary | ICD-10-CM

## 2021-04-08 DIAGNOSIS — R700 Elevated erythrocyte sedimentation rate: Secondary | ICD-10-CM

## 2021-04-09 ENCOUNTER — Telehealth: Payer: Self-pay | Admitting: Family

## 2021-04-09 ENCOUNTER — Encounter (INDEPENDENT_AMBULATORY_CARE_PROVIDER_SITE_OTHER): Payer: Self-pay | Admitting: *Deleted

## 2021-04-09 NOTE — Telephone Encounter (Signed)
Patient aware of Dr Dettinger's insruction.  She wanted me to send you a message and ask if you will fll out a handicap parking form for her.She is aware you are out of the office today.

## 2021-04-09 NOTE — Telephone Encounter (Signed)
Ibuprofen and Aleve are not recommended while you are on Eliquis for blood thinner.  Tylenol can be taken

## 2021-04-10 NOTE — Telephone Encounter (Signed)
Form typed up and placed on providers desk

## 2021-04-10 NOTE — Telephone Encounter (Signed)
Maria Winters can you complete  handicap form for this patient and call and let her know when it is ready.  She will pay fee for completion when she picks it up.

## 2021-04-10 NOTE — Telephone Encounter (Signed)
Ok to complete handicap form.

## 2021-04-14 NOTE — Telephone Encounter (Signed)
[facility] handicap form is at front desk ready to pick up

## 2021-04-17 DIAGNOSIS — Z029 Encounter for administrative examinations, unspecified: Secondary | ICD-10-CM

## 2021-04-20 NOTE — Progress Notes (Signed)
Office Visit Note  Patient: Maria Winters             Date of Birth: 01-18-1961           MRN: [id number]             PCP: Sharion Balloon, FNP Referring: Dettinger, Fransisca Kaufmann, MD Visit Date: 04/21/2021 Occupation:   Subjective:  New Patient (Initial Visit) (Total body joint pain, abnormal labs)   History of Present Illness: SINCLAIRE ARTIGA is a 61 y.o. female here for evaluation of joint pain in multiple areas with elevated RF and sed rate. She reports joint pain starting since about a month ago. Pain is affecting many areas including shoulders, elbows, wrists, fingers, hips, knees, and ankles. She has some night time awakenings due to hand pain. Decreased grip strength is affecting her daily activities including typing and driving. Swelling is most apparent in her hands and on the top of her feet around the base of the toes. She is stiff for about 3 hours each morning before getting some improvement in her mobility. She is not able to take NSAIDs due to anticoagulation for Afib she takes tylenol which helps but has to repeat this about every 8 hours for symptoms returning.  Labs reviewed 03/2021 RF 494 ESR 70 Lyme neg RMSF neg CBC unremarkable CMP unremarkable  Imaging reviewed 01/04/20 Xray right hand FINDINGS: There is no evidence of fracture or dislocation. There is no evidence of arthropathy or other focal bone abnormality. Soft tissues are unremarkable. IMPRESSION: Negative.  Activities of Daily Living:  Patient reports morning stiffness for 2 hours.   Patient Reports nocturnal pain.  Difficulty dressing/grooming: Reports Difficulty climbing stairs: Reports Difficulty getting out of chair: Reports Difficulty using hands for taps, buttons, cutlery, and/or writing: Reports  Review of Systems  Constitutional:  Positive for fatigue.  HENT:  Positive for mouth dryness.   Eyes:  Positive for dryness.  Respiratory:  Negative for shortness of breath.    Cardiovascular:  Positive for swelling in legs/feet.  Gastrointestinal:  Negative for constipation.  Endocrine: Negative for excessive thirst.  Genitourinary:  Negative for difficulty urinating.  Musculoskeletal:  Positive for joint pain, gait problem, joint pain, joint swelling, muscle weakness, morning stiffness and muscle tenderness.  Skin:  Negative for rash.  Allergic/Immunologic: Negative for susceptible to infections.  Neurological:  Negative for numbness.  Hematological:  Negative for bruising/bleeding tendency.  Psychiatric/Behavioral:  Positive for sleep disturbance.    PMFS History:  Patient Active Problem List   Diagnosis Date Noted   Inflammatory arthritis 04/21/2021   Rheumatoid factor positive 04/21/2021   Vitamin D deficiency 04/21/2021   High risk medication use 04/21/2021   Myalgia 03/20/2021   Vertigo 07/23/2019   Constipation 07/23/2019   Anxiety state 03/18/2016   Migraine equivalent 03/18/2016   Morbid obesity ([place]) 06/06/2015   Type 2 diabetes mellitus with other specified complication ([place]) 46/96/2952   S/P lap cholecystectomy July 2014 09/14/2012   GERD (gastroesophageal reflux disease) 04/28/2012    Past Medical History:  Diagnosis Date   Abnormal Pap smear of cervix    in her 20's-- hx of conization/LEEP procedure-normal paps since   Abnormal uterine bleeding    Anemia    ~2005   Arthritis    Chest pain    Fibroid    GERD (gastroesophageal reflux disease)    Helicobacter pylori (H. pylori)    Psoriasis    Reflux    STD (sexually transmitted disease) 1994  Tx'd for Chlamydia   Vision abnormalities    tunnel vision    Family History  Problem Relation Age of Onset   Diabetes Mother    Hypertension Father    Healthy Sister    Hypertension Sister    Hypertension Brother    Gout Brother    Heart attack Brother    Breast cancer Sister 24       lumpectomy   Sickle cell trait Brother    Diabetes Maternal Grandmother        with 2 BKA    Stroke Maternal Grandmother    Breast cancer Maternal Aunt    Past Surgical History:  Procedure Laterality Date   cervix dilated     CHOLECYSTECTOMY N/A 09/13/2012   Procedure: LAPAROSCOPIC CHOLECYSTECTOMY WITH INTRAOPERATIVE CHOLANGIOGRAM;  Surgeon: Pedro Earls, MD;  Location: [facility];  Service: General;  Laterality: N/A;   DILATION AND CURETTAGE OF UTERUS     DILATION AND CURETTAGE, DIAGNOSTIC / [place]   with Conization   ESOPHAGOGASTRODUODENOSCOPY N/A 05/11/2012   Procedure: ESOPHAGOGASTRODUODENOSCOPY (EGD);  Surgeon: Rogene Houston, MD;  Location: [facility];  Service: Endoscopy;  Laterality: N/A;  125   LEEP     Social History   Social History Narrative   Not on file   Immunization History  Administered Date(s) Administered   Influenza Split 04/19/2012   Influenza,inj,Quad PF,6+ Mos 01/24/2015, 12/23/2017, 12/07/2019, 03/20/2021   PFIZER(Purple Top)SARS-COV-2 Vaccination 06/16/2019, 07/07/2019, 01/26/2020   Pneumococcal Conjugate-13 08/31/2016   Pneumococcal Polysaccharide-23 12/07/2019   Tdap 08/31/2016     Objective: Vital Signs: BP (!) 153/90 (BP Location: Right Arm, Patient Position: Sitting, Cuff Size: Normal)    Pulse 69    Resp 18    Ht 5' 5"  (1.651 m)    Wt 292 lb (132.5 kg)    BMI 48.59 kg/m    Physical Exam Constitutional:      Appearance: She is obese.  HENT:     Right Ear: External ear normal.     Left Ear: External ear normal.  Eyes:     Conjunctiva/sclera: Conjunctivae normal.  Cardiovascular:     Rate and Rhythm: Normal rate and regular rhythm.  Pulmonary:     Effort: Pulmonary effort is normal.     Breath sounds: Normal breath sounds.  Musculoskeletal:     Right lower leg: No edema.     Left lower leg: No edema.  Skin:    General: Skin is warm and dry.  Neurological:     Mental Status: She is alert.  Psychiatric:        Mood and Affect: Mood normal.     Musculoskeletal Exam:  Neck full ROM no tenderness Shoulders full ROM  but pain with abduction above horizontal, strength grossly intact Elbows full ROM right elbow tenderness with pressure Right wrist dorsal tenderness to pressure no effusion Tenderness with palpable synovitis over right 3rd PIP and 4th MCP, left 2nd and 3rd PIPs Bilateral lateral hip tenderness, lateral and posterior pain provoked with FABER and SLR worse on the left side Knees full ROM some tenderness without palpable effusions MTP squeeze tenderness on both sides, swelling palpable on dorsal side  CDAI Exam: CDAI Score: 24  Patient Global: 40 mm; Provider Global: 60 mm Swollen: 4 ; Tender: 10  Joint Exam 04/21/2021      Right  Left  Glenohumeral   Tender   Tender  Elbow   Tender     Wrist   Tender  MCP 4  Swollen Tender     PIP 2     Swollen Tender  PIP 3  Swollen Tender  Swollen Tender  Knee   Tender   Tender     Investigation: No additional findings.  Imaging: XR Foot 2 Views Left  Result Date: 04/21/2021 X-ray left foot 2 views Tibiotalar joint space appears normal.  Small plantar calcaneal enthesophyte present.  Small degenerative arthritis appearing changes on dorsal midfoot joints.  MCP joint spaces appear normal.  No visible erosions or abnormal calcifications seen. Impression Mild chronic degenerative appearing changes no erosive disease  XR Foot 2 Views Right  Result Date: 04/21/2021 X-ray right foot 2 views Tibiotalar joint space appears normal.  Mild plantar calcaneal enthesophyte.  Degenerative appearing changes on dorsal midfoot joint margin.  MTP joint spaces appear normal.  No erosions seen no abnormal calcifications. Impression Mild chronic changes no erosive disease  XR Hand 2 View Left  Result Date: 04/21/2021 X-ray left hand 2 views Radiocarpal joint space appears normal.  Carpal joints appear normal.  MCP PIP and DIP joint spaces appear normal.  No erosions seen and bone mineralization appears normal. Impression No significant arthritis  XR Hand 2 View  Right  Result Date: 04/21/2021 X-ray right hand 2 views Radiocarpal joint space appears normal.  Carpal joints appear normal cystic changes in distal scaphoid.  MCP PIP and DIP joint spaces appear normal.  No erosions seen and bone mineralization appears normal. Impression No significant arthritis seen   Recent Labs: Lab Results  Component Value Date   WBC 6.1 04/02/2021   HGB 12.3 04/02/2021   PLT 308 04/02/2021   NA 144 03/20/2021   K 4.5 03/20/2021   CL 105 03/20/2021   CO2 26 03/20/2021   GLUCOSE 128 (H) 03/20/2021   BUN 8 03/20/2021   CREATININE 0.79 03/20/2021   BILITOT 0.4 03/20/2021   ALKPHOS 115 03/20/2021   AST 13 03/20/2021   ALT 18 03/20/2021   PROT 6.5 03/20/2021   ALBUMIN 3.4 (L) 03/20/2021   CALCIUM 9.0 03/20/2021   GFRAA 83 08/24/2019    Speciality Comments: No specialty comments available.  Procedures:  No procedures performed Allergies: Esomeprazole magnesium   Assessment / Plan:     Visit Diagnoses: Inflammatory arthritis - Plan: Cyclic citrul peptide antibody, IgG, XR Hand 2 View Right, XR Hand 2 View Left, XR Foot 2 Views Right, XR Foot 2 Views Left, DG Chest 2 View  Joint pain in multiple areas most severe activity in the fingers and toes which looks very consistent with new seropositive RA.  Also checking CCP antibody today.  Checking x-rays of hands and feet bilaterally.  My review there are mild osteoarthritis changes in the feet hands look pretty normal.  No erosions or bone demineralization seen.  Discussed possible short-term treatment with steroids she reports bad past experience with prednisone intolerance including sleep disturbance.  We will plan to follow-up soon to discuss longer-term DMARD option probably methotrexate.  Rheumatoid factor positive - Plan: Angiotensin converting enzyme, DG Chest 2 View  Checking ACE level checking baseline chest x-ray for underlying causes but suspect RA.  Vitamin D deficiency - Plan: VITAMIN D 25 Hydroxy  (Vit-D Deficiency, Fractures)  Suspected diagnosis, checking vitamin D level today she would be at increased risk for osteoporosis and fracture is with new inflammatory disease diagnosis.  High risk medication use - Plan: Hepatitis B core antibody, IgM, Hepatitis B surface antigen, Hepatitis C antibody, QuantiFERON-TB Gold Plus, HIV Antibody (  routine testing w rflx), DG Chest 2 View  Briefly reviewed treatment plans checking baseline labs for methotrexate as a first-line choice recent CBC and CMP reviewed were normal.  Checking hepatitis B and C serology.  Checking baseline chest x-ray to rule out ILD or nodules.  Going ahead to check QuantiFERON and HIV today before starting any immunosuppression in case later requires transition to biologic DMARD.  Orders: Orders Placed This Encounter  Procedures   XR Hand 2 View Right   XR Hand 2 View Left   XR Foot 2 Views Right   XR Foot 2 Views Left   DG Chest 2 View   Cyclic citrul peptide antibody, IgG   Angiotensin converting enzyme   Hepatitis B core antibody, IgM   Hepatitis B surface antigen   Hepatitis C antibody   QuantiFERON-TB Gold Plus   HIV Antibody (routine testing w rflx)   VITAMIN D 25 Hydroxy (Vit-D Deficiency, Fractures)   No orders of the defined types were placed in this encounter.    Follow-Up Instructions: Return in about 2 weeks (around 05/05/2021) for New pt RA f/u 2wks.   Collier Salina, MD  Note - This record has been created using [employer].  Chart creation errors have been sought, but may not always  have been located. Such creation errors do not reflect on  the standard of medical care.

## 2021-04-21 ENCOUNTER — Ambulatory Visit: Payer: Self-pay

## 2021-04-21 ENCOUNTER — Other Ambulatory Visit: Payer: Self-pay

## 2021-04-21 ENCOUNTER — Ambulatory Visit
Admission: RE | Admit: 2021-04-21 | Discharge: 2021-04-21 | Disposition: A | Discharge status: Home / Self Care | Payer: BC Managed Care – PPO | Source: Ambulatory Visit | Attending: Internal Medicine | Admitting: Internal Medicine

## 2021-04-21 ENCOUNTER — Ambulatory Visit (INDEPENDENT_AMBULATORY_CARE_PROVIDER_SITE_OTHER): Payer: BC Managed Care – PPO | Admitting: Internal Medicine

## 2021-04-21 ENCOUNTER — Encounter: Payer: Self-pay | Admitting: Internal Medicine

## 2021-04-21 VITALS — BP 153/90 | HR 69 | Resp 18 | Ht 65.0 in | Wt 292.0 lb

## 2021-04-21 DIAGNOSIS — M79672 Pain in left foot: Secondary | ICD-10-CM | POA: Diagnosis not present

## 2021-04-21 DIAGNOSIS — M79671 Pain in right foot: Secondary | ICD-10-CM | POA: Diagnosis not present

## 2021-04-21 DIAGNOSIS — E559 Vitamin D deficiency, unspecified: Secondary | ICD-10-CM | POA: Diagnosis not present

## 2021-04-21 DIAGNOSIS — Z79899 Other long term (current) drug therapy: Secondary | ICD-10-CM

## 2021-04-21 DIAGNOSIS — M1990 Unspecified osteoarthritis, unspecified site: Secondary | ICD-10-CM

## 2021-04-21 DIAGNOSIS — M059 Rheumatoid arthritis with rheumatoid factor, unspecified: Secondary | ICD-10-CM | POA: Insufficient documentation

## 2021-04-21 DIAGNOSIS — R768 Other specified abnormal immunological findings in serum: Secondary | ICD-10-CM

## 2021-04-21 DIAGNOSIS — R918 Other nonspecific abnormal finding of lung field: Secondary | ICD-10-CM | POA: Diagnosis not present

## 2021-04-21 DIAGNOSIS — M79641 Pain in right hand: Secondary | ICD-10-CM

## 2021-04-21 DIAGNOSIS — R7689 Other specified abnormal immunological findings in serum: Secondary | ICD-10-CM

## 2021-04-21 DIAGNOSIS — M79642 Pain in left hand: Secondary | ICD-10-CM

## 2021-04-21 DIAGNOSIS — M1380 Other specified arthritis, unspecified site: Secondary | ICD-10-CM

## 2021-04-21 NOTE — Patient Instructions (Signed)
I am checking baseline labs to confirm rheumatoid arthritis as a cause of joint inflammation and for safety to start medication for this.  I am checking xrays of hands and feet to make sure there is no joint damage or injury with the swelling and pain in these areas.  Please go for a baseline chest xray I sent the order to [place] at [facility] screening for any lung problem before we start new medications.

## 2021-04-24 LAB — QUANTIFERON-TB GOLD PLUS
Mitogen-NIL: 6.23 IU/mL
NIL: 0.09 IU/mL
QuantiFERON-TB Gold Plus: NEGATIVE
TB1-NIL: <0 IU/mL
TB2-NIL: <0 IU/mL

## 2021-04-24 LAB — HEPATITIS C ANTIBODY
Hepatitis C Ab: NONREACTIVE
SIGNAL TO CUT-OFF: 0.03 (ref <1.00)

## 2021-04-24 LAB — HIV ANTIBODY (ROUTINE TESTING W REFLEX): HIV 1&2 Ab, 4th Generation: NONREACTIVE

## 2021-04-24 LAB — ANGIOTENSIN CONVERTING ENZYME: Angiotensin-Converting Enzyme: 76 U/L — ABNORMAL HIGH (ref 9–67)

## 2021-04-24 LAB — CYCLIC CITRUL PEPTIDE ANTIBODY, IGG: Cyclic Citrullin Peptide Ab: >250 UNITS — ABNORMAL HIGH

## 2021-04-24 LAB — VITAMIN D 25 HYDROXY (VIT D DEFICIENCY, FRACTURES): Vit D, 25-Hydroxy: 15 ng/mL — ABNORMAL LOW (ref 30–100)

## 2021-04-24 LAB — HEPATITIS B CORE ANTIBODY, IGM: Hep B C IgM: NONREACTIVE

## 2021-04-24 LAB — HEPATITIS B SURFACE ANTIGEN: Hepatitis B Surface Ag: NONREACTIVE

## 2021-05-06 NOTE — Progress Notes (Signed)
Office Visit Note  Patient: Maria Winters             Date of Birth: 05-04-1960           MRN: [id number]             PCP: Sharion Balloon, FNP Referring: Sharion Balloon, FNP Visit Date: 05/07/2021   Subjective:  Follow-up (Bil hand and bil feet pain, fatigue)   History of Present Illness: Maria Winters is a 61 y.o. female here for follow up for new seropositive RA labs at initial visit negative screening for hepatitis and TB her CCP is highly positive and vitamin D low at 15. Xrays showed mild osteoarthritis in the feet otherwise unremarkable. She continues having significant joint pain symptoms affected hands and feet and fatigue. She notices more difficulty performing tasks at work such as pulling drawer handles or turning door knobs with pain or poor grip strength.  Previous HPI 04/21/21 [name] is a 61 y.o. female here for evaluation of joint pain in multiple areas with elevated RF and sed rate. She reports joint pain starting since about a month ago. Pain is affecting many areas including shoulders, elbows, wrists, fingers, hips, knees, and ankles. She has some night time awakenings due to hand pain. Decreased grip strength is affecting her daily activities including typing and driving. Swelling is most apparent in her hands and on the top of her feet around the base of the toes. She is stiff for about 3 hours each morning before getting some improvement in her mobility. She is not able to take NSAIDs due to anticoagulation for Afib she takes tylenol which helps but has to repeat this about every 8 hours for symptoms returning.   Labs reviewed 03/2021 RF 494 ESR 70   Review of Systems  Constitutional:  Positive for fatigue.  HENT:  Positive for mouth dryness.   Eyes:  Negative for dryness.  Respiratory:  Negative for shortness of breath.   Cardiovascular:  Positive for swelling in legs/feet.  Gastrointestinal:  Negative for constipation.  Endocrine:  Negative for excessive thirst.  Genitourinary:  Negative for difficulty urinating.  Musculoskeletal:  Positive for joint pain, joint pain, joint swelling, muscle weakness, morning stiffness and muscle tenderness.  Skin:  Negative for rash.  Allergic/Immunologic: Negative for susceptible to infections.  Neurological:  Positive for weakness.  Hematological:  Negative for bruising/bleeding tendency.  Psychiatric/Behavioral:  Positive for sleep disturbance.    PMFS History:  Patient Active Problem List   Diagnosis Date Noted   Seropositive rheumatoid arthritis ([place]) 04/21/2021   Vitamin D deficiency 04/21/2021   High risk medication use 04/21/2021   Myalgia 03/20/2021   Vertigo 07/23/2019   Constipation 07/23/2019   Anxiety state 03/18/2016   Migraine equivalent 03/18/2016   Morbid obesity ([place]) 06/06/2015   Type 2 diabetes mellitus with other specified complication ([place]) 15/83/0940   S/P lap cholecystectomy July 2014 09/14/2012   GERD (gastroesophageal reflux disease) 04/28/2012    Past Medical History:  Diagnosis Date   Abnormal Pap smear of cervix    in her 20's-- hx of conization/LEEP procedure-normal paps since   Abnormal uterine bleeding    Anemia    ~2005   Arthritis    Chest pain    Fibroid    GERD (gastroesophageal reflux disease)    Helicobacter pylori (H. pylori)    Psoriasis    Reflux    STD (sexually transmitted disease) 1994   Tx'd for Chlamydia  Vision abnormalities    tunnel vision    Family History  Problem Relation Age of Onset   Diabetes Mother    Hypertension Father    Healthy Sister    Hypertension Sister    Hypertension Brother    Gout Brother    Heart attack Brother    Breast cancer Sister 68       lumpectomy   Sickle cell trait Brother    Diabetes Maternal Grandmother        with 2 BKA   Stroke Maternal Grandmother    Breast cancer Maternal Aunt    Past Surgical History:  Procedure Laterality Date   cervix dilated      CHOLECYSTECTOMY N/A 09/13/2012   Procedure: LAPAROSCOPIC CHOLECYSTECTOMY WITH INTRAOPERATIVE CHOLANGIOGRAM;  Surgeon: Pedro Earls, MD;  Location: [facility];  Service: General;  Laterality: N/A;   DILATION AND CURETTAGE OF UTERUS     DILATION AND CURETTAGE, DIAGNOSTIC / [place]   with Conization   ESOPHAGOGASTRODUODENOSCOPY N/A 05/11/2012   Procedure: ESOPHAGOGASTRODUODENOSCOPY (EGD);  Surgeon: Rogene Houston, MD;  [facility];  Service: Endoscopy;  Laterality: N/A;  125   LEEP     Social History   Social History Narrative   Not on file   Immunization History  Administered Date(s) Administered   Influenza Split 04/19/2012   Influenza,inj,Quad PF,6+ Mos 01/24/2015, 12/23/2017, 12/07/2019, 03/20/2021   PFIZER(Purple Top)SARS-COV-2 Vaccination 06/16/2019, 07/07/2019, 01/26/2020   Pneumococcal Conjugate-13 08/31/2016   Pneumococcal Polysaccharide-23 12/07/2019   Tdap 08/31/2016     Objective: Vital Signs: BP (!) 149/78 (BP Location: Right Arm, Patient Position: Sitting, Cuff Size: Normal)    Pulse 78    Resp 17    Ht 5' 5"  (1.651 m)    Wt 287 lb (130.2 kg)    BMI 47.76 kg/m    Physical Exam Constitutional:      Appearance: She is obese.  Skin:    General: Skin is warm and dry.     Findings: No rash.  Neurological:     Mental Status: She is alert.  Psychiatric:        Mood and Affect: Mood normal.     Musculoskeletal Exam:  Elbows full ROM right elbow tenderness with pressure Right wrist dorsal tenderness to pressure no effusion Tenderness with palpable synovitis in PIP joints of both hands Knees full ROM some tenderness without palpable effusions MTP squeeze tenderness on both sides  Investigation: No additional findings.  Imaging: DG Chest 2 View  Result Date: 04/21/2021 CLINICAL DATA:  61 year old female with a history of rheumatoid arthritis and no current complaints EXAM: CHEST - 2 VIEW COMPARISON:  07/02/2020 FINDINGS: Cardiomediastinal  silhouette unchanged in size and contour. Improved aeration compared to the prior, with the previous mild nodularity not as prominent on the current plain film. No increasing nodularity. No pneumothorax or pleural effusion. No new confluent airspace disease. Degenerative changes spine.  No acute displaced fracture IMPRESSION: Improved aeration compared to the prior, with the previous nodularity not as prominent as the comparison. Electronically Signed   By: Corrie Mckusick D.O.   On: 04/21/2021 14:57   XR Foot 2 Views Left  Result Date: 04/21/2021 X-ray left foot 2 views Tibiotalar joint space appears normal.  Small plantar calcaneal enthesophyte present.  Small degenerative arthritis appearing changes on dorsal midfoot joints.  MCP joint spaces appear normal.  No visible erosions or abnormal calcifications seen. Impression Mild chronic degenerative appearing changes no erosive disease  XR Foot  2 Views Right  Result Date: 04/21/2021 X-ray right foot 2 views Tibiotalar joint space appears normal.  Mild plantar calcaneal enthesophyte.  Degenerative appearing changes on dorsal midfoot joint margin.  MTP joint spaces appear normal.  No erosions seen no abnormal calcifications. Impression Mild chronic changes no erosive disease  XR Hand 2 View Left  Result Date: 04/21/2021 X-ray left hand 2 views Radiocarpal joint space appears normal.  Carpal joints appear normal.  MCP PIP and DIP joint spaces appear normal.  No erosions seen and bone mineralization appears normal. Impression No significant arthritis  XR Hand 2 View Right  Result Date: 04/21/2021 X-ray right hand 2 views Radiocarpal joint space appears normal.  Carpal joints appear normal cystic changes in distal scaphoid.  MCP PIP and DIP joint spaces appear normal.  No erosions seen and bone mineralization appears normal. Impression No significant arthritis seen   Recent Labs: Lab Results  Component Value Date   WBC 6.1 04/02/2021   HGB 12.3  04/02/2021   PLT 308 04/02/2021   NA 144 03/20/2021   K 4.5 03/20/2021   CL 105 03/20/2021   CO2 26 03/20/2021   GLUCOSE 128 (H) 03/20/2021   BUN 8 03/20/2021   CREATININE 0.79 03/20/2021   BILITOT 0.4 03/20/2021   ALKPHOS 115 03/20/2021   AST 13 03/20/2021   ALT 18 03/20/2021   PROT 6.5 03/20/2021   ALBUMIN 3.4 (L) 03/20/2021   CALCIUM 9.0 03/20/2021   GFRAA 83 08/24/2019   QFTBGOLDPLUS NEGATIVE 04/21/2021    Speciality Comments: No specialty comments available.  Procedures:  No procedures performed Allergies: Esomeprazole magnesium   Assessment / Plan:     Visit Diagnoses: Seropositive rheumatoid arthritis ([place]) - Plan: methotrexate (RHEUMATREX) 2.5 MG tablet, folic acid (FOLVITE) 1 MG tablet  Symptoms and labs are highly consistent with RA plan to start methotrexate 15 mg PO weekly for treatment and folic acid 1 mg daily. Work letter provided supporting ongoing accommodations in the short term.  High risk medication use  Reviewed risks with methotrexate treatment including sensitivity reactions such as pneumonitis or mucositis and risks for liver toxicity or cytopenias. Baseline labs look fine,. Plan to f/u in 4 weeks after start for repeat labs.  Vitamin D deficiency  Deficient at 47 this may be contributing to fatigue also increases risk for osteoporosis along with inflammatory arthritis risk. Needs to increase supplementation to 5000 units daily during the next 3 months.  Orders: No orders of the defined types were placed in this encounter.  Meds ordered this encounter  Medications   methotrexate (RHEUMATREX) 2.5 MG tablet    Sig: Take 6 tablets (15 mg total) by mouth once a week. Caution:Chemotherapy. Protect from light.    Dispense:  30 tablet    Refill:  1   folic acid (FOLVITE) 1 MG tablet    Sig: Take 1 tablet (1 mg total) by mouth daily.    Dispense:  90 tablet    Refill:  0     Follow-Up Instructions: Return in about 4 weeks (around 06/04/2021) for  RA new MTX start f/u 4wks.   Collier Salina, MD  Note - This record has been created using [employer].  Chart creation errors have been sought, but may not always  have been located. Such creation errors do not reflect on  the standard of medical care.

## 2021-05-07 ENCOUNTER — Encounter: Payer: Self-pay | Admitting: Internal Medicine

## 2021-05-07 ENCOUNTER — Other Ambulatory Visit: Payer: Self-pay

## 2021-05-07 ENCOUNTER — Ambulatory Visit: Payer: BC Managed Care – PPO | Admitting: Internal Medicine

## 2021-05-07 ENCOUNTER — Telehealth: Payer: Self-pay | Admitting: *Deleted

## 2021-05-07 VITALS — BP 149/78 | HR 78 | Resp 17 | Ht 65.0 in | Wt 287.0 lb

## 2021-05-07 DIAGNOSIS — M059 Rheumatoid arthritis with rheumatoid factor, unspecified: Secondary | ICD-10-CM

## 2021-05-07 DIAGNOSIS — E559 Vitamin D deficiency, unspecified: Secondary | ICD-10-CM | POA: Diagnosis not present

## 2021-05-07 DIAGNOSIS — Z79899 Other long term (current) drug therapy: Secondary | ICD-10-CM | POA: Diagnosis not present

## 2021-05-07 MED ORDER — METHOTREXATE 2.5 MG PO TABS: 15.0000 mg | ORAL_TABLET | ORAL | 1 refills | Status: DC

## 2021-05-07 MED ORDER — FOLIC ACID 1 MG PO TABS: 1.0000 mg | ORAL_TABLET | Freq: Every day | ORAL | 0 refills | Status: DC

## 2021-05-07 NOTE — Telephone Encounter (Signed)
-----   Message from Shona Needles, RT sent at 05/07/2021  3:20 PM EST ----- ?Regarding: FW: Vitamin D replacement ?LMOM for patient to call office to discuss lab results. ? ?----- Message ----- ?From: Collier Salina, MD ?Sent: 05/07/2021   2:44 PM EST ?To: [facility] ?Subject: Vitamin D replacement                         ? ?I forgot to discuss Maria Winters's vitamin D deficiency at our visit please contact her to update on this. ?She is low at 15 this should be above 30 minimum and this could be contributing directly to her fatigue. It is also important long term because RA is a risk for developing osteoporosis. ?She should take a daily supplement of 5000 units for the next 3 months to correct this, we can recheck her level after that to see if she can go back to a regular dose. ? ? ?

## 2021-05-07 NOTE — Patient Instructions (Signed)
Methotrexate Tablets What is this medication? METHOTREXATE (METH oh TREX ate) treats inflammatory conditions such as arthritis and psoriasis. It works by decreasing inflammation, which can reduce pain and prevent long-term injury to the joints and skin. It may also be used to treat some types of cancer. It works by slowing down the growth of cancer cells. This medicine may be used for other purposes; ask your health care provider or pharmacist if you have questions. COMMON BRAND NAME(S): Rheumatrex, Trexall What should I tell my care team before I take this medication? They need to know if you have any of these conditions: Fluid in the stomach area or lungs If you often drink alcohol Infection or immune system problems Kidney disease or on hemodialysis Liver disease Low blood counts, like low white cell, platelet, or red cell counts Lung disease Radiation therapy Stomach ulcers Ulcerative colitis An unusual or allergic reaction to methotrexate, other medications, foods, dyes, or preservatives Pregnant or trying to get pregnant Breast-feeding How should I use this medication? Take this medication by mouth with a glass of water. Follow the directions on the prescription label. Take your medication at regular intervals. Do not take it more often than directed. Do not stop taking except on your care team's advice. Make sure you know why you are taking this medication and how often you should take it. If this medication is used for a condition that is not cancer, like arthritis or psoriasis, it should be taken weekly, NOT daily. Taking this medication more often than directed can cause serious side effects, even death. Talk to your care team about safe handling and disposal of this medication. You may need to take special precautions. Talk to your care team about the use of this medication in children. While this medication may be prescribed for selected conditions, precautions do  apply. Overdosage: If you think you have taken too much of this medicine contact a poison control center or emergency room at once. NOTE: This medicine is only for you. Do not share this medicine with others. What if I miss a dose? If you miss a dose, talk with your care team. Do not take double or extra doses. What may interact with this medication? Do not take this medication with any of the following: Acitretin This medication may also interact with the following: Aspirin and aspirin-like medications including salicylates Azathioprine Certain antibiotics like penicillins, tetracycline, and chloramphenicol Certain medications that treat or prevent blood clots like warfarin, apixaban, dabigatran, and rivaroxaban Certain medications for stomach problems like esomeprazole, omeprazole, pantoprazole Cyclosporine Dapsone Diuretics Gold Hydroxychloroquine Live virus vaccines Medications for infection like acyclovir, adefovir, amphotericin B, bacitracin, cidofovir, foscarnet, ganciclovir, gentamicin, pentamidine, vancomycin Mercaptopurine NSAIDs, medications for pain and inflammation, like ibuprofen or naproxen Other cytotoxic agents Pamidronate Pemetrexed Penicillamine Phenylbutazone Phenytoin Probenecid Pyrimethamine Retinoids such as isotretinoin and tretinoin Steroid medications like prednisone or cortisone Sulfonamides like sulfasalazine and trimethoprim/sulfamethoxazole Theophylline Zoledronic acid This list may not describe all possible interactions. Give your health care provider a list of all the medicines, herbs, non-prescription drugs, or dietary supplements you use. Also tell them if you smoke, drink alcohol, or use illegal drugs. Some items may interact with your medicine. What should I watch for while using this medication? Avoid alcoholic drinks. This medication can make you more sensitive to the sun. Keep out of the sun. If you cannot avoid being in the sun, wear  protective clothing and use sunscreen. Do not use sun lamps or tanning beds/booths. You may need   blood work done while you are taking this medication. Call your care team for advice if you get a fever, chills or sore throat, or other symptoms of a cold or flu. Do not treat yourself. This medication decreases your body's ability to fight infections. Try to avoid being around people who are sick. This medication may increase your risk to bruise or bleed. Call your care team if you notice any unusual bleeding. Be careful brushing or flossing your teeth or using a toothpick because you may get an infection or bleed more easily. If you have any dental work done, tell your dentist you are receiving this medication. Check with your care team if you get an attack of severe diarrhea, nausea and vomiting, or if you sweat a lot. The loss of too much body fluid can make it dangerous for you to take this medication. Talk to your care team about your risk of cancer. You may be more at risk for certain types of cancers if you take this medication. Do not become pregnant while taking this medication or for 6 months after stopping it. Women should inform their care team if they wish to become pregnant or think they might be pregnant. Men should not father a child while taking this medication and for 3 months after stopping it. There is potential for serious harm to an unborn child. Talk to your care team for more information. Do not breast-feed an infant while taking this medication or for 1 week after stopping it. This medication may make it more difficult to get pregnant or father a child. Talk to your care team if you are concerned about your fertility. What side effects may I notice from receiving this medication? Side effects that you should report to your care team as soon as possible: Allergic reactions--skin rash, itching, hives, swelling of the face, lips, tongue, or throat Blood clot--pain, swelling, or warmth  in the leg, shortness of breath, chest pain Dry cough, shortness of breath or trouble breathing Infection--fever, chills, cough, sore throat, wounds that don't heal, pain or trouble when passing urine, general feeling of discomfort or being unwell Kidney injury--decrease in the amount of urine, swelling of the ankles, hands, or feet Liver injury--right upper belly pain, loss of appetite, nausea, light-colored stool, dark yellow or brown urine, yellowing of the skin or eyes, unusual weakness or fatigue Low red blood cell count--unusual weakness or fatigue, dizziness, headache, trouble breathing Redness, blistering, peeling, or loosening of the skin, including inside the mouth Seizures Unusual bruising or bleeding Side effects that usually do not require medical attention (report to your care team if they continue or are bothersome): Diarrhea Dizziness Hair loss Nausea Pain, redness, or swelling with sores inside the mouth or throat Vomiting This list may not describe all possible side effects. Call your doctor for medical advice about side effects. You may report side effects to FDA at 1-800-FDA-1088. Where should I keep my medication? Keep out of the reach of children and pets. Store at room temperature between 20 and 25 degrees C (68 and 77 degrees F). Protect from light. Get rid of any unused medication after the expiration date. Talk to your care team about how to dispose of unused medication. Special directions may apply. NOTE: This sheet is a summary. It may not cover all possible information. If you have questions about this medicine, talk to your doctor, pharmacist, or health care provider.  2022 Elsevier/Gold Standard (2020-04-28 00:00:00)  

## 2021-05-08 NOTE — Telephone Encounter (Signed)
Attempted to contact the patient and left message for patient to call the office.  

## 2021-05-11 ENCOUNTER — Ambulatory Visit: Payer: BC Managed Care – PPO | Admitting: Neurology

## 2021-05-11 ENCOUNTER — Other Ambulatory Visit: Payer: Self-pay | Admitting: Dermatology

## 2021-05-11 ENCOUNTER — Encounter: Payer: Self-pay | Admitting: Neurology

## 2021-05-11 ENCOUNTER — Telehealth: Payer: Self-pay | Admitting: Internal Medicine

## 2021-05-11 VITALS — BP 135/80 | HR 69 | Ht 65.0 in | Wt 287.0 lb

## 2021-05-11 DIAGNOSIS — R002 Palpitations: Secondary | ICD-10-CM

## 2021-05-11 DIAGNOSIS — L409 Psoriasis, unspecified: Secondary | ICD-10-CM

## 2021-05-11 DIAGNOSIS — G4719 Other hypersomnia: Secondary | ICD-10-CM

## 2021-05-11 DIAGNOSIS — Z6841 Body Mass Index (BMI) 40.0 and over, adult: Secondary | ICD-10-CM

## 2021-05-11 DIAGNOSIS — I480 Paroxysmal atrial fibrillation: Secondary | ICD-10-CM | POA: Insufficient documentation

## 2021-05-11 DIAGNOSIS — Z9189 Other specified personal risk factors, not elsewhere classified: Secondary | ICD-10-CM | POA: Insufficient documentation

## 2021-05-11 NOTE — Telephone Encounter (Signed)
Patient called the office requesting a return call regarding labs. ?

## 2021-05-11 NOTE — Patient Instructions (Signed)
Quality Sleep Information, Adult ?Quality sleep is important for your mental and physical health. It also improves your quality of life. Quality sleep means you: ?Are asleep for most of the time you are in bed. ?Fall asleep within 30 minutes. ?Wake up no more than once a night.  ?Are awake for no longer than 20 minutes if you do wake up during the night. ?Most adults need 7-8 hours of quality sleep each night. ?How can poor sleep affect me? ?If you do not get enough quality sleep, you may have: ?Mood swings. ?Daytime sleepiness. ?Confusion. ?Decreased reaction time. ?Sleep disorders, such as insomnia and sleep apnea. ?Difficulty with: ?Solving problems. ?Coping with stress. ?Paying attention. ?These issues may affect your performance and productivity at work, school, and at home. Lack of sleep may also put you at higher risk for accidents, suicide, and risky behaviors. ?If you do not get quality sleep you may also be at higher risk for several health problems, including: ?Infections. ?Type 2 diabetes. ?Heart disease. ?High blood pressure. ?Obesity. ?Worsening of long-term conditions, like arthritis, kidney disease, depression, Parkinson's disease, and epilepsy. ?What actions can I take to get more quality sleep? ?  ?Stick to a sleep schedule. Go to sleep and wake up at about the same time each day. Do not try to sleep less on weekdays and make up for lost sleep on weekends. This does not work. ?Try to get about 30 minutes of exercise on most days. Do not exercise 2-3 hours before going to bed. ?Limit naps during the day to 30 minutes or less. ?Do not use any products that contain nicotine or tobacco, such as cigarettes or e-cigarettes. If you need help quitting, ask your health care provider. ?Do not drink caffeinated beverages for at least 8 hours before going to bed. Coffee, tea, and some sodas contain caffeine. ?Do not drink alcohol close to bedtime. ?Do not eat large meals close to bedtime. ?Do not take naps in  the late afternoon. ?Try to get at least 30 minutes of sunlight every day. Morning sunlight is best. ?Make time to relax before bed. Reading, listening to music, or taking a hot bath promotes quality sleep. ?Make your bedroom a place that promotes quality sleep. Keep your bedroom dark, quiet, and at a comfortable room temperature. Make sure your bed is comfortable. Take out sleep distractions like TV, a computer, smartphone, and bright lights. ?If you are lying awake in bed for longer than 20 minutes, get up and do a relaxing activity until you feel sleepy. ?Work with your health care provider to treat medical conditions that may affect sleeping, such as: ?Nasal obstruction. ?Snoring. ?Sleep apnea and other sleep disorders. ?Talk to your health care provider if you think any of your prescription medicines may cause you to have difficulty falling or staying asleep. ?If you have sleep problems, talk with a sleep consultant. If you think you have a sleep disorder, talk with your health care provider about getting evaluated by a specialist. ?Where to find more information ?[place] website: https://sleepfoundation.org ?National Heart, Lung, and [place] ([place]): http://www.saunders.info/.pdf ?Centers for Disease Control and Prevention (CDC): LearningDermatology.pl ?Contact a health care provider if you: ?Have trouble getting to sleep or staying asleep. ?Often wake up very early in the morning and cannot get back to sleep. ?Have daytime sleepiness. ?Have daytime sleep attacks of suddenly falling asleep and sudden muscle weakness (narcolepsy). ?Have a tingling sensation in your legs with a strong urge to move your legs (restless  legs syndrome). ?Stop breathing briefly during sleep (sleep apnea). ?Think you have a sleep disorder or are taking a medicine that is affecting your quality of sleep. ?Summary ?Most adults need 7-8 hours of quality sleep each  night. ?Getting enough quality sleep is an important part of health and well-being. ?Make your bedroom a place that promotes quality sleep and avoid things that may cause you to have poor sleep, such as alcohol, caffeine, smoking, and large meals. ?Talk to your health care provider if you have trouble falling asleep or staying asleep. ?This information is not intended to replace advice given to you by your health care provider. Make sure you discuss any questions you have with your health care provider. ?Document Revised: 06/01/2017 Document Reviewed: 06/01/2017 ?Elsevier Patient Education ? [place]. ?Screening for Sleep Apnea ?Sleep apnea is a condition in which breathing pauses or becomes shallow during sleep. Sleep apnea screening is a test to determine if you are at risk for sleep apnea. The test includes a series of questions. It will only takes a few minutes. Your health care provider may ask you to have this test in preparation for surgery or as part of a physical exam. ?What are the symptoms of sleep apnea? ?Common symptoms of sleep apnea include: ?Snoring. ?Waking up often at night. ?Daytime sleepiness. ?Pauses in breathing. ?Choking or gasping during sleep. ?Irritability. ?Forgetfulness. ?Trouble thinking clearly. ?Depression. ?Personality changes. ?Most people with sleep apnea do not know that they have it. ?What are the advantages of sleep apnea screening? ?Getting screened for sleep apnea can help: ?Ensure your safety. It is important for your health care providers to know whether or not you have sleep apnea, especially if you are having surgery or have other long-term (chronic) health conditions. ?Improve your health and allow you to get a better night's rest. Restful sleep can help you: ?Have more energy. ?Lose weight. ?Improve high blood pressure. ?Improve diabetes management. ?Prevent stroke. ?Prevent car accidents. ?What happens during the screening? ?Screening usually includes being  asked a list of questions about your sleep quality. Some questions you may be asked include: ?Do you snore? ?Is your sleep restless? ?Do you have daytime sleepiness? ?Has a partner or spouse told you that you stop breathing during sleep? ?Have you had trouble concentrating or memory loss? ?What is your age? ?What is your neck circumference? ?To measure your neck, keep your back straight and gently wrap the tape measure around your neck. Put the tape measure at the middle of your neck, between your chin and collarbone. ?What is your sex assigned at birth? ?Do you have or are you being treated for high blood pressure? ?If your screening test is positive, you are at risk for the condition. Further testing may be needed to confirm a diagnosis of sleep apnea. ?Where to find more information ?You can find screening tools online or at your health care clinic. For more information about sleep apnea screening and healthy sleep, visit these websites: ?Centers for Disease Control and Prevention: http://www.wolf.info/ ?American Sleep Apnea Association: www.sleepapnea.org ?Contact a health care provider if: ?You think that you may have sleep apnea. ?Summary ?Sleep apnea screening can help determine if you are at risk for sleep apnea. ?It is important for your health care providers to know whether or not you have sleep apnea, especially if you are having surgery or have other chronic health conditions. ?You may be asked to take a screening test for sleep apnea in preparation for surgery or as part  of a physical exam. ?This information is not intended to replace advice given to you by your health care provider. Make sure you discuss any questions you have with your health care provider. ?Document Revised: 02/01/2020 Document Reviewed: 02/01/2020 ?Elsevier Patient Education ? [place]. ? ?

## 2021-05-11 NOTE — Telephone Encounter (Signed)
I called patient, patient verbalized understanding. 

## 2021-05-11 NOTE — Progress Notes (Addendum)
SLEEP MEDICINE CLINIC    Provider:  Larey Seat, MD  Primary Care Physician:  Maria Winters, [address] [place] [address]     Referring Provider: Dr Maria Winters office. [facility].    Maria Winters, [address] [place],  [place] [address]          Chief Complaint according to patient   Patient presents with:     New Patient (Initial Visit)           HISTORY OF PRESENT ILLNESS:  Maria Winters is a 61 y.o. year old 61 or [place] American female patient seen here as a referral on 05/11/2021 from Dr Maria Winters office  for a sleep consultation. .  Chief concern according to patient :  Excessive Daytime Sleepiness, obesity, atrial fibrillation. April 2022 was the time of diagnosis. In 03-2016 she was seen by dr Maria Winters, upon referral by ophthalmology, for migraine auras. These have resolved.     Maria Winters   has a past medical history of Abnormal Pap smear of cervix, Abnormal uterine bleeding, Anemia, Osteo-Arthritis, Rheumatoid arthritis, Chest pain, dry eyes, Fibroid with dysmenorrhea , GERD (gastroesophageal reflux disease), Helicobacter pylori (H. pylori), Psoriasis, Obesity, Reflux, and Vision abnormalities..    Sleep relevant medical history: Nocturia - 2 times or more, gaining weight, eating out a lot.     Family medical /sleep history: no other family member on CPAP with OSA.   Social history:  Patient is working as a Engineer, manufacturing systems, and works the Engineer, petroleum- hospice- and lives in a household alone.  Family status is single.   The patient currently works 8-5  Tobacco use until 1994, was a pack a week-.  ETOH use ; rare ,  Caffeine intake in form of Soda( 1-2/ day) mountain dew- . Regular exercise; not currently .    Sleep habits are as follows: The patient's dinner time is between 7-9 PM. The patient goes to bed at 10 PM and continues to sleep for interval 2 hours, wakes for several   bathroom breaks, the first time at 1-2 AM.   The preferred sleep position is sideways, with the support of 1 pillow. She has a Tv in her bedroom- neck tenderness can be aggravated by position- Dreams are reportedly  frequent/vivid.   6 AM is the usual rise time. The patient wakes up 5.30 with an alarm.   She reports most nights not feeling refreshed or restored in AM, with symptoms such as dry mouth,  and residual fatigue.  Naps are taken frequently, she naps on weekends after lunch-  seating in a chair with a travel pillow,lasting from 60 to 120 minutes and are more  refreshing than nocturnal sleep. This does not take away from the night.     Review of Systems: Out of a complete 14 system review, the patient complains of only the following symptoms, and all other reviewed systems are negative.:  Fatigue, sleepiness , snoring, fragmented sleep, nocturia.    Struggles to stay awake in meeting.  Not waking herself from snoring, she has gone to trips with friends.    How likely are you to doze in the following situations: 0 = not likely, 1 = slight chance, 2 = moderate chance, 3 = high chance   Sitting and Reading? Watching Television? Sitting inactive in a public place (theater or meeting)? As a passenger in a car for an hour without  a break? Lying down in the afternoon when circumstances permit? Sitting and talking to someone? Sitting quietly after lunch without alcohol? In a car, while stopped for a few minutes in traffic?   Total = 15/ 24 points   FSS endorsed at na/ 63 points.   Social History   Socioeconomic History   Marital status: Single    Spouse name: Not on file   Number of children: Not on file   Years of education: Not on file   Highest education level: Not on file  Occupational History   Not on file  Tobacco Use   Smoking status: Former    Packs/day: 0.25    Years: 10.00    Pack years: 2.50    Types: Cigarettes    Quit date: 06/20/1992    Years since quitting:  28.9   Smokeless tobacco: Never  Vaping Use   Vaping Use: Never used  Substance and Sexual Activity   Alcohol use: Not Currently    Comment: 1 glass of wine yearly   Drug use: No   Sexual activity: Never    Birth control/protection: Abstinence  Other Topics Concern   Not on file  Social History Narrative   Not on file   Social Determinants of Health   Financial Resource Strain: Not on file  Food Insecurity: Not on file  Transportation Needs: Not on file  Physical Activity: Not on file  Stress: Not on file  Social Connections: Not on file    Family History  Problem Relation Age of Onset   Diabetes Mother    Hypertension Father    Healthy Sister    Hypertension Sister    Hypertension Brother    Gout Brother    Heart attack Brother    Breast cancer Sister 62       lumpectomy   Sickle cell trait Brother    Diabetes Maternal Grandmother        with 2 BKA   Stroke Maternal Grandmother    Breast cancer Maternal Aunt     Past Medical History:  Diagnosis Date   Abnormal Pap smear of cervix    in her 59's-- hx of conization/LEEP procedure-normal paps since   Abnormal uterine bleeding    Anemia    ~2005   Arthritis    Chest pain    Fibroid    GERD (gastroesophageal reflux disease)    Helicobacter pylori (H. pylori)    Psoriasis    Reflux    STD (sexually transmitted disease) 1994   Tx'd for Chlamydia   Vision abnormalities    tunnel vision    Past Surgical History:  Procedure Laterality Date   cervix dilated     CHOLECYSTECTOMY N/A 09/13/2012   Procedure: LAPAROSCOPIC CHOLECYSTECTOMY WITH INTRAOPERATIVE CHOLANGIOGRAM;  Surgeon: Maria Earls, MD;  Location: [facility];  Service: General;  Laterality: N/A;   DILATION AND CURETTAGE OF UTERUS     DILATION AND CURETTAGE, DIAGNOSTIC / THERAPEUTIC  1989   with Conization   ESOPHAGOGASTRODUODENOSCOPY N/A 05/11/2012   Procedure: ESOPHAGOGASTRODUODENOSCOPY (EGD);  Surgeon: Maria Houston, MD;  Location: [facility];   Service: Endoscopy;  Laterality: N/A;  125   LEEP       Current Outpatient Medications on File Prior to Visit  Medication Sig Dispense Refill   acetaminophen (TYLENOL) 325 MG tablet Take 650 mg by mouth every 6 (six) hours as needed.     apixaban (ELIQUIS) 5 MG TABS tablet Take 1 tablet (5 mg total)  by mouth 2 (two) times daily. 60 tablet 3   clobetasol ointment (TEMOVATE) 0.05 % APPLY  OINTMENT TOPICALLY TO AFFECTED AREA ONCE DAILY UNTIL CLEAR 15 g 0   dexamethasone 0.5 MG/5ML elixir Take 0.5 mg by mouth 4 (four) times daily.     diclofenac Sodium (VOLTAREN) 1 % GEL Apply 2 g topically 4 (four) times daily. 350 g 3   fluocinonide gel (LIDEX) 0.05 % Apply topically 4 (four) times daily.     folic acid (FOLVITE) 1 MG tablet Take 1 tablet (1 mg total) by mouth daily. 90 tablet 0   methotrexate (RHEUMATREX) 2.5 MG tablet Take 6 tablets (15 mg total) by mouth once a week. Caution:Chemotherapy. Protect from light. 30 tablet 1   metoprolol succinate (TOPROL-XL) 25 MG 24 hr tablet Take 1 tablet (25 mg total) by mouth daily. 90 tablet 0   No current facility-administered medications on file prior to visit.    Allergies  Allergen Reactions   Esomeprazole Magnesium     Patient states that the Nexium caused tremors    Mrs. Youngren underwent an ambulatory cardiac telemetry test in spring 2022 which showed normal sinus rhythm average heart rate 70 bpm PACs PVCs ventricular bigeminy night but no atrial fibrillation or flutter..  Echocardiogram from Jul 18, 2020 showed an EF of 66%.  TCD myocardial perfusion test or Lexi scan from August 06, 2020 showed normal ECG under stress.  Markedly reduced exercise tolerance symptoms of dyspnea shortness of breath.  Low risk study.  Medications were reviewed.  The last labs in epic were reviewed.  The patient is not anemic, has a normal creatinine normal electrolytes and normal liver function tests and normal TSH.  Physical exam:  Today's Vitals   05/11/21 0847   BP: 135/80  Pulse: 69  Weight: 287 lb (130.2 kg)  Height: '5\' 5"'$  (1.651 m)   Body mass index is 47.76 kg/m.   Wt Readings from Last 3 Encounters:  05/11/21 287 lb (130.2 kg)  05/07/21 287 lb (130.2 kg)  04/21/21 292 lb (132.5 kg)     Ht Readings from Last 3 Encounters:  05/11/21 '5\' 5"'$  (1.651 m)  05/07/21 '5\' 5"'$  (1.651 m)  04/21/21 '5\' 5"'$  (1.651 m)      General: The patient is awake, alert and appears not in acute distress. The patient is well groomed. Head: Normocephalic, atraumatic. Neck is supple. Mallampati 3,  neck circumference:15.5  inches . Nasal airflow  patent.  Retrognathia is  seen.  Dental status: top teeth bridge-  Cardiovascular:  Regular rate and cardiac rhythm by pulse,  without distended neck veins. Respiratory: Lungs are clear to auscultation.  Skin:  Without evidence of ankle edema, or rash. Trunk: The patient's posture is erect.   Neurologic exam : The patient is awake and alert, oriented to place and time.   Memory subjective described as intact.  Attention span & concentration ability appears normal.  Speech is fluent,  without  dysarthria, dysphonia or aphasia.  Mood and affect are appropriate.   Cranial nerves: no loss of smell or taste reported  Pupils are equal and briskly reactive to light. Funduscopic exam deferred.  Extraocular movements in vertical and horizontal planes were intact and without nystagmus. No Diplopia. Visual fields by finger perimetry are intact. Hearing was intact to soft voice and finger rubbing.Facial sensation intact to fine touch. Facial motor strength is symmetric and tongue and uvula move midline.  Neck ROM : rotation, tilt and flexion extension were normal for  age and shoulder shrug was symmetrical.    Motor exam:  Symmetric bulk, tone and ROM.   Normal tone without cog -wheeling,  grip strength reduced in the right hand over left- pinch strength reduced.  .  Sensory:  Fine touch and vibration were normal.   Proprioception tested in the upper extremities was normal.   Coordination: Rapid alternating movements in the fingers/hands were of normal speed.  The Finger-to-nose maneuver was intact without evidence of ataxia, dysmetria or tremor. Penmen ship has changed since arthritis has increased.    Gait and station: Patient could rise unassisted from a seated position, walked without assistive device.  Stance is of normal width/ base and the patient turned with 3 steps.  Toe and heel walk were deferred.  Deep tendon reflexes: in the  upper and lower extremities are symmetric and intact.  Babinski response was deferred.     After spending a total time of  40  minutes face to face and additional time for physical and neurologic examination, review of laboratory studies,  personal review of imaging studies, reports and results of other testing and review of referral information / records as far as provided in visit, I have established the following assessments:  1)  Hypersomnia is to the level of excessive daytime sleepiness 2)  BMI over 45 - Morbid obesity , high grade Mallampati, and  report of postprandial sleepiness correlate with higher risk of OSA.  3) rheumatic / arthritic pain has limited exercise.  4) history of migraine 5) Diagnosed with atrial fibrillation.    My Plan is to proceed with:  1) HST or PSG can be used for screening.  2) no morning headaches - missing hypoxia symptoms  3) atrial fibrillation-to help reduce the risk of recurrent atrial fibrillation sleep apnea should be evaluated and if found should be treated.  This will also increase the chance of lasting cardioversion or ablation therapy.    Patient with a BMI of over 45 has a higher risk of obesity hypoventilation which is correlated to retaining of CO2 and low oxygen levels at night.  Most of these patients present with central or cyclic breathing and hypopnea.   I would like to thank Maria Balloon, FNP and  Maria Winters, [place],  [place] [address] for allowing me to meet with and to take care of this pleasant patient.   In short, Maria Winters is presenting with EDS, a symptom that can be attributed to OSA, and obesity hypoventilation , with increased risk for central sleep apnea. She reports gasping but not snoring !    I plan to follow up either personally or through our NP within 2-4  months.   CC: I will share my notes with PCP and Cardiologist .  Electronically signed by: Maria Seat, MD 05/11/2021 8:56 AM  [facility] Sleep Board certified by The [employer] of Sleep Medicine and Diplomate of the [employer] of Sleep Medicine. Board certified In Neurology through the [place], Fellow of the [employer] of Neurology. Medical Director of [employer].

## 2021-05-11 NOTE — Telephone Encounter (Signed)
LMOM for patient to call office to discuss lab results. 

## 2021-05-16 DIAGNOSIS — H10013 Acute follicular conjunctivitis, bilateral: Secondary | ICD-10-CM | POA: Diagnosis not present

## 2021-05-16 LAB — HM DIABETES EYE EXAM

## 2021-05-22 ENCOUNTER — Ambulatory Visit: Payer: Self-pay | Admitting: Internal Medicine

## 2021-05-24 ENCOUNTER — Other Ambulatory Visit: Payer: Self-pay | Admitting: Student

## 2021-06-03 ENCOUNTER — Ambulatory Visit (INDEPENDENT_AMBULATORY_CARE_PROVIDER_SITE_OTHER): Payer: BC Managed Care – PPO | Admitting: Neurology

## 2021-06-03 DIAGNOSIS — Z9189 Other specified personal risk factors, not elsewhere classified: Secondary | ICD-10-CM

## 2021-06-03 DIAGNOSIS — G4719 Other hypersomnia: Secondary | ICD-10-CM

## 2021-06-03 DIAGNOSIS — G4733 Obstructive sleep apnea (adult) (pediatric): Secondary | ICD-10-CM | POA: Diagnosis not present

## 2021-06-03 DIAGNOSIS — Z6841 Body Mass Index (BMI) 40.0 and over, adult: Secondary | ICD-10-CM

## 2021-06-03 DIAGNOSIS — R002 Palpitations: Secondary | ICD-10-CM

## 2021-06-03 DIAGNOSIS — I480 Paroxysmal atrial fibrillation: Secondary | ICD-10-CM

## 2021-06-08 NOTE — Progress Notes (Signed)
? ? ?  ?  ?[facility] ?  ?HOME SLEEP TEST REPORT ( by Watch PAT)   ?STUDY DATE:  06-03-2021 ? ?  ?ORDERING CLINICIAN: Larey Seat, MD  ?REFERRING CLINICIAN:  ? ?Followed by [facility] cardiovascular, Lawerance Cruel , [place] ?  ?CLINICAL INFORMATION/HISTORY: Maria Winters is a 61 y.o. African American female patient seen on 05/11/2021 from Dr Irven Shelling office for a sleep consultation. Marland Kitchen  ?Chief concern according to patient :  Excessive Daytime Sleepiness, obesity, atrial fibrillation. April 2022 was the time of diagnosis of atrial fibrillation. In 03-2016 she was seen by dr Erlinda Hong, upon referral by ophthalmology, for migraine auras. These have resolved.   ? Anemia, Osteo-Arthritis, Rheumatoid arthritis, Fibroid with dysmenorrhea and anemia, GERD (gastroesophageal reflux disease), Helicobacter pylori (H. pylori), Psoriasis, morbid Obesity. ? ?  ?Epworth sleepiness score: 15/24.  ?BMI: 47.8kg/m?  ?Neck Circumference: 16" ?  ?FINDINGS: ?  ?Sleep Summary: ?  ?Total Recording Time (hours, min): This home/sleep test recorded a total time of 9 hours and 42 minutes of which sleep time was calculated at 6 hours and 38 minutes.             ?Percent REM (%):   23.4%                                   ?  ?Respiratory Indices: ?  ?Calculated pAHI (per hour):   Total apnea-hypopnea index was 30.5/h exacerbated during REM sleep to 45.7/h and in non-REM sleep AHI was 25.8/h.                        ?  ?Positional AHI: The patient slept almost all night in supine sleep position with an AHI of 30.5/h. ? ?Snoring statistics show a mean volume of 41 dB accompanying 23 percent of total sleep time.  Intermittently snoring level exacerbated to over 60 dB. ? ?Sleep architecture was extremely fragmented.                                                  ?  ?Oxygen Saturation Statistics: ?  ?Oxygen Saturation (%) Mean:    93%         ?  ?O2 Saturation Range (%):    Between a nadir of 85% and a maximum of 98%.                                  ?  ?O2 Saturation (minutes) <89%:   2.3 minutes      ?  ?Pulse Rate Statistics: ?  ?Pulse Mean (bpm): 61 bpm             ?  ?Pulse Range:    Between 45 and 97 bpm           ?  ?IMPRESSION:  This HST confirms the presence of severe sleep apnea with a very strong REM exacerbation.  Nearly all sleep in supine position so a positional component cannot be differentiated.  I feel strongly that this patient is needing positive airway pressure therapy.  Her sleep was extremely fragmented and she endorses a very high daytime sleepiness degree. ?  ?RECOMMENDATION: There was no  prolonged hypoxia noted which excludes the previously entertained diagnosis of obesity hypoventilation.  Still this patient will need positive airway pressure and I will order an auto titration ResMed CPAP with a pressure between 6 and 16 cmH2O, 2 cm EPR, heated humidification and an interface of patient's choice.  The patient stated that she is not snoring to her knowledge.  She reports good nasal patency and for this reason I would like for her to use a nasal mask or nasal cradle or nasal pillow if needed.  Chinstrap. ? ?I also encourage weight loss and trying to sleep on either side, avoiding supine sleep.. ? ?  ?INTERPRETING PHYSICIAN: ? ? Larey Seat, MD  ? ?Medical Director of [facility] [employer].  ? ? ? ? ? ? ? ? ? ? ? ? ? ? ? ? ? ? ? ? ? ?

## 2021-06-11 NOTE — Addendum Note (Signed)
Addended by: Larey Seat on: 06/11/2021 05:35 PM ? ? Modules accepted: Orders ? ?

## 2021-06-11 NOTE — Procedures (Signed)
[facility] ?  ?HOME SLEEP TEST REPORT ( by Watch PAT)   ?STUDY DATE:  06-03-2021 ? ?  ?ORDERING CLINICIAN: Larey Seat, MD  ?REFERRING CLINICIANEinar Gip, MD  ? ?Followed by [facility], Lawerance Cruel , [place] ?  ?CLINICAL INFORMATION/HISTORY: Maria Winters is a 61 y.o. African American female patient seen on 05/11/2021 from Dr Irven Shelling office for a sleep consultation. Marland Kitchen  ?Chief concern according to patient :  Excessive Daytime Sleepiness, obesity, atrial fibrillation. April 2022 was the time of diagnosis of atrial fibrillation. In 03-2016 she was seen by dr Erlinda Hong, upon referral by ophthalmology, for migraine auras. These have resolved.   ? Anemia, Osteo-Arthritis, Rheumatoid arthritis, Fibroid with dysmenorrhea and anemia, GERD (gastroesophageal reflux disease), Helicobacter pylori (H. pylori), Psoriasis, morbid Obesity. ? ?  ?Epworth sleepiness score: 15/24.  ?BMI: 47.8kg/m?  ?Neck Circumference: 16" ?  ?FINDINGS: ?  ?Sleep Summary: ?  ?Total Recording Time (hours, min): This home/sleep test recorded a total time of 9 hours and 42 minutes of which sleep time was calculated at 6 hours and 38 minutes.             ?Percent REM (%):   23.4%                                   ?  ?Respiratory Indices: ?  ?Calculated pAHI (per hour):   Total apnea-hypopnea index was 30.5/h exacerbated during REM sleep to 45.7/h and in non-REM sleep AHI was 25.8/h.                        ?  ?Positional AHI: The patient slept almost all night in supine sleep position with an AHI of 30.5/h. ? ?Snoring statistics show a mean volume of 41 dB accompanying 23 percent of total sleep time.  Intermittently snoring level exacerbated to over 60 dB. ? ?Sleep architecture was extremely fragmented.                                                  ?  ?Oxygen Saturation Statistics: ?  ?Oxygen Saturation (%) Mean:    93%         ?  ?O2 Saturation Range (%):    Between a nadir of 85% and a maximum of 98%.                                  ?  ?O2 Saturation (minutes) <89%:   2.3 minutes      ?  ?Pulse Rate Statistics: ?  ?Pulse Mean (bpm): 61 bpm             ?  ?Pulse Range:    Between 45 and 97 bpm           ?  ?IMPRESSION:  This HST confirms the presence of severe sleep apnea with a very strong REM exacerbation.  Nearly all sleep in supine position so a positional component cannot be differentiated.  I feel strongly that this patient is needing positive airway pressure therapy.  Her sleep was extremely fragmented and she endorses a very high daytime sleepiness degree. ?  ?RECOMMENDATION: There was no prolonged hypoxia noted which  excludes the previously entertained diagnosis of obesity hypoventilation.  Still this patient will need positive airway pressure and I will order an auto titration ResMed CPAP with a pressure between 6 and 16 cmH2O, 2 cm EPR, heated humidification and an interface of patient's choice.  The patient stated that she is not snoring to her knowledge.  She reports good nasal patency and for this reason I would like for her to use a nasal mask or nasal cradle or nasal pillow if needed.  Chinstrap. ? ?I also encourage weight loss and trying to sleep on either side, avoiding supine sleep. ? ?  ?INTERPRETING PHYSICIAN: ? ? Larey Seat, MD  ? ?Medical Director of [facility] at [employer].  ? ? ? ? ? ? ? ? ? ? ? ? ? ? ? ? ? ? ? ? ? ?

## 2021-06-11 NOTE — Progress Notes (Signed)
IMPRESSION:  This HST confirms the presence of severe sleep apnea with a very strong REM exacerbation.  Nearly all sleep in supine position so a positional component cannot be differentiated.  I feel strongly that this patient is needing positive airway pressure therapy.  Her sleep was extremely fragmented and she endorses a very high daytime sleepiness degree. ?? ?RECOMMENDATION: There was no prolonged hypoxia noted which excludes the previously entertained diagnosis of obesity hypoventilation.  Still this patient will need positive airway pressure and I will order an auto titration ResMed CPAP with a pressure between 6 and 16 cmH2O, 2 cm EPR, heated humidification and an interface of patient's choice.  The patient stated that she is not snoring to her knowledge.  She reports good nasal patency and for this reason I would like for her to use a nasal mask or nasal cradle or nasal pillow, if needed with a Chinstrap. ? ?I also encourage weight loss and trying to sleep on either side, avoiding supine sleep.

## 2021-06-15 ENCOUNTER — Telehealth: Payer: Self-pay

## 2021-06-15 NOTE — Telephone Encounter (Signed)
I called patient to discuss. No answer, left a message asking her to call me back. If patient calls back another day please send call to POD 1. ?

## 2021-06-15 NOTE — Telephone Encounter (Signed)
-----   Message from Larey Seat, MD sent at 06/11/2021  5:34 PM EDT ----- ?IMPRESSION:  This HST confirms the presence of severe sleep apnea with a very strong REM exacerbation.  Nearly all sleep in supine position so a positional component cannot be differentiated.  I feel strongly that this patient is needing positive airway pressure therapy.  Her sleep was extremely fragmented and she endorses a very high daytime sleepiness degree. ?? ?RECOMMENDATION: There was no prolonged hypoxia noted which excludes the previously entertained diagnosis of obesity hypoventilation.  Still this patient will need positive airway pressure and I will order an auto titration ResMed CPAP with a pressure between 6 and 16 cmH2O, 2 cm EPR, heated humidification and an interface of patient's choice.  The patient stated that she is not snoring to her knowledge.  She reports good nasal patency and for this reason I would like for her to use a nasal mask or nasal cradle or nasal pillow, if needed with a Chinstrap. ? ?I also encourage weight loss and trying to sleep on either side, avoiding supine sleep. ?

## 2021-06-17 NOTE — Telephone Encounter (Signed)
LVM for pt to call to go over results.

## 2021-06-21 NOTE — Progress Notes (Signed)
? ?Office Visit Note ? ?Patient: CHIANA WAMSER             ?Date of Birth: 05-25-1960           ?MRN: [id number]             ?PCP: Sharion Balloon, FNP ?Referring: Sharion Balloon, FNP ?Visit Date: 06/22/2021 ? ? ?Subjective:  ?Psoriasis (Kidney pain x 3 days taking Tylenol, bil heel pain) ? ? ?History of Present Illness: TERRIANNA HOLSCLAW is a 61 y.o. female here for follow up for seropositive RA after starting methotrexate 15 mg PO weekly. Also increase vitamin D supplementation to 5000 units daily. She misunderstood instructions so started taking methotrexate only 1 tablet weekly for 2 weeks then increased to 6 tablets as planned for the past 2 weeks. She has some diarrhea with the medication but not severe or lasting continuously. She had increased in bilateral heel pain worst when getting up to walk initially, the left side has improved but right side ongoing symptoms. Also now for about 3 days has pain in her left side around the flank or middle and low back. This radiates around to the front. No dysuria, frequency, or hematuria reported. She does not recall any injury. No history of kidney stones or kidney infections. ? ?Previous HPI ?05/07/21 ?OLUWASEMILORE PASCUZZI is a 61 y.o. female here for follow up for new seropositive RA labs at initial visit negative screening for hepatitis and TB her CCP is highly positive and vitamin D low at 15. Xrays showed mild osteoarthritis in the feet otherwise unremarkable. She continues having significant joint pain symptoms affected hands and feet and fatigue. She notices more difficulty performing tasks at work such as pulling drawer handles or turning door knobs with pain or poor grip strength. ?  ?Previous HPI ?04/21/21 ?BERNETTA SUTLEY is a 61 y.o. female here for evaluation of joint pain in multiple areas with elevated RF and sed rate. She reports joint pain starting since about a month ago. Pain is affecting many areas including shoulders, elbows, wrists, fingers,  hips, knees, and ankles. She has some night time awakenings due to hand pain. Decreased grip strength is affecting her daily activities including typing and driving. Swelling is most apparent in her hands and on the top of her feet around the base of the toes. She is stiff for about 3 hours each morning before getting some improvement in her mobility. She is not able to take NSAIDs due to anticoagulation for Afib she takes tylenol which helps but has to repeat this about every 8 hours for symptoms returning. ?  ?Labs reviewed ?03/2021 ?RF 494 ?ESR 70 ? ? ?Review of Systems  ?Constitutional:  Positive for fatigue.  ?HENT:  Positive for mouth dryness.   ?Eyes:  Negative for dryness.  ?Respiratory:  Negative for shortness of breath.   ?Cardiovascular:  Negative for swelling in legs/feet.  ?Gastrointestinal:  Positive for diarrhea.  ?Endocrine: Negative for excessive thirst.  ?Genitourinary:  Negative for difficulty urinating.  ?Musculoskeletal:  Negative for morning stiffness.  ?Skin:  Negative for rash.  ?Allergic/Immunologic: Negative for susceptible to infections.  ?Neurological:  Negative for weakness.  ?Hematological:  Negative for bruising/bleeding tendency.  ?Psychiatric/Behavioral:  Positive for sleep disturbance.   ? ?PMFS History:  ?Patient Active Problem List  ? Diagnosis Date Noted  ? Left flank pain 06/22/2021  ? Morbid obesity with body mass index of 45.0-49.9 in adult [facility]) 05/11/2021  ? Paroxysmal atrial fibrillation (HCC)  05/11/2021  ? Intermittent palpitations 05/11/2021  ? Excessive daytime sleepiness 05/11/2021  ? At risk for central sleep apnea 05/11/2021  ? Seropositive rheumatoid arthritis ([place]) 04/21/2021  ? Vitamin D deficiency 04/21/2021  ? High risk medication use 04/21/2021  ? Myalgia 03/20/2021  ? Vertigo 07/23/2019  ? Constipation 07/23/2019  ? Anxiety state 03/18/2016  ? Migraine equivalent 03/18/2016  ? Morbid obesity ([place]) 06/06/2015  ? Type 2 diabetes mellitus with other specified  complication ([place]) 16/12/9602  ? S/P lap cholecystectomy July 2014 09/14/2012  ? GERD (gastroesophageal reflux disease) 04/28/2012  ?  ?Past Medical History:  ?Diagnosis Date  ? Abnormal Pap smear of cervix   ? in her 27's-- hx of conization/LEEP procedure-normal paps since  ? Abnormal uterine bleeding   ? Anemia   ? ~2005  ? Arthritis   ? Chest pain   ? Fibroid   ? GERD (gastroesophageal reflux disease)   ? Helicobacter pylori (H. pylori)   ? Psoriasis   ? Reflux   ? STD (sexually transmitted disease) 1994  ? Tx'd for Chlamydia  ? Vision abnormalities   ? tunnel vision  ?  ?Family History  ?Problem Relation Age of Onset  ? Diabetes Mother   ? Hypertension Father   ? Healthy Sister   ? Hypertension Sister   ? Hypertension Brother   ? Gout Brother   ? Heart attack Brother   ? Breast cancer Sister 60  ?     lumpectomy  ? Sickle cell trait Brother   ? Diabetes Maternal Grandmother   ?     with 2 BKA  ? Stroke Maternal Grandmother   ? Breast cancer Maternal Aunt   ? ?Past Surgical History:  ?Procedure Laterality Date  ? cervix dilated    ? CHOLECYSTECTOMY N/A 09/13/2012  ? Procedure: LAPAROSCOPIC CHOLECYSTECTOMY WITH INTRAOPERATIVE CHOLANGIOGRAM;  Surgeon: Pedro Earls, MD;  Location: [facility];  Service: General;  Laterality: N/A;  ? DILATION AND CURETTAGE OF UTERUS    ? DILATION AND CURETTAGE, DIAGNOSTIC / THERAPEUTIC  1989  ? with Conization  ? ESOPHAGOGASTRODUODENOSCOPY N/A 05/11/2012  ? Procedure: ESOPHAGOGASTRODUODENOSCOPY (EGD);  Surgeon: Rogene Houston, MD;  [facility];  Service: Endoscopy;  Laterality: N/A;  125  ? LEEP    ? ?Social History  ? ?Social History Narrative  ? Not on file  ? ?Immunization History  ?Administered Date(s) Administered  ? Influenza Split 04/19/2012  ? Influenza,inj,Quad PF,6+ Mos 01/24/2015, 12/23/2017, 12/07/2019, 03/20/2021  ? PFIZER(Purple Top)SARS-COV-2 Vaccination 06/16/2019, 07/07/2019, 01/26/2020  ? Pneumococcal Conjugate-13 08/31/2016  ? Pneumococcal Polysaccharide-23  12/07/2019  ? Tdap 08/31/2016  ?  ? ?Objective: ?Vital Signs: BP 132/75 (BP Location: Left Arm, Patient Position: Sitting, Cuff Size: Normal)   Pulse 71   Resp 16   Ht 5' 5.5" (1.664 m)   Wt 287 lb (130.2 kg)   BMI 47.03 kg/m?   ? ?Physical Exam ?Cardiovascular:  ?   Rate and Rhythm: Normal rate and regular rhythm.  ?Pulmonary:  ?   Effort: Pulmonary effort is normal.  ?   Breath sounds: Normal breath sounds.  ?Musculoskeletal:  ?   Right lower leg: No edema.  ?   Left lower leg: No edema.  ?Skin: ?   General: Skin is warm and dry.  ?   Findings: No rash.  ?Neurological:  ?   Mental Status: She is alert.  ?Psychiatric:     ?   Mood and Affect: Mood normal.  ?  ? ?  Musculoskeletal Exam:  ?Elbows full ROM no tenderness or swelling ?Right wrist tenderness slightly decreased extension ROM no palpable swelling ?Fingers full ROM, mild 2nd-3rd MCP swelling no palpable synovitis ?Left sided tenderness to pressure at paraspinal muscles and along mid and low ribs, no radiation, no tenderness anteriorly ?Knees full ROM mild tenderness no palpable swelling ?Ankles full ROM no tenderness or swelling, right side tenderness on bottom of heel posteriorly ? ? ?CDAI Exam: ?CDAI Score: 12  ?Patient Global: 40 mm; Provider Global: 30 mm ?Swollen: 0 ; Tender: 5  ?Joint Exam 06/22/2021  ? ?   Right  Left  ?Wrist   Tender     ?MCP 2   Tender     ?MCP 3   Tender     ?Knee   Tender   Tender  ? ? ? ?Investigation: ?No additional findings. ? ?Imaging: ?Home sleep test ? ?Result Date: 06/03/2021 ?Dohmeier, Asencion Partridge, MD     06/11/2021  5:33 PM [facility] TEST REPORT ( by [facility])  STUDY DATE:  06-03-2021  ORDERING CLINICIAN: Larey Seat, MD REFERRING CLINICIANEinar Gip, MD Followed by [facility], Lawerance Cruel , PA  CLINICAL INFORMATION/HISTORY: OREATHA FABRY is a 61 y.o. African American female patient seen on 05/11/2021 from Dr Irven Shelling office for a sleep consultation. . Chief concern according to  patient :  Excessive Daytime Sleepiness, obesity, atrial fibrillation. April 2022 was the time of diagnosis of atrial fibrillation. In 03-2016 she was seen by dr Erlinda Hong, upon referral by ophthalmology, for migraine aura

## 2021-06-22 ENCOUNTER — Encounter: Payer: Self-pay | Admitting: Internal Medicine

## 2021-06-22 ENCOUNTER — Ambulatory Visit: Payer: BC Managed Care – PPO | Admitting: Internal Medicine

## 2021-06-22 ENCOUNTER — Telehealth: Payer: Self-pay

## 2021-06-22 VITALS — BP 132/75 | HR 71 | Resp 16 | Ht 65.5 in | Wt 287.0 lb

## 2021-06-22 DIAGNOSIS — M059 Rheumatoid arthritis with rheumatoid factor, unspecified: Secondary | ICD-10-CM | POA: Diagnosis not present

## 2021-06-22 DIAGNOSIS — R109 Unspecified abdominal pain: Secondary | ICD-10-CM | POA: Insufficient documentation

## 2021-06-22 DIAGNOSIS — K219 Gastro-esophageal reflux disease without esophagitis: Secondary | ICD-10-CM

## 2021-06-22 DIAGNOSIS — R10A2 Flank pain, left side: Secondary | ICD-10-CM

## 2021-06-22 DIAGNOSIS — Z79899 Other long term (current) drug therapy: Secondary | ICD-10-CM | POA: Diagnosis not present

## 2021-06-22 NOTE — Telephone Encounter (Signed)
Patient called to let Dr. Benjamine Mola know that she checked her Methotrexate medication and only has 4 pills remaining.  Patient states it was filled on 05/07/2021 quantity 30.  Patient states she is due to take 6 pills this Thursday, 06/25/21.   ?

## 2021-06-22 NOTE — Telephone Encounter (Signed)
Left message to advise patient we are showing that she picked up her MTX prescription on 06/11/2021 for another 30 tablets and she should have enough medication. Patient advised to contact the office if she has any questions or concerns.  ? ?METHOTREXATE SODIUM 2.5 MG TABS 06/11/2021 35 30 tablet Rice, Resa Miner, MD [place] [phone] ...  ? ?

## 2021-06-22 NOTE — Telephone Encounter (Signed)
I called pt. I advised pt that Dr. Brett Fairy reviewed their sleep study results and found that pt has severe sleep apnea. Dr. Brett Fairy recommends that pt starts auto CPAP. I reviewed PAP compliance expectations with the pt. Pt is agreeable to starting a CPAP. I advised pt that an order will be sent to a DME, [facility], and [facility] will call the pt within about one week after they file with the pt's insurance. [facility] will show the pt how to use the machine, fit for masks, and troubleshoot the CPAP if needed. A follow up appt was made for insurance purposes with Hedwig Morton, NP on 09/07/21 at 11:30 am. Pt verbalized understanding to arrive 15 minutes early and bring their CPAP. A letter with all of this information in it will be mailed to the pt as a reminder. I verified with the pt that the address we have on file is correct. Pt verbalized understanding of results. Pt had no questions at this time but was encouraged to call back if questions arise. I have sent the order to [facility] and have received confirmation that they have received the order. ? ?

## 2021-06-23 LAB — CBC WITH DIFFERENTIAL/PLATELET
Absolute Monocytes: 462 cells/uL (ref 200–950)
Basophils Absolute: 98 cells/uL (ref 0–200)
Basophils Relative: 1.5 %
Eosinophils Absolute: 273 cells/uL (ref 15–500)
Eosinophils Relative: 4.2 %
HCT: 38.7 % (ref 35.0–45.0)
Hemoglobin: 12.8 g/dL (ref 11.7–15.5)
Lymphs Abs: 1905 cells/uL (ref 850–3900)
MCH: 27.4 pg (ref 27.0–33.0)
MCHC: 33.1 g/dL (ref 32.0–36.0)
MCV: 82.9 fL (ref 80.0–100.0)
MPV: 8.6 fL (ref 7.5–12.5)
Monocytes Relative: 7.1 %
Neutro Abs: 3764 cells/uL (ref 1500–7800)
Neutrophils Relative %: 57.9 %
Platelets: 378 10*3/uL (ref 140–400)
RBC: 4.67 10*6/uL (ref 3.80–5.10)
RDW: 14.1 % (ref 11.0–15.0)
Total Lymphocyte: 29.3 %
WBC: 6.5 10*3/uL (ref 3.8–10.8)

## 2021-06-23 LAB — COMPLETE METABOLIC PANEL WITH GFR
AG Ratio: 1 (calc) (ref 1.0–2.5)
ALT: 22 U/L (ref 6–29)
AST: 13 U/L (ref 10–35)
Albumin: 3.6 g/dL (ref 3.6–5.1)
Alkaline phosphatase (APISO): 92 U/L (ref 37–153)
BUN: 10 mg/dL (ref 7–25)
CO2: 32 mmol/L (ref 20–32)
Calcium: 9.4 mg/dL (ref 8.6–10.4)
Chloride: 103 mmol/L (ref 98–110)
Creat: 0.78 mg/dL (ref 0.50–1.05)
Globulin: 3.7 g/dL (calc) (ref 1.9–3.7)
Glucose, Bld: 100 mg/dL — ABNORMAL HIGH (ref 65–99)
Potassium: 4.5 mmol/L (ref 3.5–5.3)
Sodium: 139 mmol/L (ref 135–146)
Total Bilirubin: 0.2 mg/dL (ref 0.2–1.2)
Total Protein: 7.3 g/dL (ref 6.1–8.1)
eGFR: 86 mL/min/{1.73_m2} (ref >=60)

## 2021-06-23 LAB — SEDIMENTATION RATE: Sed Rate: 67 mm/h — ABNORMAL HIGH (ref 0–30)

## 2021-06-23 LAB — URINALYSIS
Bilirubin Urine: NEGATIVE
Glucose, UA: NEGATIVE
Hgb urine dipstick: NEGATIVE
Ketones, ur: NEGATIVE
Leukocytes,Ua: NEGATIVE
Nitrite: NEGATIVE
Protein, ur: NEGATIVE
Specific Gravity, Urine: 1.023 (ref 1.001–1.035)
pH: 6.5 (ref 5.0–8.0)

## 2021-06-26 ENCOUNTER — Telehealth: Payer: Self-pay | Admitting: Internal Medicine

## 2021-06-26 NOTE — Telephone Encounter (Signed)
Patient left a voicemail stating she called [facility] to see if her Methotrexate was ready. Patient states they do not have any prescription in for her. Patient states she was due to take her medication on 4/20 and requests that someone call the pharmacy and figure out what the problem is.  ?

## 2021-06-26 NOTE — Telephone Encounter (Signed)
Spoke with the pharmacy who states the patient last picked up prescription on 05/07/2021 and does have a refill remaining. They are getting the prescription ready for patient. Left message to advise patient.  ?

## 2021-07-12 NOTE — Progress Notes (Signed)
Lab results look fine for continuing the methotrexate. Her sedimentation rate which is a marker for inflammation is slightly less but still above normal. Urine study was completely normal.

## 2021-07-17 DIAGNOSIS — G4733 Obstructive sleep apnea (adult) (pediatric): Secondary | ICD-10-CM | POA: Diagnosis not present

## 2021-07-20 ENCOUNTER — Other Ambulatory Visit: Payer: Self-pay | Admitting: Dermatology

## 2021-07-20 DIAGNOSIS — L409 Psoriasis, unspecified: Secondary | ICD-10-CM

## 2021-07-30 ENCOUNTER — Other Ambulatory Visit: Payer: Self-pay | Admitting: Student

## 2021-07-30 ENCOUNTER — Other Ambulatory Visit: Payer: Self-pay | Admitting: Internal Medicine

## 2021-07-30 DIAGNOSIS — M059 Rheumatoid arthritis with rheumatoid factor, unspecified: Secondary | ICD-10-CM

## 2021-07-30 NOTE — Telephone Encounter (Signed)
Next Visit: 08/17/2021  Last Visit: 06/22/2021  Last Fill: 05/07/2021   DX: Seropositive rheumatoid arthritis   Current Dose per office note 06/22/2021: Methotrexate 15 mg p.o. weekly and folic acid 1 mg daily.  Labs: 06/22/2021 Lab results look fine for continuing the methotrexate  Okay to refill MTX and Folic Acid?

## 2021-08-04 NOTE — Progress Notes (Deleted)
Office Visit Note  Patient: Maria Winters             Date of Birth: Oct 09, 1960           MRN: [id number]             PCP: Junie Spencer, FNP Referring: Junie Spencer, FNP Visit Date: 08/17/2021   Subjective:  No chief complaint on file.   History of Present Illness: Maria Winters is a 61 y.o. female here for follow up for seropositive RA after starting methotrexate 15 mg PO weekly.   Previous HPI 06/22/2021 Maria Winters is a 61 y.o. female here for follow up for seropositive RA after starting methotrexate 15 mg PO weekly. Also increase vitamin D supplementation to 5000 units daily. She misunderstood instructions so started taking methotrexate only 1 tablet weekly for 2 weeks then increased to 6 tablets as planned for the past 2 weeks. She has some diarrhea with the medication but not severe or lasting continuously. She had increased in bilateral heel pain worst when getting up to walk initially, the left side has improved but right side ongoing symptoms. Also now for about 3 days has pain in her left side around the flank or middle and low back. This radiates around to the front. No dysuria, frequency, or hematuria reported. She does not recall any injury. No history of kidney stones or kidney infections.   Previous HPI 05/07/21 Maria Winters is a 61 y.o. female here for follow up for new seropositive RA labs at initial visit negative screening for hepatitis and TB her CCP is highly positive and vitamin D low at 15. Xrays showed mild osteoarthritis in the feet otherwise unremarkable. She continues having significant joint pain symptoms affected hands and feet and fatigue. She notices more difficulty performing tasks at work such as pulling drawer handles or turning door knobs with pain or poor grip strength.   Previous HPI 04/21/21 Maria Winters is a 61 y.o. female here for evaluation of joint pain in multiple areas with elevated RF and sed rate. She reports joint  pain starting since about a month ago. Pain is affecting many areas including shoulders, elbows, wrists, fingers, hips, knees, and ankles. She has some night time awakenings due to hand pain. Decreased grip strength is affecting her daily activities including typing and driving. Swelling is most apparent in her hands and on the top of her feet around the base of the toes. She is stiff for about 3 hours each morning before getting some improvement in her mobility. She is not able to take NSAIDs due to anticoagulation for Afib she takes tylenol which helps but has to repeat this about every 8 hours for symptoms returning.   Labs reviewed 03/2021 RF 494 ESR 70   No Rheumatology ROS completed.   PMFS History:  Patient Active Problem List   Diagnosis Date Noted   Left flank pain 06/22/2021   Morbid obesity with body mass index of 45.0-49.9 in adult [facility]) 05/11/2021   Paroxysmal atrial fibrillation (HCC) 05/11/2021   Intermittent palpitations 05/11/2021   Excessive daytime sleepiness 05/11/2021   At risk for central sleep apnea 05/11/2021   Seropositive rheumatoid arthritis (HCC) 04/21/2021   Vitamin D deficiency 04/21/2021   High risk medication use 04/21/2021   Myalgia 03/20/2021   Vertigo 07/23/2019   Constipation 07/23/2019   Anxiety state 03/18/2016   Migraine equivalent 03/18/2016   Morbid obesity (HCC) 06/06/2015   Type 2 diabetes  mellitus with other specified complication ([place]) 17/40/8144   S/P lap cholecystectomy July 2014 09/14/2012   GERD (gastroesophageal reflux disease) 04/28/2012    Past Medical History:  Diagnosis Date   Abnormal Pap smear of cervix    in her 20's-- hx of conization/LEEP procedure-normal paps since   Abnormal uterine bleeding    Anemia    ~2005   Arthritis    Chest pain    Fibroid    GERD (gastroesophageal reflux disease)    Helicobacter pylori (H. pylori)    Psoriasis    Reflux    STD (sexually transmitted disease) 1994   Tx'd for Chlamydia    Vision abnormalities    tunnel vision    Family History  Problem Relation Age of Onset   Diabetes Mother    Hypertension Father    Healthy Sister    Hypertension Sister    Hypertension Brother    Gout Brother    Heart attack Brother    Breast cancer Sister 61       lumpectomy   Sickle cell trait Brother    Diabetes Maternal Grandmother        with 2 BKA   Stroke Maternal Grandmother    Breast cancer Maternal Aunt    Past Surgical History:  Procedure Laterality Date   cervix dilated     CHOLECYSTECTOMY N/A 09/13/2012   Procedure: LAPAROSCOPIC CHOLECYSTECTOMY WITH INTRAOPERATIVE CHOLANGIOGRAM;  Surgeon: Pedro Earls, MD;  Location[facility];  Service: General;  Laterality: N/A;   DILATION AND CURETTAGE OF UTERUS     DILATION AND CURETTAGE, DIAGNOSTIC / THERAPEUTIC  1989   with Conization   ESOPHAGOGASTRODUODENOSCOPY N/A 05/11/2012   Procedure: ESOPHAGOGASTRODUODENOSCOPY (EGD);  Surgeon: Rogene Houston, MD;  [facility];  Service: Endoscopy;  Laterality: N/A;  125   LEEP     Social History   Social History Narrative   Not on file   Immunization History  Administered Date(s) Administered   Influenza Split 04/19/2012   Influenza,inj,Quad PF,6+ Mos 01/24/2015, 12/23/2017, 12/07/2019, 03/20/2021   PFIZER(Purple Top)SARS-COV-2 Vaccination 06/16/2019, 07/07/2019, 01/26/2020   Pneumococcal Conjugate-13 08/31/2016   Pneumococcal Polysaccharide-23 12/07/2019   Tdap 08/31/2016     Objective: Vital Signs: There were no vitals taken for this visit.   Physical Exam   Musculoskeletal Exam: ***  CDAI Exam: CDAI Score: -- Patient Global: --; Provider Global: -- Swollen: --; Tender: -- Joint Exam 08/17/2021   No joint exam has been documented for this visit   There is currently no information documented on the homunculus. Go to the Rheumatology activity and complete the homunculus joint exam.  Investigation: No additional findings.  Imaging: No results  found.  Recent Labs: Lab Results  Component Value Date   WBC 6.5 06/22/2021   HGB 12.8 06/22/2021   PLT 378 06/22/2021   NA 139 06/22/2021   K 4.5 06/22/2021   CL 103 06/22/2021   CO2 32 06/22/2021   GLUCOSE 100 (H) 06/22/2021   BUN 10 06/22/2021   CREATININE 0.78 06/22/2021   BILITOT 0.2 06/22/2021   ALKPHOS 115 03/20/2021   AST 13 06/22/2021   ALT 22 06/22/2021   PROT 7.3 06/22/2021   ALBUMIN 3.4 (L) 03/20/2021   CALCIUM 9.4 06/22/2021   GFRAA 83 08/24/2019   QFTBGOLDPLUS NEGATIVE 04/21/2021    Speciality Comments: No specialty comments available.  Procedures:  No procedures performed Allergies: Esomeprazole magnesium   Assessment / Plan:     Visit Diagnoses: No diagnosis found.  ***  Orders: No orders of the defined types were placed in this encounter.  No orders of the defined types were placed in this encounter.    Follow-Up Instructions: No follow-ups on file.   Earnestine Mealing, CMA  Note - This record has been created using Editor, commissioning.  Chart creation errors have been sought, but may not always  have been located. Such creation errors do not reflect on  the standard of medical care.

## 2021-08-07 ENCOUNTER — Telehealth: Payer: Self-pay | Admitting: Internal Medicine

## 2021-08-07 NOTE — Telephone Encounter (Signed)
METHOTREXATE 2.5 MG TABS  Sig: SMARTSIG:6 Tablet(s) By Mouth Once a Week  Dispensed: 07/30/2021 12:00 AM  Unit strength: 2.5 mg  Unit form: Tab  Days supply: 35  Dispense Note: [NO ORIGINAL SIG]  Quantity: 30 tablet  Pharmacy: [facility] [address]  [address]  [place] [address]  Phone: [phone]  Per Medication Dispense History. Need to verify if patient pick up prescription on 07/30/2021.

## 2021-08-07 NOTE — Telephone Encounter (Signed)
Patient called the office stating she cannot fill her MTX at [facility] because of back order issues but [facility] in [place] has it. They wont let her fill because they state its too early but she missed her dose yesterday. Patient is out of medication. Patient requests an authorization to fill early.

## 2021-08-07 NOTE — Telephone Encounter (Signed)
Attempted to contact the patient and left message for patient to call the office.  

## 2021-08-11 NOTE — Telephone Encounter (Signed)
Attempted to contact the patient and left message for patient to call the office.  

## 2021-08-12 NOTE — Telephone Encounter (Signed)
Attempted to contact the patient and left message for patient to call the office.  

## 2021-08-14 ENCOUNTER — Ambulatory Visit: Payer: BC Managed Care – PPO | Admitting: Student

## 2021-08-17 ENCOUNTER — Ambulatory Visit: Payer: BC Managed Care – PPO | Admitting: Student

## 2021-08-17 ENCOUNTER — Ambulatory Visit: Payer: BC Managed Care – PPO | Admitting: Internal Medicine

## 2021-08-17 DIAGNOSIS — R109 Unspecified abdominal pain: Secondary | ICD-10-CM

## 2021-08-17 DIAGNOSIS — Z79899 Other long term (current) drug therapy: Secondary | ICD-10-CM

## 2021-08-17 DIAGNOSIS — G4733 Obstructive sleep apnea (adult) (pediatric): Secondary | ICD-10-CM | POA: Diagnosis not present

## 2021-08-17 DIAGNOSIS — M059 Rheumatoid arthritis with rheumatoid factor, unspecified: Secondary | ICD-10-CM

## 2021-08-20 ENCOUNTER — Other Ambulatory Visit: Payer: Self-pay | Admitting: Student

## 2021-08-26 ENCOUNTER — Other Ambulatory Visit: Payer: Self-pay | Admitting: Student

## 2021-08-26 ENCOUNTER — Other Ambulatory Visit: Payer: Self-pay | Admitting: Internal Medicine

## 2021-08-26 DIAGNOSIS — M059 Rheumatoid arthritis with rheumatoid factor, unspecified: Secondary | ICD-10-CM

## 2021-08-28 ENCOUNTER — Telehealth: Payer: Self-pay | Admitting: Family

## 2021-08-28 ENCOUNTER — Encounter: Payer: Self-pay | Admitting: Family

## 2021-08-28 ENCOUNTER — Ambulatory Visit: Payer: BC Managed Care – PPO | Admitting: Nurse Practitioner

## 2021-08-28 ENCOUNTER — Encounter: Payer: Self-pay | Admitting: Nurse Practitioner

## 2021-08-28 VITALS — BP 127/81 | HR 69 | Temp 97.6°F | Ht 65.5 in | Wt 293.4 lb

## 2021-08-28 DIAGNOSIS — J069 Acute upper respiratory infection, unspecified: Secondary | ICD-10-CM | POA: Diagnosis not present

## 2021-08-28 DIAGNOSIS — R300 Dysuria: Secondary | ICD-10-CM | POA: Diagnosis not present

## 2021-08-28 LAB — MICROSCOPIC EXAMINATION

## 2021-08-28 LAB — URINALYSIS, ROUTINE W REFLEX MICROSCOPIC
Bilirubin, UA: NEGATIVE
Glucose, UA: NEGATIVE
Ketones, UA: NEGATIVE
Leukocytes,UA: NEGATIVE
Nitrite, UA: NEGATIVE
Protein,UA: NEGATIVE
Specific Gravity, UA: 1.02 (ref 1.005–1.030)
Urobilinogen, Ur: 0.2 mg/dL (ref 0.2–1.0)
pH, UA: 5.5 (ref 5.0–7.5)

## 2021-08-28 LAB — VERITOR FLU A/B WAIVED
Influenza A: NEGATIVE
Influenza B: NEGATIVE

## 2021-08-28 MED ORDER — AMOXICILLIN-POT CLAVULANATE 875-125 MG PO TABS: 1.0000 | ORAL_TABLET | Freq: Two times a day (BID) | ORAL | 0 refills | Status: DC

## 2021-08-28 NOTE — Telephone Encounter (Signed)
done

## 2021-08-29 ENCOUNTER — Other Ambulatory Visit: Payer: Self-pay | Admitting: Student

## 2021-08-30 LAB — URINE CULTURE

## 2021-09-01 ENCOUNTER — Other Ambulatory Visit: Payer: Self-pay | Admitting: Student

## 2021-09-03 NOTE — Progress Notes (Signed)
Primary Physician/Referring:  Sharion Balloon, FNP  Patient ID: Maria Winters, female    DOB: Sep 26, 1960, 61 y.o.   MRN: [id number]  Chief Complaint  Patient presents with   Atrial Fibrillation   Follow-up    6 months    HPI:    Maria Winters  is a 61 y.o. African-American obese female with history of diabetes, hypertension, hyperlipidemia.  Although patient has lost weight over the last several months and A1c as well as lipid and blood pressure control have improved. She quit smoking in 1994.  Patient diagnosed with paroxysmal atrial fibrillation 06/2020 on cardiac monitor while in emergency department.  Patient was last seen in our office 02/16/2021 at which time resumed Eliquis given history of paroxysmal atrial fibrillation. She was also referred for sleep study at last office visit. Patient now presents for 6 month follow up.  She is now being treated for severe sleep apnea with CPAP, which she reports nightly compliance.  States her energy has been improving over the last 2 months.  She continues to tolerate anticoagulation without bleeding diathesis.  Denies chest pain, dizziness, syncope, near syncope.  Denies orthopnea, PND, leg edema.  Past Medical History:  Diagnosis Date   Abnormal Pap smear of cervix    in her 20's-- hx of conization/LEEP procedure-normal paps since   Abnormal uterine bleeding    Anemia    ~2005   Arthritis    Chest pain    Fibroid    GERD (gastroesophageal reflux disease)    Helicobacter pylori (H. pylori)    Psoriasis    Reflux    STD (sexually transmitted disease) 1994   Tx'd for Chlamydia   Vision abnormalities    tunnel vision   Past Surgical History:  Procedure Laterality Date   cervix dilated     CHOLECYSTECTOMY N/A 09/13/2012   Procedure: LAPAROSCOPIC CHOLECYSTECTOMY WITH INTRAOPERATIVE CHOLANGIOGRAM;  Surgeon: Pedro Earls, MD;  Location: [facility];  Service: General;  Laterality: N/A;   DILATION AND CURETTAGE OF UTERUS      DILATION AND CURETTAGE, DIAGNOSTIC / [place]   with Conization   ESOPHAGOGASTRODUODENOSCOPY N/A 05/11/2012   Procedure: ESOPHAGOGASTRODUODENOSCOPY (EGD);  Surgeon: Rogene Houston, MD;  Location: [facility];  Service: Endoscopy;  Laterality: N/A;  125   LEEP     Family History  Problem Relation Age of Onset   Diabetes Mother    Hypertension Father    Healthy Sister    Hypertension Sister    Hypertension Brother    Gout Brother    Heart attack Brother    Breast cancer Sister 41       lumpectomy   Sickle cell trait Brother    Diabetes Maternal Grandmother        with 2 BKA   Stroke Maternal Grandmother    Breast cancer Maternal Aunt     Social History   Tobacco Use   Smoking status: Former    Packs/day: 0.25    Years: 10.00    Total pack years: 2.50    Types: Cigarettes    Quit date: 06/20/1992    Years since quitting: 29.2   Smokeless tobacco: Never  Substance Use Topics   Alcohol use: Not Currently    Comment: 1 glass of wine yearly   Marital Status: Single   ROS  Review of Systems  Cardiovascular:  Negative for chest pain, claudication, dyspnea on exertion, leg swelling, near-syncope, orthopnea, palpitations, paroxysmal nocturnal dyspnea and syncope.  Hematologic/Lymphatic: Does not bruise/bleed easily.  Gastrointestinal:  Negative for melena.  Neurological:  Negative for dizziness and weakness.   Objective  Blood pressure 132/78, pulse 86, temperature 98.2 F (36.8 C), temperature source Temporal, resp. rate 16, height '5\' 6"'$  (1.676 m), weight 292 lb 6.4 oz (132.6 kg), SpO2 96 %.     09/04/2021   10:35 AM 08/28/2021   12:18 PM 06/22/2021   11:44 AM  Vitals with BMI  Height '5\' 6"'$  5' 5.5" 5' 5.5"  Weight 292 lbs 6 oz 293 lbs 6 oz 287 lbs  BMI 47.22 64.33 29.51  Systolic 884 166 063  Diastolic 78 81 75  Pulse 86 69 71      Physical Exam Vitals reviewed.  Constitutional:      Appearance: She is obese.  Cardiovascular:     Rate and Rhythm:  Normal rate and regular rhythm.     Pulses: Intact distal pulses.     Heart sounds: S1 normal and S2 normal. No murmur heard.    No gallop.  Pulmonary:     Effort: Pulmonary effort is normal. No respiratory distress.     Breath sounds: No wheezing, rhonchi or rales.  Musculoskeletal:     Right lower leg: No edema.     Left lower leg: No edema.   Physical exam unchanged from previous office visit.  Laboratory examination:   Recent Labs    03/20/21 0828 06/22/21 1235  NA 144 139  K 4.5 4.5  CL 105 103  CO2 26 32  GLUCOSE 128* 100*  BUN 8 10  CREATININE 0.79 0.78  CALCIUM 9.0 9.4   CrCl cannot be calculated (Patient's most recent lab result is older than the maximum 21 days allowed.).     Latest Ref Rng & Units 06/22/2021   12:35 PM 03/20/2021    8:28 AM 07/17/2020    2:55 PM  CMP  Glucose 65 - 99 mg/dL 100  128  185   BUN 7 - 25 mg/dL '10  8  10   '$ Creatinine 0.50 - 1.05 mg/dL 0.78  0.79  0.86   Sodium 135 - 146 mmol/L 139  144  139   Potassium 3.5 - 5.3 mmol/L 4.5  4.5  4.3   Chloride 98 - 110 mmol/L 103  105  101   CO2 20 - 32 mmol/L 32  26  25   Calcium 8.6 - 10.4 mg/dL 9.4  9.0  9.4   Total Protein 6.1 - 8.1 g/dL 7.3  6.5  7.3   Total Bilirubin 0.2 - 1.2 mg/dL 0.2  0.4  0.3   Alkaline Phos 44 - 121 IU/L  115  97   AST 10 - 35 U/L '13  13  15   '$ ALT 6 - 29 U/L '22  18  24       '$ Latest Ref Rng & Units 06/22/2021   12:35 PM 04/02/2021    4:06 PM 03/20/2021    8:28 AM  CBC  WBC 3.8 - 10.8 Thousand/uL 6.5  6.1  5.0   Hemoglobin 11.7 - 15.5 g/dL 12.8  12.3  12.3   Hematocrit 35.0 - 45.0 % 38.7  38.1  37.8   Platelets 140 - 400 Thousand/uL 378  308  315     Lipid Panel No results for input(s): "CHOL", "TRIG", "[employer]", "VLDL", "HDL", "CHOLHDL", "LDLDIRECT" in the last 8760 hours.   HEMOGLOBIN A1C Lab Results  Component Value Date   HGBA1C 6.9 07/17/2020   TSH  Recent Labs    03/20/21 0828  TSH 1.390    External labs:  None  Allergies   Allergies   Allergen Reactions   Esomeprazole Magnesium     Patient states that the Nexium caused tremors    Medications Prior to Visit:   Outpatient Medications Prior to Visit  Medication Sig Dispense Refill   acetaminophen (TYLENOL) 325 MG tablet Take 650 mg by mouth every 6 (six) hours as needed.     amoxicillin-clavulanate (AUGMENTIN) 875-125 MG tablet Take 1 tablet by mouth 2 (two) times daily. 14 tablet 0   clobetasol ointment (TEMOVATE) 0.05 % APPLY 1 OINTMENT TOPICALLY ONCE DAILY UNTIL  GONE 15 g 0   dexamethasone 0.5 MG/5ML elixir Take 0.5 mg by mouth 4 (four) times daily.     ELIQUIS 5 MG TABS tablet Take 1 tablet by mouth twice daily 60 tablet 0   fluocinonide gel (LIDEX) 0.05 % Apply topically 4 (four) times daily.     folic acid (FOLVITE) 1 MG tablet Take 1 tablet by mouth once daily 90 tablet 3   methotrexate 2.5 MG tablet TAKE 6 TABLETS BY MOUTH ONCE A WEEK CHEMOTHERAPY,  PROTECT  FROM  LIGHT 78 tablet 0   Polyethylene Glycol 3350 (MIRALAX PO) Take by mouth daily.     metoprolol succinate (TOPROL-XL) 25 MG 24 hr tablet Take 1 tablet (25 mg total) by mouth daily. 90 tablet 0   diclofenac Sodium (VOLTAREN) 1 % GEL Apply 2 g topically 4 (four) times daily. (Patient not taking: Reported on 09/04/2021) 350 g 3   No facility-administered medications prior to visit.   Final Medications at End of Visit    Current Meds  Medication Sig   acetaminophen (TYLENOL) 325 MG tablet Take 650 mg by mouth every 6 (six) hours as needed.   amoxicillin-clavulanate (AUGMENTIN) 875-125 MG tablet Take 1 tablet by mouth 2 (two) times daily.   clobetasol ointment (TEMOVATE) 0.05 % APPLY 1 OINTMENT TOPICALLY ONCE DAILY UNTIL  GONE   dexamethasone 0.5 MG/5ML elixir Take 0.5 mg by mouth 4 (four) times daily.   ELIQUIS 5 MG TABS tablet Take 1 tablet by mouth twice daily   fluocinonide gel (LIDEX) 0.05 % Apply topically 4 (four) times daily.   folic acid (FOLVITE) 1 MG tablet Take 1 tablet by mouth once daily    methotrexate 2.5 MG tablet TAKE 6 TABLETS BY MOUTH ONCE A WEEK CHEMOTHERAPY,  PROTECT  FROM  LIGHT   Polyethylene Glycol 3350 (MIRALAX PO) Take by mouth daily.   [DISCONTINUED] metoprolol succinate (TOPROL-XL) 25 MG 24 hr tablet Take 1 tablet (25 mg total) by mouth daily.   Radiology:   [facility] Chest Portable 1 View 07/02/2020 EKG leads project over the chest. Trachea midline. Cardiomediastinal contours are top normal to mildly enlarged, accentuated by portable technique. Mild central pulmonary vascular engorgement may be present. No consolidation. No sign of pleural effusion. Subtle opacities are seen in the RIGHT and LEFT mid chest. Also at the LEFT lung base along the LEFT heart border. On limited assessment no acute skeletal process.   IMPRESSION: Mild cardiac enlargement with signs of central pulmonary vascular engorgement. Subtle increased interstitial markings and vague opacities could also reflect asymmetric edema or viral infection. Consider PA and lateral chest radiograph to ensure resolution on follow-up.   Cardiac Studies:   [facility] MYOCARDIAL PERFUSION WO LEXISCAN 08/06/2020 Normal ECG stress. The patient exercised for 3 minutes and 2 seconds of a Bruce protocol, achieving approximately 4.66 METs.  Markedly reduced exercise tolerance.  Symptoms of dyspnea.  Normal blood pressure response. Mild breast attenuation noted in the inferior wall in RAW images. Myocardial perfusion is normal. Overall LV systolic function is normal without regional wall motion abnormalities. Stress LV EF: 66%. No previous exam available for comparison. Low risk study.  Ambulatory cardiac telemetry 07/04/2020 - 07/18/2020: Predominant rhythm was sinus with maximum heart rate 40 bpm, maximum heart rate 179 bpm, average heart rate of 70 bpm.  Patient's symptoms correlated with normal sinus rhythm, PACs, and PVCs.  Patient on 48 episodes of supraventricular tachycardia with longest lasting 14.6 seconds and maximum heart rate  179 bpm.  Some episodes of SVT may be possible atrial tachycardia.  Rare PACs and PVCs.  Ventricular bigeminy noted.  No atrial fibrillation or flutter, high degree AV block, or ventricular tachycardia.  [facility] ECHOCARDIOGRAM COMPLETE 54/00/8676 Normal LV systolic function with EF 66%. Left ventricle cavity is normal in size. Normal left ventricular wall thickness. Normal global wall motion. Doppler evidence of grade I (impaired) diastolic dysfunction, normal LAP. Calculated EF 66%. Trileaflet aortic valve.  Trace aortic regurgitation.  EKG:  09/04/2021: Sinus rhythm at a rate of 69 bpm.  Normal axis.  No evidence of ischemia or underlying injury pattern.  Compared EKG 02/16/2021, no significant change.  Assessment     ICD-10-CM   1. Paroxysmal atrial fibrillation (HCC)  I48.0 EKG 12-Lead    2. Mixed hyperlipidemia  E78.2         Medications Discontinued During This Encounter  Medication Reason   diclofenac Sodium (VOLTAREN) 1 % GEL    metoprolol succinate (TOPROL-XL) 25 MG 24 hr tablet Reorder      Meds ordered this encounter  Medications   metoprolol succinate (TOPROL-XL) 25 MG 24 hr tablet    Sig: Take 1 tablet (25 mg total) by mouth daily.    Dispense:  90 tablet    Refill:  3   This patients CHA2DS2-VASc Score 2 (F, DM) and yearly risk of stroke 2.2%.   Recommendations:   ZOI DEVINE is a 61 y.o.  African-American obese female with history of diabetes, hypertension, hyperlipidemia.  Although patient has lost weight over the last several months and A1c as well as lipid and blood pressure control have improved. She quit smoking in 1994.  Patient diagnosed with paroxysmal atrial fibrillation 06/2020 on cardiac monitor while in emergency department.  Patient was last seen in our office 02/16/2021 at which time resumed Eliquis given history of paroxysmal atrial fibrillation. She was also referred for sleep study at last office visit. Patient now presents for 6 month follow up.   Patient is maintaining normal sinus rhythm and physical exam is stable.  She has upcoming appointment with PCP, will defer repeat lipid profile testing to primary.  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  Blood pressure is well controlled.  Encourage patient regarding diet and lifestyle modifications including weight loss and CPAP compliance.  She is otherwise stable from a cardiovascular standpoint.  Follow-up in 6 months, sooner if needed.   Maria Berthold, PA-C 09/04/2021, 11:05 AM Office: [phone]

## 2021-09-03 NOTE — Progress Notes (Signed)
PATIENT: Maria Winters DOB: [birth year]  REASON FOR VISIT: follow up HISTORY FROM: patient PRIMARY NEUROLOGIST: Dr. Brett Fairy  Chief Complaint  Patient presents with   Follow-up    Pt reports feeling better. She states the CPAP has been making things better and she sees improvement in sleeping better and feeling well rested. No questions or concerns. Room  19 alone     HISTORY OF PRESENT ILLNESS: Today 09/07/21:  Maria Winters is a 61 year old female with a history of OSA on CPAP. She returns today for follow-up. Reports CPAP is working well. Denies any new issues. DL is below. Was taking the mask off during the night but has been trying to leave it on the entire time she sleeps.      REVIEW OF SYSTEMS: Out of a complete 14 system review of symptoms, the patient complains only of the following symptoms, and all other reviewed systems are negative.   ESS 15  ALLERGIES: Allergies  Allergen Reactions   Esomeprazole Magnesium     Patient states that the Nexium caused tremors    HOME MEDICATIONS: Outpatient Medications Prior to Visit  Medication Sig Dispense Refill   acetaminophen (TYLENOL) 325 MG tablet Take 650 mg by mouth every 6 (six) hours as needed.     amoxicillin-clavulanate (AUGMENTIN) 875-125 MG tablet Take 1 tablet by mouth 2 (two) times daily. 14 tablet 0   clobetasol ointment (TEMOVATE) 0.05 % APPLY 1 OINTMENT TOPICALLY ONCE DAILY UNTIL  GONE 15 g 0   dexamethasone 0.5 MG/5ML elixir Take 0.5 mg by mouth 4 (four) times daily.     ELIQUIS 5 MG TABS tablet Take 1 tablet by mouth twice daily 60 tablet 0   fluocinonide gel (LIDEX) 0.05 % Apply topically 4 (four) times daily.     folic acid (FOLVITE) 1 MG tablet Take 1 tablet by mouth once daily 90 tablet 3   methotrexate 2.5 MG tablet TAKE 6 TABLETS BY MOUTH ONCE A WEEK CHEMOTHERAPY,  PROTECT  FROM  LIGHT 78 tablet 0   metoprolol succinate (TOPROL-XL) 25 MG 24 hr tablet Take 1 tablet (25 mg total) by mouth  daily. 90 tablet 3   Polyethylene Glycol 3350 (MIRALAX PO) Take by mouth daily.     No facility-administered medications prior to visit.    PAST MEDICAL HISTORY: Past Medical History:  Diagnosis Date   Abnormal Pap smear of cervix    in her 8's-- hx of conization/LEEP procedure-normal paps since   Abnormal uterine bleeding    Anemia    ~2005   Arthritis    Chest pain    Fibroid    GERD (gastroesophageal reflux disease)    Helicobacter pylori (H. pylori)    Psoriasis    Reflux    STD (sexually transmitted disease) 1994   Tx'd for Chlamydia   Vision abnormalities    tunnel vision    PAST SURGICAL HISTORY: Past Surgical History:  Procedure Laterality Date   cervix dilated     CHOLECYSTECTOMY N/A 09/13/2012   Procedure: LAPAROSCOPIC CHOLECYSTECTOMY WITH INTRAOPERATIVE CHOLANGIOGRAM;  Surgeon: Pedro Earls, MD;  Location: [facility];  Service: General;  Laterality: N/A;   DILATION AND CURETTAGE OF UTERUS     DILATION AND CURETTAGE, DIAGNOSTIC / [place]   with Conization   ESOPHAGOGASTRODUODENOSCOPY N/A 05/11/2012   Procedure: ESOPHAGOGASTRODUODENOSCOPY (EGD);  Surgeon: Rogene Houston, MD;  Location: [facility];  Service: Endoscopy;  Laterality: N/A;  125   LEEP  FAMILY HISTORY: Family History  Problem Relation Age of Onset   Diabetes Mother    Hypertension Father    Healthy Sister    Hypertension Sister    Hypertension Brother    Gout Brother    Heart attack Brother    Breast cancer Sister 48       lumpectomy   Sickle cell trait Brother    Diabetes Maternal Grandmother        with 2 BKA   Stroke Maternal Grandmother    Breast cancer Maternal Aunt     SOCIAL HISTORY: Social History   Socioeconomic History   Marital status: Single    Spouse name: Not on file   Number of children: Not on file   Years of education: Not on file   Highest education level: Not on file  Occupational History   Not on file  Tobacco Use   Smoking status: Former     Packs/day: 0.25    Years: 10.00    Total pack years: 2.50    Types: Cigarettes    Quit date: 06/20/1992    Years since quitting: 29.2   Smokeless tobacco: Never  Vaping Use   Vaping Use: Never used  Substance and Sexual Activity   Alcohol use: Not Currently    Comment: 1 glass of wine yearly   Drug use: No   Sexual activity: Never    Birth control/protection: Abstinence  Other Topics Concern   Not on file  Social History Narrative   Not on file   Social Determinants of Health   Financial Resource Strain: Not on file  Food Insecurity: Not on file  Transportation Needs: Not on file  Physical Activity: Not on file  Stress: Not on file  Social Connections: Not on file  Intimate Partner Violence: Not on file      PHYSICAL EXAM  Vitals:   09/07/21 1128  BP: (!) 141/83  Pulse: 68  Weight: 292 lb 8 oz (132.7 kg)  Height: '5\' 6"'$  (1.676 m)   Body mass index is 47.21 kg/m.  Generalized: Well developed, in no acute distress  Chest: Lungs clear to auscultation bilaterally  Neurological examination  Mentation: Alert oriented to time, place, history taking. Follows all commands speech and language fluent Cranial nerve II-XII: Extraocular movements were full, visual field were full on confrontational test Head turning and shoulder shrug  were normal and symmetric. Motor: The motor testing reveals 5 over 5 strength of all 4 extremities. Good symmetric motor tone is noted throughout.  Sensory: Sensory testing is intact to soft touch on all 4 extremities. No evidence of extinction is noted.  Gait and station: Gait is normal.    DIAGNOSTIC DATA (LABS, IMAGING, TESTING) - I reviewed patient records, labs, notes, testing and imaging myself where available.  Lab Results  Component Value Date   WBC 6.5 06/22/2021   HGB 12.8 06/22/2021   HCT 38.7 06/22/2021   MCV 82.9 06/22/2021   PLT 378 06/22/2021      Component Value Date/Time   NA 139 06/22/2021 1235   NA 144  03/20/2021 0828   K 4.5 06/22/2021 1235   CL 103 06/22/2021 1235   CO2 32 06/22/2021 1235   GLUCOSE 100 (H) 06/22/2021 1235   BUN 10 06/22/2021 1235   BUN 8 03/20/2021 0828   CREATININE 0.78 06/22/2021 1235   CALCIUM 9.4 06/22/2021 1235   PROT 7.3 06/22/2021 1235   PROT 6.5 03/20/2021 0828   ALBUMIN 3.4 (L) 03/20/2021 0175  AST 13 06/22/2021 1235   ALT 22 06/22/2021 1235   ALKPHOS 115 03/20/2021 0828   BILITOT 0.2 06/22/2021 1235   BILITOT 0.4 03/20/2021 0828   GFRNONAA >60 07/02/2020 1442   GFRAA 83 08/24/2019 0806   Lab Results  Component Value Date   CHOL 149 08/08/2020   HDL 74 08/08/2020   LDLCALC 64 08/08/2020   TRIG 52 08/08/2020   CHOLHDL 2.8 08/24/2019   Lab Results  Component Value Date   HGBA1C 6.9 07/17/2020   Lab Results  Component Value Date   VITAMINB12 380 04/02/2021   Lab Results  Component Value Date   TSH 1.390 03/20/2021      ASSESSMENT AND PLAN 61 y.o. year old female  has a past medical history of Abnormal Pap smear of cervix, Abnormal uterine bleeding, Anemia, Arthritis, Chest pain, Fibroid, GERD (gastroesophageal reflux disease), Helicobacter pylori (H. pylori), Psoriasis, Reflux, STD (sexually transmitted disease) (1994), and Vision abnormalities. here with:  OSA on CPAP  - CPAP compliance excellent - Good treatment of AHI  - Encourage patient to use CPAP nightly and for the entire time she is sleeping. - F/U in 1 year or sooner if needed    Ward Givens, MSN, NP-C 09/07/2021, 11:46 AM [facility] [address] [place], [place] [address] [phone]

## 2021-09-04 ENCOUNTER — Encounter: Payer: Self-pay | Admitting: Student

## 2021-09-04 ENCOUNTER — Ambulatory Visit: Payer: BC Managed Care – PPO | Admitting: Student

## 2021-09-04 VITALS — BP 132/78 | HR 86 | Temp 98.2°F | Resp 16 | Ht 66.0 in | Wt 292.4 lb

## 2021-09-04 DIAGNOSIS — I480 Paroxysmal atrial fibrillation: Secondary | ICD-10-CM

## 2021-09-04 DIAGNOSIS — E782 Mixed hyperlipidemia: Secondary | ICD-10-CM | POA: Diagnosis not present

## 2021-09-04 MED ORDER — METOPROLOL SUCCINATE ER 25 MG PO TB24
25.0000 mg | ORAL_TABLET | Freq: Every day | ORAL | 3 refills | Status: DC
Start: 2021-09-04 — End: 2022-09-01

## 2021-09-07 ENCOUNTER — Ambulatory Visit (INDEPENDENT_AMBULATORY_CARE_PROVIDER_SITE_OTHER): Payer: BC Managed Care – PPO | Admitting: Adult Health

## 2021-09-07 ENCOUNTER — Ambulatory Visit: Payer: BC Managed Care – PPO | Admitting: Family

## 2021-09-07 VITALS — BP 141/83 | HR 68 | Ht 66.0 in | Wt 292.5 lb

## 2021-09-07 DIAGNOSIS — G4733 Obstructive sleep apnea (adult) (pediatric): Secondary | ICD-10-CM | POA: Diagnosis not present

## 2021-09-07 DIAGNOSIS — Z9989 Dependence on other enabling machines and devices: Secondary | ICD-10-CM

## 2021-09-16 DIAGNOSIS — G4733 Obstructive sleep apnea (adult) (pediatric): Secondary | ICD-10-CM | POA: Diagnosis not present

## 2021-10-05 ENCOUNTER — Other Ambulatory Visit: Payer: Self-pay | Admitting: Student

## 2021-10-06 LAB — HM DIABETES EYE EXAM

## 2021-10-07 NOTE — Progress Notes (Signed)
 Office Visit Note  Patient: Maria Winters             Date of Birth: 02/11/1961           MRN: [id number]             PCP: Hawks, Christy A, FNP Referring: Hawks, Christy A, FNP Visit Date: 10/20/2021   Subjective:  Follow-up (Patient having hip pain, wants to know if associated with RA.)   History of Present Illness: Maria Winters is a 60 y.o. female here for follow up seropositive RA on methotrexate 15 mg p.o. weekly.  She seen a sizable improvement in much of her joint symptoms and has not experienced any trouble taking the medication.  She is having some pain in the right hip most often occurred when sitting in 1 position for prolonged time usually about 30 minutes or longer provokes this.  Otherwise joint problems are mostly doing better and she is not seeing any ongoing swelling changes.   Previous HPI  06/22/2021 Maria Winters is a 60 y.o. female here for follow up for seropositive RA after starting methotrexate 15 mg PO weekly. Also increase vitamin D supplementation to 5000 units daily. She misunderstood instructions so started taking methotrexate only 1 tablet weekly for 2 weeks then increased to 6 tablets as planned for the past 2 weeks. She has some diarrhea with the medication but not severe or lasting continuously. She had increased in bilateral heel pain worst when getting up to walk initially, the left side has improved but right side ongoing symptoms. Also now for about 3 days has pain in her left side around the flank or middle and low back. This radiates around to the front. No dysuria, frequency, or hematuria reported. She does not recall any injury. No history of kidney stones or kidney infections.   Previous HPI 05/07/21 Maria Winters is a 60 y.o. female here for follow up for new seropositive RA labs at initial visit negative screening for hepatitis and TB her CCP is highly positive and vitamin D low at 15. Xrays showed mild osteoarthritis in the feet  otherwise unremarkable. She continues having significant joint pain symptoms affected hands and feet and fatigue. She notices more difficulty performing tasks at work such as pulling drawer handles or turning door knobs with pain or poor grip strength.   Previous HPI 04/21/21 Maria Winters is a 60 y.o. female here for evaluation of joint pain in multiple areas with elevated RF and sed rate. She reports joint pain starting since about a month ago. Pain is affecting many areas including shoulders, elbows, wrists, fingers, hips, knees, and ankles. She has some night time awakenings due to hand pain. Decreased grip strength is affecting her daily activities including typing and driving. Swelling is most apparent in her hands and on the top of her feet around the base of the toes. She is stiff for about 3 hours each morning before getting some improvement in her mobility. She is not able to take NSAIDs due to anticoagulation for Afib she takes tylenol which helps but has to repeat this about every 8 hours for symptoms returning.   Labs reviewed 03/2021 RF 494 ESR 70   Review of Systems  Constitutional:  Negative for fatigue.  HENT:  Negative for mouth sores and mouth dryness.   Eyes:  Negative for dryness.  Respiratory:  Negative for shortness of breath.   Cardiovascular:  Negative for chest pain and palpitations.    Gastrointestinal:  Negative for blood in stool, constipation and diarrhea.  Endocrine: Negative for increased urination.  Genitourinary:  Negative for involuntary urination.  Musculoskeletal:  Positive for joint pain and joint pain. Negative for gait problem, joint swelling, myalgias, muscle weakness, morning stiffness, muscle tenderness and myalgias.  Skin:  Negative for color change, rash, hair loss and sensitivity to sunlight.  Allergic/Immunologic: Negative for susceptible to infections.  Neurological:  Negative for dizziness and headaches.  Hematological:  Negative for swollen  glands.  Psychiatric/Behavioral:  Negative for depressed mood and sleep disturbance. The patient is not nervous/anxious.     PMFS History:  Patient Active Problem List   Diagnosis Date Noted   Left flank pain 06/22/2021   Morbid obesity with body mass index of 45.0-49.9 in adult (HCC) 05/11/2021   Paroxysmal atrial fibrillation (HCC) 05/11/2021   Intermittent palpitations 05/11/2021   Excessive daytime sleepiness 05/11/2021   At risk for central sleep apnea 05/11/2021   Seropositive rheumatoid arthritis (HCC) 04/21/2021   Vitamin D deficiency 04/21/2021   High risk medication use 04/21/2021   Myalgia 03/20/2021   Vertigo 07/23/2019   Constipation 07/23/2019   Anxiety state 03/18/2016   Migraine equivalent 03/18/2016   Morbid obesity (HCC) 06/06/2015   Type 2 diabetes mellitus with other specified complication (HCC) 06/06/2015   S/P lap cholecystectomy July 2014 09/14/2012   GERD (gastroesophageal reflux disease) 04/28/2012    Past Medical History:  Diagnosis Date   Abnormal Pap smear of cervix    in her 20's-- hx of conization/LEEP procedure-normal paps since   Abnormal uterine bleeding    Anemia    ~2005   Arthritis    Chest pain    Fibroid    GERD (gastroesophageal reflux disease)    Helicobacter pylori (H. pylori)    Psoriasis    Reflux    STD (sexually transmitted disease) 1994   Tx'd for Chlamydia   Vision abnormalities    tunnel vision    Family History  Problem Relation Age of Onset   Diabetes Mother    Hypertension Father    Healthy Sister    Hypertension Sister    Hypertension Brother    Gout Brother    Heart attack Brother    Breast cancer Sister 56       lumpectomy   Sickle cell trait Brother    Diabetes Maternal Grandmother        with 2 BKA   Stroke Maternal Grandmother    Breast cancer Maternal Aunt    Past Surgical History:  Procedure Laterality Date   cervix dilated     CHOLECYSTECTOMY N/A 09/13/2012   Procedure: LAPAROSCOPIC  CHOLECYSTECTOMY WITH INTRAOPERATIVE CHOLANGIOGRAM;  Surgeon: Matthew B Martin, MD;  Location: [facility];  Service: General;  Laterality: N/A;   DILATION AND CURETTAGE OF UTERUS     DILATION AND CURETTAGE, DIAGNOSTIC / THERAPEUTIC  1989   with Conization   ESOPHAGOGASTRODUODENOSCOPY N/A 05/11/2012   Procedure: ESOPHAGOGASTRODUODENOSCOPY (EGD);  Surgeon: Najeeb U Rehman, MD;  Location: [facility];  Service: Endoscopy;  Laterality: N/A;  125   LEEP     Social History   Social History Narrative   Not on file   Immunization History  Administered Date(s) Administered   Influenza Split 04/19/2012   Influenza,inj,Quad PF,6+ Mos 01/24/2015, 12/23/2017, 12/07/2019, 03/20/2021   PFIZER(Purple Top)SARS-COV-2 Vaccination 06/16/2019, 07/07/2019, 01/26/2020   Pneumococcal Conjugate-13 08/31/2016   Pneumococcal Polysaccharide-23 12/07/2019   Tdap 08/31/2016     Objective: Vital Signs: BP 105/67 (BP   Location: Left Arm, Patient Position: Sitting, Cuff Size: Normal)   Pulse 61   Resp 13   Ht 5' 5.5" (1.664 m)   Wt 286 lb 12.8 oz (130.1 kg)   BMI 47.00 kg/m    Physical Exam Constitutional:      Appearance: She is obese.  Cardiovascular:     Rate and Rhythm: Normal rate and regular rhythm.  Pulmonary:     Effort: Pulmonary effort is normal.     Breath sounds: Normal breath sounds.  Musculoskeletal:     Right lower leg: No edema.     Left lower leg: No edema.  Skin:    General: Skin is warm and dry.     Findings: No rash.  Neurological:     Mental Status: She is alert.  Psychiatric:        Mood and Affect: Mood normal.      Musculoskeletal Exam:  Shoulders full ROM no tenderness or swelling Elbows full ROM no tenderness or swelling Wrists full ROM no tenderness or swelling Fingers full ROM no tenderness or swelling No focal tenderness to palpation around lateral hip on exam, hip range of motion grossly normal and symmetric Knees full ROM no tenderness or swelling no femoral  crepitus present both sides   CDAI Exam: CDAI Score: -- Patient Global: --; Provider Global: -- Swollen: 0 ; Tender: 0  Joint Exam 10/20/2021   No joint exam has been documented for this visit   There is currently no information documented on the homunculus. Go to the Rheumatology activity and complete the homunculus joint exam.  Investigation: No additional findings.  Imaging: No results found.  Recent Labs: Lab Results  Component Value Date   WBC 7.4 10/20/2021   HGB 11.7 10/20/2021   PLT 349 10/20/2021   NA 140 10/20/2021   K 4.3 10/20/2021   CL 104 10/20/2021   CO2 28 10/20/2021   GLUCOSE 108 (H) 10/20/2021   BUN 12 10/20/2021   CREATININE 0.96 10/20/2021   BILITOT 0.3 10/20/2021   ALKPHOS 115 03/20/2021   AST 14 10/20/2021   ALT 20 10/20/2021   PROT 6.8 10/20/2021   ALBUMIN 3.4 (L) 03/20/2021   CALCIUM 9.3 10/20/2021   GFRAA 83 08/24/2019   QFTBGOLDPLUS NEGATIVE 04/21/2021    Speciality Comments: No specialty comments available.  Procedures:  No procedures performed Allergies: Esomeprazole magnesium   Assessment / Plan:     Visit Diagnoses: Seropositive rheumatoid arthritis (HCC) - Plan: Sedimentation rate, methotrexate 2.5 MG tablet  Inflammatory arthritis symptoms appear improved there is complete resolution of the peripheral joint swelling and no tenderness on exam today.  He sedimentation rate for disease activity monitoring.  Plan to continue methotrexate 10 mg p.o. weekly folic acid 1 mg daily.  High risk medication use - Methotrexate TAKE 6 TABLETS BY MOUTH ONCE A WEEK  - Plan: CBC with Differential/Platelet, COMPLETE METABOLIC PANEL WITH GFR  Checking CBC and CMP for methotrexate toxicity monitoring.  Hip pain  The described pain sounds most consistent with some lateral hip pain syndrome possible tendinopathy I do not think this is greater trochanteric bursitis.  Pain does not localize to the groin nor impacted with active and passive hip range  of motion so I doubt this is directly related to her inflammatory arthritis.   Orders: Orders Placed This Encounter  Procedures   Sedimentation rate   CBC with Differential/Platelet   COMPLETE METABOLIC PANEL WITH GFR   Meds ordered this encounter  Medications     methotrexate 2.5 MG tablet    Sig: TAKE 6 TABLETS BY MOUTH ONCE A WEEK CHEMOTHERAPY,  PROTECT  FROM  LIGHT    Dispense:  78 tablet    Refill:  0     Follow-Up Instructions: Return in about 3 months (around 01/20/2022) for RA on MTX f/u 56mo.   CCollier Salina MD  Note - This record has been created using [employer]  Chart creation errors have been sought, but may not always  have been located. Such creation errors do not reflect on  the standard of medical care.

## 2021-10-10 DIAGNOSIS — H40033 Anatomical narrow angle, bilateral: Secondary | ICD-10-CM | POA: Diagnosis not present

## 2021-10-10 DIAGNOSIS — H04123 Dry eye syndrome of bilateral lacrimal glands: Secondary | ICD-10-CM | POA: Diagnosis not present

## 2021-10-17 DIAGNOSIS — G4733 Obstructive sleep apnea (adult) (pediatric): Secondary | ICD-10-CM | POA: Diagnosis not present

## 2021-10-20 ENCOUNTER — Encounter: Payer: Self-pay | Admitting: Internal Medicine

## 2021-10-20 ENCOUNTER — Ambulatory Visit: Payer: BC Managed Care – PPO | Attending: Internal Medicine | Admitting: Internal Medicine

## 2021-10-20 VITALS — BP 105/67 | HR 61 | Resp 13 | Ht 65.5 in | Wt 286.8 lb

## 2021-10-20 DIAGNOSIS — Z79899 Other long term (current) drug therapy: Secondary | ICD-10-CM

## 2021-10-20 DIAGNOSIS — M059 Rheumatoid arthritis with rheumatoid factor, unspecified: Secondary | ICD-10-CM

## 2021-10-20 DIAGNOSIS — M1990 Unspecified osteoarthritis, unspecified site: Secondary | ICD-10-CM | POA: Diagnosis not present

## 2021-10-20 DIAGNOSIS — R109 Unspecified abdominal pain: Secondary | ICD-10-CM | POA: Diagnosis not present

## 2021-10-20 MED ORDER — METHOTREXATE SODIUM 2.5 MG PO TABS: ORAL_TABLET | ORAL | 0 refills | Status: DC

## 2021-10-21 LAB — COMPLETE METABOLIC PANEL WITH GFR
AG Ratio: 1.1 (calc) (ref 1.0–2.5)
ALT: 20 U/L (ref 6–29)
AST: 14 U/L (ref 10–35)
Albumin: 3.5 g/dL — ABNORMAL LOW (ref 3.6–5.1)
Alkaline phosphatase (APISO): 86 U/L (ref 37–153)
BUN: 12 mg/dL (ref 7–25)
CO2: 28 mmol/L (ref 20–32)
Calcium: 9.3 mg/dL (ref 8.6–10.4)
Chloride: 104 mmol/L (ref 98–110)
Creat: 0.96 mg/dL (ref 0.50–1.05)
Globulin: 3.3 g/dL (calc) (ref 1.9–3.7)
Glucose, Bld: 108 mg/dL — ABNORMAL HIGH (ref 65–99)
Potassium: 4.3 mmol/L (ref 3.5–5.3)
Sodium: 140 mmol/L (ref 135–146)
Total Bilirubin: 0.3 mg/dL (ref 0.2–1.2)
Total Protein: 6.8 g/dL (ref 6.1–8.1)
eGFR: 67 mL/min/{1.73_m2} (ref >=60)

## 2021-10-21 LAB — CBC WITH DIFFERENTIAL/PLATELET
Absolute Monocytes: 488 cells/uL (ref 200–950)
Basophils Absolute: 59 cells/uL (ref 0–200)
Basophils Relative: 0.8 %
Eosinophils Absolute: 259 cells/uL (ref 15–500)
Eosinophils Relative: 3.5 %
HCT: 36.1 % (ref 35.0–45.0)
Hemoglobin: 11.7 g/dL (ref 11.7–15.5)
Lymphs Abs: 1991 cells/uL (ref 850–3900)
MCH: 27.9 pg (ref 27.0–33.0)
MCHC: 32.4 g/dL (ref 32.0–36.0)
MCV: 86.2 fL (ref 80.0–100.0)
MPV: 9 fL (ref 7.5–12.5)
Monocytes Relative: 6.6 %
Neutro Abs: 4603 cells/uL (ref 1500–7800)
Neutrophils Relative %: 62.2 %
Platelets: 349 10*3/uL (ref 140–400)
RBC: 4.19 10*6/uL (ref 3.80–5.10)
RDW: 13.8 % (ref 11.0–15.0)
Total Lymphocyte: 26.9 %
WBC: 7.4 10*3/uL (ref 3.8–10.8)

## 2021-10-21 LAB — SEDIMENTATION RATE: Sed Rate: 56 mm/h — ABNORMAL HIGH (ref 0–30)

## 2021-10-21 NOTE — Progress Notes (Signed)
Her sedimentation rate test is partially improving still elevated at 56, hers was previously around 58.  The blood count and metabolic panel look normal no problems to continue her current methotrexate.

## 2021-10-27 ENCOUNTER — Other Ambulatory Visit: Payer: Self-pay | Admitting: Physician Assistant

## 2021-10-27 DIAGNOSIS — L409 Psoriasis, unspecified: Secondary | ICD-10-CM

## 2021-10-29 ENCOUNTER — Other Ambulatory Visit: Payer: Self-pay | Admitting: Cardiology

## 2021-11-26 ENCOUNTER — Other Ambulatory Visit: Payer: Self-pay | Admitting: Cardiology

## 2021-11-27 ENCOUNTER — Ambulatory Visit (INDEPENDENT_AMBULATORY_CARE_PROVIDER_SITE_OTHER): Payer: BC Managed Care – PPO | Admitting: Family

## 2021-11-27 ENCOUNTER — Encounter: Payer: Self-pay | Admitting: Family

## 2021-11-27 DIAGNOSIS — Z1211 Encounter for screening for malignant neoplasm of colon: Secondary | ICD-10-CM

## 2021-11-27 DIAGNOSIS — G4733 Obstructive sleep apnea (adult) (pediatric): Secondary | ICD-10-CM

## 2021-11-27 DIAGNOSIS — E1169 Type 2 diabetes mellitus with other specified complication: Secondary | ICD-10-CM | POA: Diagnosis not present

## 2021-11-27 NOTE — Progress Notes (Signed)
Virtual Visit  Note Due to COVID-19 pandemic this visit was conducted virtually. This visit type was conducted due to national recommendations for restrictions regarding the COVID-19 Pandemic (e.g. social distancing, sheltering in place) in an effort to limit this patient's exposure and mitigate transmission in our community. All issues noted in this document were discussed and addressed.  A physical exam was not performed with this format.  I connected with [name] on 11/27/21 at 2:24 pm  by telephone and verified that I am speaking with the correct person using two identifiers. [name] is currently located at home and no one is currently with her during visit. The provider, Evelina Dun, FNP is located in their office at time of visit.  I discussed the limitations, risks, security and privacy concerns of performing an evaluation and management service by telephone and the availability of in person appointments. I also discussed with the patient that there may be a patient responsible charge related to this service. The patient expressed understanding and agreed to proceed.  Ms. marae, cottrell are scheduled for a virtual visit with your provider today.    Just as we do with appointments in the office, we must obtain your consent to participate.  Your consent will be active for this visit and any virtual visit you may have with one of our providers in the next 365 days.    If you have a MyChart account, I can also send a copy of this consent to you electronically.  All virtual visits are billed to your insurance company just like a traditional visit in the office.  As this is a virtual visit, video technology does not allow for your provider to perform a traditional examination.  This may limit your provider's ability to fully assess your condition.  If your provider identifies any concerns that need to be evaluated in person or the need to arrange testing such as labs, EKG, etc, we  will make arrangements to do so.    Although advances in technology are sophisticated, we cannot ensure that it will always work on either your end or our end.  If the connection with a video visit is poor, we may have to switch to a telephone visit.  With either a video or telephone visit, we are not always able to ensure that we have a secure connection.   I need to obtain your verbal consent now.   Are you willing to proceed with your visit today?   RETHER RISON has provided verbal consent on 11/27/2021 for a virtual visit (video or telephone).   Evelina Dun, [place] 11/27/2021  3:02 PM    History and Present Illness:  Pt calls the office today to discuss her visit from her Neurologists. She had a sleep study and found to have OSA. She was started on a CPAP. Since starting she had increase energy.  Diabetes She presents for her follow-up diabetic visit. She has type 2 diabetes mellitus. There are no hypoglycemic associated symptoms. Pertinent negatives for diabetes include no blurred vision and no foot paresthesias. Symptoms are stable. Pertinent negatives for diabetic complications include no nephropathy or peripheral neuropathy. Risk factors for coronary artery disease include dyslipidemia, diabetes mellitus, hypertension, sedentary lifestyle and post-menopausal. She is following a generally healthy diet. Her dinner blood glucose range is generally 110-130 mg/dl.      Review of Systems  Eyes:  Negative for blurred vision.     Observations/Objective: No SOB or distress noted  Assessment and Plan: 1. OSA (obstructive sleep apnea) Continue to wear CPAP Keep follow up with Neurologists   2. Colon cancer screening - Ambulatory referral to Gastroenterology  3. Type 2 diabetes mellitus with other specified complication, without long-term current use of insulin ([place]) Continue medication  I discussed the assessment and treatment plan with the patient. The patient was provided an  opportunity to ask questions and all were answered. The patient agreed with the plan and demonstrated an understanding of the instructions.   The patient was advised to call back or seek an in-person evaluation if the symptoms worsen or if the condition fails to improve as anticipated.  The above assessment and management plan was discussed with the patient. The patient verbalized understanding of and has agreed to the management plan. Patient is aware to call the clinic if symptoms persist or worsen. Patient is aware when to return to the clinic for a follow-up visit. Patient educated on when it is appropriate to go to the emergency department.   Time call ended:  3:36 pm   I provided 12 minutes of  non face-to-face time during this encounter.    Evelina Dun, FNP

## 2021-11-27 NOTE — Patient Instructions (Signed)
Sleep Apnea Sleep apnea is a condition in which breathing pauses or becomes shallow during sleep. People with sleep apnea usually snore loudly. They may have times when they gasp and stop breathing for 10 seconds or more during sleep. This may happen many times during the night. Sleep apnea disrupts your sleep and keeps your body from getting the rest that it needs. This condition can increase your risk of certain health problems, including: Heart attack. Stroke. Obesity. Type 2 diabetes. Heart failure. Irregular heartbeat. High blood pressure. The goal of treatment is to help you breathe normally again. What are the causes?  The most common cause of sleep apnea is a collapsed or blocked airway. There are three kinds of sleep apnea: Obstructive sleep apnea. This kind is caused by a blocked or collapsed airway. Central sleep apnea. This kind happens when the part of the brain that controls breathing does not send the correct signals to the muscles that control breathing. Mixed sleep apnea. This is a combination of obstructive and central sleep apnea. What increases the risk? You are more likely to develop this condition if you: Are overweight. Smoke. Have a smaller than normal airway. Are older. Are female. Drink alcohol. Take sedatives or tranquilizers. Have a family history of sleep apnea. Have a tongue or tonsils that are larger than normal. What are the signs or symptoms? Symptoms of this condition include: Trouble staying asleep. Loud snoring. Morning headaches. Waking up gasping. Dry mouth or sore throat in the morning. Daytime sleepiness and tiredness. If you have daytime fatigue because of sleep apnea, you may be more likely to have: Trouble concentrating. Forgetfulness. Irritability or mood swings. Personality changes. Feelings of depression. Sexual dysfunction. This may include loss of interest if you are female, or erectile dysfunction if you are female. How is this  diagnosed? This condition may be diagnosed with: A medical history. A physical exam. A series of tests that are done while you are sleeping (sleep study). These tests are usually done in a sleep lab, but they may also be done at home. How is this treated? Treatment for this condition aims to restore normal breathing and to ease symptoms during sleep. It may involve managing health issues that can affect breathing, such as high blood pressure or obesity. Treatment may include: Sleeping on your side. Using a decongestant if you have nasal congestion. Avoiding the use of depressants, including alcohol, sedatives, and narcotics. Losing weight if you are overweight. Making changes to your diet. Quitting smoking. Using a device to open your airway while you sleep, such as: An oral appliance. This is a custom-made mouthpiece that shifts your lower jaw forward. A continuous positive airway pressure (CPAP) device. This device blows air through a mask when you breathe out (exhale). A nasal expiratory positive airway pressure (EPAP) device. This device has valves that you put into each nostril. A bi-level positive airway pressure (BIPAP) device. This device blows air through a mask when you breathe in (inhale) and breathe out (exhale). Having surgery if other treatments do not work. During surgery, excess tissue is removed to create a wider airway. Follow these instructions at home: Lifestyle Make any lifestyle changes that your health care provider recommends. Eat a healthy, well-balanced diet. Take steps to lose weight if you are overweight. Avoid using depressants, including alcohol, sedatives, and narcotics. Do not use any products that contain nicotine or tobacco. These products include cigarettes, chewing tobacco, and vaping devices, such as e-cigarettes. If you need help quitting, ask   your health care provider. General instructions Take over-the-counter and prescription medicines only as told  by your health care provider. If you were given a device to open your airway while you sleep, use it only as told by your health care provider. If you are having surgery, make sure to tell your health care provider you have sleep apnea. You may need to bring your device with you. Keep all follow-up visits. This is important. Contact a health care provider if: The device that you received to open your airway during sleep is uncomfortable or does not seem to be working. Your symptoms do not improve. Your symptoms get worse. Get help right away if: You develop: Chest pain. Shortness of breath. Discomfort in your back, arms, or stomach. You have: Trouble speaking. Weakness on one side of your body. Drooping in your face. These symptoms may represent a serious problem that is an emergency. Do not wait to see if the symptoms will go away. Get medical help right away. Call your local emergency services (911 in the [place] Do not drive yourself to the hospital. Summary Sleep apnea is a condition in which breathing pauses or becomes shallow during sleep. The most common cause is a collapsed or blocked airway. The goal of treatment is to restore normal breathing and to ease symptoms during sleep. This information is not intended to replace advice given to you by your health care provider. Make sure you discuss any questions you have with your health care provider. Document Revised: 10/01/2020 Document Reviewed: 02/01/2020 Elsevier Patient Education  2023 [employer]  

## 2021-11-30 ENCOUNTER — Encounter (INDEPENDENT_AMBULATORY_CARE_PROVIDER_SITE_OTHER): Payer: Self-pay | Admitting: *Deleted

## 2021-12-07 ENCOUNTER — Other Ambulatory Visit: Payer: Self-pay | Admitting: Internal Medicine

## 2021-12-07 DIAGNOSIS — M059 Rheumatoid arthritis with rheumatoid factor, unspecified: Secondary | ICD-10-CM

## 2022-01-01 ENCOUNTER — Other Ambulatory Visit: Payer: Self-pay

## 2022-01-01 ENCOUNTER — Emergency Department (HOSPITAL_COMMUNITY)
Admission: EM | Admit: 2022-01-01 | Discharge: 2022-01-02 | Disposition: A | Discharge status: Home / Self Care | Payer: BC Managed Care – PPO | Attending: Emergency Medicine | Admitting: Emergency Medicine

## 2022-01-01 ENCOUNTER — Encounter (HOSPITAL_COMMUNITY): Payer: Self-pay

## 2022-01-01 DIAGNOSIS — Z7901 Long term (current) use of anticoagulants: Secondary | ICD-10-CM | POA: Diagnosis not present

## 2022-01-01 DIAGNOSIS — M26622 Arthralgia of left temporomandibular joint: Secondary | ICD-10-CM | POA: Diagnosis not present

## 2022-01-01 DIAGNOSIS — R6884 Jaw pain: Secondary | ICD-10-CM | POA: Diagnosis not present

## 2022-01-01 HISTORY — DX: Unspecified atrial fibrillation: I48.91

## 2022-01-01 NOTE — ED Triage Notes (Signed)
Pt arrived from home via POV c/o left jaw pain that began this afternoon. Pt denies injury/trauma. No swelling noted on assessment, but Pt reports pain is worse with eating and talking. Pt denies CP but does endorse Hx of Afib.

## 2022-01-02 MED ORDER — OXYCODONE HCL 5 MG PO TABS
10.0000 mg | ORAL_TABLET | Freq: Once | ORAL | Status: AC
  Administered 2022-01-02: 10 mg via ORAL
  Filled 2022-01-02: qty 2

## 2022-01-02 MED ORDER — PREDNISONE 20 MG PO TABS: ORAL_TABLET | ORAL | 0 refills | Status: DC

## 2022-01-02 MED ORDER — OXYCODONE-ACETAMINOPHEN 5-325 MG PO TABS: 1.0000 | ORAL_TABLET | Freq: Four times a day (QID) | ORAL | 0 refills | Status: DC | PRN

## 2022-01-02 NOTE — ED Provider Notes (Signed)
[facility] EMERGENCY DEPARTMENT Provider Note   CSN: [id number] Arrival date & time: 01/01/22  2306     History  Chief Complaint  Patient presents with   Jaw Pain    Maria Winters is a 61 y.o. female.  Patient presents the ER today with left temporomandibular joint pain.  Patient states it hurts to open her mouth and hurts to talk much.  No fevers.  Started about 10 hours ago has progressively worsened.  Has not tried thing at home for her discomfort.  No dental pain.  No trouble swallowing.  No noted swelling.  No trauma.        Home Medications Prior to Admission medications   Medication Sig Start Date End Date Taking? Authorizing Provider  clobetasol ointment (TEMOVATE) 0.05 % APPLY TOPICALLY TO AFFECTED AREA ONCE DAILY UNTIL GONE 10/28/21  Yes Sheffield, Kelli R, PA-C  dexamethasone 0.5 MG/5ML elixir Take 0.5 mg by mouth 4 (four) times daily. 11/20/19  Yes [provider]  ELIQUIS 5 MG TABS tablet Take 1 tablet by mouth twice daily 11/27/21  Yes Adrian Prows, MD  fluocinonide gel (LIDEX) 0.05 % Apply topically 4 (four) times daily. 11/15/19  Yes [provider]  folic acid (FOLVITE) 1 MG tablet Take 1 tablet by mouth once daily 07/30/21  Yes Rice, Resa Miner, MD  methotrexate 2.5 MG tablet TAKE 6 TABLETS BY MOUTH ONCE A WEEK CHEMOTHERAPY,  PROTECT  FROM  LIGHT 10/20/21  Yes Rice, Resa Miner, MD  metoprolol succinate (TOPROL-XL) 25 MG 24 hr tablet Take 1 tablet (25 mg total) by mouth daily. 09/04/21  Yes Cantwell, Celeste C, PA-C  oxyCODONE-acetaminophen (PERCOCET/ROXICET) 5-325 MG tablet Take 1 tablet by mouth every 6 (six) hours as needed for severe pain. 01/02/22  Yes Eliese Kerwood, Corene Cornea, MD  predniSONE (DELTASONE) 20 MG tablet 3 tabs po daily x 3 days, then 2 tabs x 3 days, then 1.5 tabs x 3 days, then 1 tab x 3 days, then 0.5 tabs x 3 days 01/02/22  Yes Rayce Brahmbhatt, Corene Cornea, MD  acetaminophen (TYLENOL) 325 MG tablet Take 650 mg by mouth every 6 (six) hours as  needed.    [provider]  Polyethylene Glycol 3350 (MIRALAX PO) Take by mouth daily.    [provider]      Allergies    Esomeprazole magnesium    Review of Systems   Review of Systems  Physical Exam Updated Vital Signs BP 127/68 (BP Location: Right Arm)   Pulse 79   Temp 98 F (36.7 C) (Oral)   Resp 19   Ht 5' 5.5" (1.664 m)   Wt 130 kg   SpO2 96%   BMI 46.97 kg/m  Physical Exam Vitals and nursing note reviewed.  Constitutional:      Appearance: She is well-developed.  HENT:     Head: Normocephalic and atraumatic.     Comments: Tenderness to left TMJ.  No other obvious swelling, tenderness, erythema.  No dental abnormalities.    Mouth/Throat:     Mouth: Mucous membranes are moist.     Pharynx: Oropharynx is clear.  Eyes:     Pupils: Pupils are equal, round, and reactive to light.  Cardiovascular:     Rate and Rhythm: Normal rate and regular rhythm.  Pulmonary:     Effort: No respiratory distress.     Breath sounds: No stridor.  Abdominal:     General: Abdomen is flat. There is no distension.  Musculoskeletal:     Cervical  back: Normal range of motion.  Skin:    General: Skin is warm and dry.  Neurological:     Mental Status: She is alert.     ED Results / Procedures / Treatments   Labs (all labs ordered are listed, but only abnormal results are displayed) Labs Reviewed - No data to display  EKG None  Radiology No results found.  Procedures Procedures    Medications Ordered in ED Medications  oxyCODONE (Oxy IR/ROXICODONE) immediate release tablet 10 mg (10 mg Oral Given 01/02/22 0031)    ED Course/ Medical Decision Making/ A&P                           Medical Decision Making Risk Prescription drug management.   It so acute in the presentation I do not know exactly going on.  There is no trauma or physical exam findings suggesting dislocation.  Pain medicine and the pain was almost gone she is able to open her mouth  fully I have low suspicion for space-occupying lesion or deep neck space infection without fevers or evidence of the same.  Patient has multiple allergies and intolerances so use prednisone for anti-inflammatory for now with PCP follow-up to ensure improvement.  I discussed reasons to come back to the ER such as fever, worsening pain, inability to open mouth, swelling, inability to talk or swallow.  Final Clinical Impression(s) / ED Diagnoses Final diagnoses:  Arthralgia of left temporomandibular joint    Rx / DC Orders ED Discharge Orders          Ordered    oxyCODONE-acetaminophen (PERCOCET/ROXICET) 5-325 MG tablet  Every 6 hours PRN        01/02/22 0119    predniSONE (DELTASONE) 20 MG tablet        01/02/22 0120              Ermina Oberman, Corene Cornea, MD 01/02/22 [phone]

## 2022-01-04 MED FILL — Oxycodone w/ Acetaminophen Tab 5-325 MG: ORAL | Qty: 6 | Status: AC

## 2022-01-05 ENCOUNTER — Telehealth: Payer: Self-pay

## 2022-01-05 NOTE — Telephone Encounter (Signed)
Transition Care Management Unsuccessful Follow-up Telephone Call  Date of discharge and from where:  01/02/2022 [facility]  Attempts:  1st Attempt  Reason for unsuccessful TCM follow-up call:  Left voice message

## 2022-01-07 ENCOUNTER — Ambulatory Visit: Payer: BC Managed Care – PPO | Admitting: Family

## 2022-01-07 ENCOUNTER — Other Ambulatory Visit (HOSPITAL_COMMUNITY): Payer: Self-pay | Admitting: Family

## 2022-01-07 ENCOUNTER — Encounter: Payer: Self-pay | Admitting: Family

## 2022-01-07 VITALS — BP 135/80 | HR 54 | Temp 97.3°F | Ht 65.0 in | Wt 288.2 lb

## 2022-01-07 DIAGNOSIS — Z0001 Encounter for general adult medical examination with abnormal findings: Secondary | ICD-10-CM

## 2022-01-07 DIAGNOSIS — Z23 Encounter for immunization: Secondary | ICD-10-CM | POA: Diagnosis not present

## 2022-01-07 DIAGNOSIS — G4733 Obstructive sleep apnea (adult) (pediatric): Secondary | ICD-10-CM

## 2022-01-07 DIAGNOSIS — M059 Rheumatoid arthritis with rheumatoid factor, unspecified: Secondary | ICD-10-CM

## 2022-01-07 DIAGNOSIS — K219 Gastro-esophageal reflux disease without esophagitis: Secondary | ICD-10-CM | POA: Diagnosis not present

## 2022-01-07 DIAGNOSIS — E1169 Type 2 diabetes mellitus with other specified complication: Secondary | ICD-10-CM

## 2022-01-07 DIAGNOSIS — E785 Hyperlipidemia, unspecified: Secondary | ICD-10-CM

## 2022-01-07 DIAGNOSIS — Z0000 Encounter for general adult medical examination without abnormal findings: Secondary | ICD-10-CM

## 2022-01-07 DIAGNOSIS — E559 Vitamin D deficiency, unspecified: Secondary | ICD-10-CM

## 2022-01-07 DIAGNOSIS — K5900 Constipation, unspecified: Secondary | ICD-10-CM

## 2022-01-07 DIAGNOSIS — Z6841 Body Mass Index (BMI) 40.0 and over, adult: Secondary | ICD-10-CM

## 2022-01-07 DIAGNOSIS — I480 Paroxysmal atrial fibrillation: Secondary | ICD-10-CM

## 2022-01-07 DIAGNOSIS — Z1211 Encounter for screening for malignant neoplasm of colon: Secondary | ICD-10-CM

## 2022-01-07 DIAGNOSIS — Z09 Encounter for follow-up examination after completed treatment for conditions other than malignant neoplasm: Secondary | ICD-10-CM

## 2022-01-07 DIAGNOSIS — F411 Generalized anxiety disorder: Secondary | ICD-10-CM

## 2022-01-07 DIAGNOSIS — Z79899 Other long term (current) drug therapy: Secondary | ICD-10-CM

## 2022-01-07 DIAGNOSIS — Z1231 Encounter for screening mammogram for malignant neoplasm of breast: Secondary | ICD-10-CM

## 2022-01-07 LAB — CBC WITH DIFFERENTIAL/PLATELET
Basophils Absolute: 0.1 10*3/uL (ref 0.0–0.2)
Basos: 1 %
EOS (ABSOLUTE): 0.2 10*3/uL (ref 0.0–0.4)
Eos: 1 %
Hematocrit: 37.3 % (ref 34.0–46.6)
Hemoglobin: 12.6 g/dL (ref 11.1–15.9)
Immature Grans (Abs): 0.1 10*3/uL (ref 0.0–0.1)
Immature Granulocytes: 1 %
Lymphocytes Absolute: 4.9 10*3/uL — ABNORMAL HIGH (ref 0.7–3.1)
Lymphs: 36 %
MCH: 28.4 pg (ref 26.6–33.0)
MCHC: 33.8 g/dL (ref 31.5–35.7)
MCV: 84 fL (ref 79–97)
Monocytes Absolute: 0.8 10*3/uL (ref 0.1–0.9)
Monocytes: 6 %
Neutrophils Absolute: 7.3 10*3/uL — ABNORMAL HIGH (ref 1.4–7.0)
Neutrophils: 55 %
Platelets: 380 10*3/uL (ref 150–450)
RBC: 4.44 x10E6/uL (ref 3.77–5.28)
RDW: 13.1 % (ref 11.7–15.4)
WBC: 13.4 10*3/uL — ABNORMAL HIGH (ref 3.4–10.8)

## 2022-01-07 LAB — CMP14+EGFR
ALT: 21 IU/L (ref 0–32)
AST: 7 IU/L (ref 0–40)
Albumin/Globulin Ratio: 1.2 (ref 1.2–2.2)
Albumin: 3.8 g/dL — ABNORMAL LOW (ref 3.9–4.9)
Alkaline Phosphatase: 97 IU/L (ref 44–121)
BUN/Creatinine Ratio: 16 (ref 12–28)
BUN: 12 mg/dL (ref 8–27)
Bilirubin Total: 0.3 mg/dL (ref 0.0–1.2)
CO2: 28 mmol/L (ref 20–29)
Calcium: 9.4 mg/dL (ref 8.7–10.3)
Chloride: 100 mmol/L (ref 96–106)
Creatinine, Ser: 0.77 mg/dL (ref 0.57–1.00)
Globulin, Total: 3.2 g/dL (ref 1.5–4.5)
Glucose: 95 mg/dL (ref 70–99)
Potassium: 3.7 mmol/L (ref 3.5–5.2)
Sodium: 142 mmol/L (ref 134–144)
Total Protein: 7 g/dL (ref 6.0–8.5)
eGFR: 87 mL/min/{1.73_m2} (ref >59)

## 2022-01-07 LAB — LIPID PANEL
Chol/HDL Ratio: 1.8 ratio (ref 0.0–4.4)
Cholesterol, Total: 170 mg/dL (ref 100–199)
HDL: 92 mg/dL (ref >39)
LDL Chol Calc (NIH): 63 mg/dL (ref 0–99)
Triglycerides: 79 mg/dL (ref 0–149)
VLDL Cholesterol Cal: 15 mg/dL (ref 5–40)

## 2022-01-07 LAB — BAYER DCA HB A1C WAIVED: HB A1C (BAYER DCA - WAIVED): 7.3 % — ABNORMAL HIGH (ref 4.8–5.6)

## 2022-01-07 NOTE — Patient Instructions (Signed)

## 2022-01-07 NOTE — Progress Notes (Signed)
Subjective:    Patient ID: Maria Winters, female    DOB: Mar 02, 1961, 61 y.o.   MRN: [id number]  Chief Complaint  Patient presents with   Medical Management of Chronic Issues   Hospitalization Follow-up   Pt calls the office today for CPE and  chronic follow up.  She is following a Cardiologists for A Fib, and intermittent palpitations. She is taking Eliquis BID.   She is followed by Rheumatologists every 3 months for RA.    She has OSA and uses CPAP nightly.   She also went to the ED on 01/01/22 with left jaw pain and diagnosed with TMJ. She was given prednisone. States this pain has improved.  Gastroesophageal Reflux She complains of belching and heartburn. This is a chronic problem. The current episode started more than 1 year ago. The problem occurs occasionally. The problem has been waxing and waning. Risk factors include obesity. She has tried a PPI for the symptoms. The treatment provided moderate relief.  Diabetes She presents for her follow-up diabetic visit. She has type 2 diabetes mellitus. Pertinent negatives for hypoglycemia include no nervousness/anxiousness. Pertinent negatives for diabetes include no blurred vision and no foot paresthesias. Symptoms are stable. Pertinent negatives for diabetic complications include no peripheral neuropathy. Risk factors for coronary artery disease include dyslipidemia, diabetes mellitus, hypertension, sedentary lifestyle, post-menopausal and obesity. She is following a generally unhealthy diet. Her overall blood glucose range is 110-130 mg/dl. Eye exam is current.  Arthritis Presents for follow-up visit. She complains of pain and stiffness. The symptoms have been stable. Affected locations include the right knee, left knee, left MCP, right MCP, left shoulder, right shoulder, right foot and left foot. Her pain is at a severity of 2/10.  Constipation This is a chronic problem. The current episode started more than 1 year ago. The problem  has been waxing and waning since onset. Her stool frequency is 1 time per day. She has tried diet changes for the symptoms. The treatment provided moderate relief.  Anxiety Presents for follow-up visit. Patient reports no depressed mood, excessive worry, irritability or nervous/anxious behavior. Symptoms occur rarely. The severity of symptoms is mild.        Review of Systems  Constitutional:  Negative for irritability.  Eyes:  Negative for blurred vision.  Gastrointestinal:  Positive for constipation and heartburn.  Musculoskeletal:  Positive for arthritis and stiffness.  Psychiatric/Behavioral:  The patient is not nervous/anxious.   All other systems reviewed and are negative.      Family History  Problem Relation Age of Onset   Diabetes Mother    Hypertension Father    Healthy Sister    Hypertension Sister    Hypertension Brother    Gout Brother    Heart attack Brother    Breast cancer Sister 37       lumpectomy   Sickle cell trait Brother    Diabetes Maternal Grandmother        with 2 BKA   Stroke Maternal Grandmother    Breast cancer Maternal Aunt    Social History   Socioeconomic History   Marital status: Single    Spouse name: Not on file   Number of children: Not on file   Years of education: Not on file   Highest education level: Not on file  Occupational History   Not on file  Tobacco Use   Smoking status: Former    Packs/day: 0.25    Years: 10.00    Total pack  years: 2.50    Types: Cigarettes    Quit date: 06/20/1992    Years since quitting: 29.5   Smokeless tobacco: Never  Vaping Use   Vaping Use: Never used  Substance and Sexual Activity   Alcohol use: Never   Drug use: No   Sexual activity: Never    Birth control/protection: Abstinence  Other Topics Concern   Not on file  Social History Narrative   Not on file   Social Determinants of Health   Financial Resource Strain: Not on file  Food Insecurity: Not on file  Transportation Needs:  Not on file  Physical Activity: Not on file  Stress: Not on file  Social Connections: Not on file    Objective:   Physical Exam Vitals reviewed.  Constitutional:      General: She is not in acute distress.    Appearance: She is well-developed. She is obese.  HENT:     Head: Normocephalic and atraumatic.     Right Ear: Tympanic membrane normal.     Left Ear: Tympanic membrane normal.  Eyes:     Pupils: Pupils are equal, round, and reactive to light.  Neck:     Thyroid: No thyromegaly.  Cardiovascular:     Rate and Rhythm: Normal rate and regular rhythm.     Heart sounds: Normal heart sounds. No murmur heard. Pulmonary:     Effort: Pulmonary effort is normal. No respiratory distress.     Breath sounds: Normal breath sounds. No wheezing.  Abdominal:     General: Bowel sounds are normal. There is no distension.     Palpations: Abdomen is soft.     Tenderness: There is no abdominal tenderness.  Musculoskeletal:        General: No tenderness. Normal range of motion.     Cervical back: Normal range of motion and neck supple.  Skin:    General: Skin is warm and dry.  Neurological:     Mental Status: She is alert and oriented to person, place, and time.     Cranial Nerves: No cranial nerve deficit.     Deep Tendon Reflexes: Reflexes are normal and symmetric.  Psychiatric:        Behavior: Behavior normal.        Thought Content: Thought content normal.        Judgment: Judgment normal.       BP 135/80   Pulse (!) 54   Temp (!) 97.3 F (36.3 C) (Temporal)   Ht _0  (1.651 m)   Wt 288 lb 3.2 oz (130.7 kg)   BMI 47.96 kg/m      Assessment & Plan:  Maria Winters comes in today with chief complaint of Medical Management of Chronic Issues and Hospitalization Follow-up   Diagnosis and orders addressed:  1. Type 2 diabetes mellitus with other specified complication, without long-term current use of insulin (HCC) - CMP14+EGFR - Bayer DCA Hb A1c Waived -  Microalbumin / creatinine urine ratio  2. Hyperlipidemia associated with type 2 diabetes mellitus ([place]) - CBC with Differential/Platelet - Lipid panel  3. Gastroesophageal reflux disease without esophagitis  4. Morbid obesity ([place])  5. Anxiety state  6. Constipation, unspecified constipation type  7. Seropositive rheumatoid arthritis ([place])  8. Vitamin D deficiency - VITAMIN D 25 Hydroxy (Vit-D Deficiency, Fractures)  9. Morbid obesity with body mass index of 45.0-49.9 in adult ([place])  10. Paroxysmal atrial fibrillation (HCC)  11. OSA (obstructive sleep apnea)  12. High risk  medication use  13. Annual physical exam - TSH - VITAMIN D 25 Hydroxy (Vit-D Deficiency, Fractures)  14. Hospital discharge follow-up  15. Colon cancer screening  - Ambulatory referral to Gastroenterology   Labs pending Health Maintenance reviewed Diet and exercise encouraged  Follow up plan: 4 months    Evelina Dun, FNP

## 2022-01-07 NOTE — Telephone Encounter (Signed)
Patient had office visit with [facility] today 01/07/22

## 2022-01-08 LAB — MICROALBUMIN / CREATININE URINE RATIO
Creatinine, Urine: 207.7 mg/dL
Microalb/Creat Ratio: 3 mg/g creat (ref 0–29)
Microalbumin, Urine: 7.1 ug/mL

## 2022-01-09 LAB — TSH: TSH: 1.13 u[IU]/mL (ref 0.450–4.500)

## 2022-01-09 LAB — SPECIMEN STATUS REPORT

## 2022-01-09 LAB — VITAMIN D 25 HYDROXY (VIT D DEFICIENCY, FRACTURES): Vit D, 25-Hydroxy: 34.7 ng/mL (ref 30.0–100.0)

## 2022-01-12 NOTE — Progress Notes (Signed)
Office Visit Note  Patient: Maria Winters             Date of Birth: [birth year]           MRN: [id number]             PCP: Sharion Balloon, FNP Referring: Sharion Balloon, FNP Visit Date: 01/25/2022   Subjective:  Follow-up (Inflammation)   History of Present Illness: Maria Winters is a 61 y.o. female here for follow up for seropositive RA  on MTX 15 mg PO weekly and folic acid 1 mg daily.  She has been mostly doing well since her last visit without significant daily peripheral joint pains or swelling.  Does get pain in her knees somewhat limiting for prolonged walking standing and low back pains in a fixed seated position.  She is noticing pain in her feet on both sides most often after driving.  Had some episodic increase in pain at her hands and elbows improving on their own within about 2 weeks.  Also recently went to the emergency department a few weeks ago due to onset of left-sided jaw pain.  Evaluation was reassuring against cardiac etiology thought to be temporomandibular joint pain.  She completed a steroid taper and low-dose hydrocodone symptoms have resolved.  Previous HPI 10/20/2021 Maria Winters is a 61 y.o. female here for follow up for seropositive RA on methotrexate 15 mg p.o. weekly.  She seen a sizable improvement in much of her joint symptoms and has not experienced any trouble taking the medication.  She is having some pain in the right hip most often occurred when sitting in 1 position for prolonged time usually about 30 minutes or longer provokes this.  Otherwise joint problems are mostly doing better and she is not seeing any ongoing swelling changes.   Previous HPI  06/22/2021 Maria Winters is a 61 y.o. female here for follow up for seropositive RA after starting methotrexate 15 mg PO weekly. Also increase vitamin D supplementation to 5000 units daily. She misunderstood instructions so started taking methotrexate only 1 tablet weekly for 2 weeks then  increased to 6 tablets as planned for the past 2 weeks. She has some diarrhea with the medication but not severe or lasting continuously. She had increased in bilateral heel pain worst when getting up to walk initially, the left side has improved but right side ongoing symptoms. Also now for about 3 days has pain in her left side around the flank or middle and low back. This radiates around to the front. No dysuria, frequency, or hematuria reported. She does not recall any injury. No history of kidney stones or kidney infections.   Previous HPI 05/07/21 Maria Winters is a 61 y.o. female here for follow up for new seropositive RA labs at initial visit negative screening for hepatitis and TB her CCP is highly positive and vitamin D low at 15. Xrays showed mild osteoarthritis in the feet otherwise unremarkable. She continues having significant joint pain symptoms affected hands and feet and fatigue. She notices more difficulty performing tasks at work such as pulling drawer handles or turning door knobs with pain or poor grip strength.   Previous HPI 04/21/21 Maria Winters is a 61 y.o. female here for evaluation of joint pain in multiple areas with elevated RF and sed rate. She reports joint pain starting since about a month ago. Pain is affecting many areas including shoulders, elbows, wrists, fingers, hips, knees, and ankles.  She has some night time awakenings due to hand pain. Decreased grip strength is affecting her daily activities including typing and driving. Swelling is most apparent in her hands and on the top of her feet around the base of the toes. She is stiff for about 3 hours each morning before getting some improvement in her mobility. She is not able to take NSAIDs due to anticoagulation for Afib she takes tylenol which helps but has to repeat this about every 8 hours for symptoms returning.   Labs reviewed 03/2021 RF 494 ESR 70   Review of Systems  Constitutional:  Negative for  fatigue.  HENT:  Negative for mouth sores and mouth dryness.   Eyes:  Negative for dryness.  Respiratory:  Negative for shortness of breath.   Cardiovascular:  Negative for chest pain and palpitations.  Gastrointestinal:  Negative for blood in stool, constipation and diarrhea.  Endocrine: Negative for increased urination.  Genitourinary:  Negative for involuntary urination.  Musculoskeletal:  Positive for joint pain and joint pain. Negative for gait problem, joint swelling, myalgias, muscle weakness, morning stiffness, muscle tenderness and myalgias.  Skin:  Negative for color change, rash, hair loss and sensitivity to sunlight.  Allergic/Immunologic: Negative for susceptible to infections.  Neurological:  Negative for dizziness and headaches.  Hematological:  Negative for swollen glands.  Psychiatric/Behavioral:  Negative for depressed mood and sleep disturbance. The patient is not nervous/anxious.     PMFS History:  Patient Active Problem List   Diagnosis Date Noted   OSA (obstructive sleep apnea) 11/27/2021   Morbid obesity with body mass index of 45.0-49.9 in adult [facility]) 05/11/2021   Paroxysmal atrial fibrillation ([place]) 05/11/2021   Intermittent palpitations 05/11/2021   Excessive daytime sleepiness 05/11/2021   At risk for central sleep apnea 05/11/2021   Seropositive rheumatoid arthritis ([place]) 04/21/2021   Vitamin D deficiency 04/21/2021   High risk medication use 04/21/2021   Myalgia 03/20/2021   Vertigo 07/23/2019   Constipation 07/23/2019   Anxiety state 03/18/2016   Migraine equivalent 03/18/2016   Type 2 diabetes mellitus with other specified complication ([place]) 16/12/9602   S/P lap cholecystectomy July 2014 09/14/2012   GERD (gastroesophageal reflux disease) 04/28/2012    Past Medical History:  Diagnosis Date   Abnormal Pap smear of cervix    in her 20's-- hx of conization/LEEP procedure-normal paps since   Abnormal uterine bleeding    Anemia    ~2005    Arthritis    Atrial fibrillation (HCC)    Chest pain    Fibroid    GERD (gastroesophageal reflux disease)    Helicobacter pylori (H. pylori)    Psoriasis    Reflux    STD (sexually transmitted disease) 1994   Tx'd for Chlamydia   TMJ (temporomandibular joint syndrome)    Vision abnormalities    tunnel vision    Family History  Problem Relation Age of Onset   Diabetes Mother    Hypertension Father    Healthy Sister    Hypertension Sister    Hypertension Brother    Gout Brother    Heart attack Brother    Breast cancer Sister 50       lumpectomy   Sickle cell trait Brother    Diabetes Maternal Grandmother        with 2 BKA   Stroke Maternal Grandmother    Breast cancer Maternal Aunt    Past Surgical History:  Procedure Laterality Date   cervix dilated     CHOLECYSTECTOMY  N/A 09/13/2012   Procedure: LAPAROSCOPIC CHOLECYSTECTOMY WITH INTRAOPERATIVE CHOLANGIOGRAM;  Surgeon: Pedro Earls, MD;  Location: [facility] ORS;  Service: General;  Laterality: N/A;   DILATION AND CURETTAGE OF UTERUS     DILATION AND CURETTAGE, DIAGNOSTIC / [place]   with Conization   ESOPHAGOGASTRODUODENOSCOPY N/A 05/11/2012   Procedure: ESOPHAGOGASTRODUODENOSCOPY (EGD);  Surgeon: Rogene Houston, MD;  Location: [facility] ENDO SUITE;  Service: Endoscopy;  Laterality: N/A;  125   LEEP     Social History   Social History Narrative   Not on file   Immunization History  Administered Date(s) Administered   Influenza Split 04/19/2012   Influenza,inj,Quad PF,6+ Mos 01/24/2015, 12/23/2017, 12/07/2019, 03/20/2021, 01/07/2022   PFIZER(Purple Top)SARS-COV-2 Vaccination 06/16/2019, 07/07/2019, 01/26/2020   Pneumococcal Conjugate-13 08/31/2016   Pneumococcal Polysaccharide-23 12/07/2019   Tdap 08/31/2016     Objective: Vital Signs: BP 128/76 (BP Location: Left Arm, Patient Position: Sitting, Cuff Size: Large)   Pulse 85   Resp 16   Ht 5' 5.5" (1.664 m)   Wt 292 lb (132.5 kg)   BMI 47.85 kg/m     Physical Exam Cardiovascular:     Rate and Rhythm: Normal rate and regular rhythm.  Pulmonary:     Effort: Pulmonary effort is normal.     Breath sounds: Normal breath sounds.  Musculoskeletal:     Right lower leg: No edema.     Left lower leg: No edema.  Skin:    General: Skin is warm and dry.     Findings: No rash.  Neurological:     Mental Status: She is alert.  Psychiatric:        Mood and Affect: Mood normal.      Musculoskeletal Exam:  Shoulders full ROM no tenderness or swelling, left shoulder some pain with external rotation while in abduction Elbows full ROM no tenderness or swelling Wrists full ROM no tenderness or swelling Fingers full ROM no tenderness or swelling Knees full ROM no tenderness or swelling   CDAI Exam: CDAI Score: 4  Patient Global: 30 mm; Provider Global: 10 mm Swollen: 0 ; Tender: 0  Joint Exam 01/25/2022   All documented joints were normal     Investigation: No additional findings.  Imaging: MM 3D SCREEN BREAST BILATERAL  Result Date: 01/20/2022 CLINICAL DATA:  Screening. EXAM: DIGITAL SCREENING BILATERAL MAMMOGRAM WITH TOMOSYNTHESIS AND CAD TECHNIQUE: Bilateral screening digital craniocaudal and mediolateral oblique mammograms were obtained. Bilateral screening digital breast tomosynthesis was performed. The images were evaluated with computer-aided detection. COMPARISON:  Previous exam(s). ACR Breast Density Category a: The breast tissue is almost entirely fatty. FINDINGS: There are no findings suspicious for malignancy. IMPRESSION: No mammographic evidence of malignancy. A result letter of this screening mammogram will be mailed directly to the patient. RECOMMENDATION: Screening mammogram in one year. (Code:SM-B-01Y) BI-RADS CATEGORY  1: Negative. Electronically Signed   By: Marin Olp M.D.   On: 01/20/2022 13:56    Recent Labs: Lab Results  Component Value Date   WBC 13.4 (H) 01/07/2022   HGB 12.6 01/07/2022   PLT 380  01/07/2022   NA 142 01/07/2022   K 3.7 01/07/2022   CL 100 01/07/2022   CO2 28 01/07/2022   GLUCOSE 95 01/07/2022   BUN 12 01/07/2022   CREATININE 0.77 01/07/2022   BILITOT 0.3 01/07/2022   ALKPHOS 97 01/07/2022   AST 7 01/07/2022   ALT 21 01/07/2022   PROT 7.0 01/07/2022   ALBUMIN 3.8 (L) 01/07/2022   CALCIUM 9.4  01/07/2022   GFRAA 83 08/24/2019   QFTBGOLDPLUS NEGATIVE 04/21/2021    Speciality Comments: No specialty comments available.  Procedures:  No procedures performed Allergies: Esomeprazole magnesium   Assessment / Plan:     Visit Diagnoses: Seropositive rheumatoid arthritis ([place]) - Plan: Sedimentation rate, C-reactive protein  Ongoing joint pains could be related to underlying osteoarthritis but also suspicious for some inflammation breaking through.  Rechecking sedimentation rate and CRP she never had normalization of inflammatory markers after starting methotrexate at our previous visit.  Of note could be affected by recent steroid course that she completed within the past week or 2.  If labs are okay would recommend trying to increase methotrexate to 20 mg p.o. weekly split dosing in the folic acid 1 mg daily.  High risk medication use - Methotrexate TAKE 6 TABLETS BY MOUTH ONCE A WEEK - Plan: CBC with Differential/Platelet, COMPLETE METABOLIC PANEL WITH GFR  Checking CBC and CMP for methotrexate toxicity monitoring.  No medication intolerance at current 50 mg weekly dose.   Orders: Orders Placed This Encounter  Procedures   Sedimentation rate   CBC with Differential/Platelet   COMPLETE METABOLIC PANEL WITH GFR   C-reactive protein   No orders of the defined types were placed in this encounter.    Follow-Up Instructions: Return in about 3 months (around 04/27/2022) for RA on MTx f/u 73mo.   CCollier Salina MD  Note - This record has been created using [employer]  Chart creation errors have been sought, but may not always  have been located.  Such creation errors do not reflect on  the standard of medical care.

## 2022-01-14 ENCOUNTER — Ambulatory Visit: Payer: BC Managed Care – PPO | Admitting: Family

## 2022-01-15 DIAGNOSIS — R0602 Shortness of breath: Secondary | ICD-10-CM | POA: Diagnosis not present

## 2022-01-15 DIAGNOSIS — J029 Acute pharyngitis, unspecified: Secondary | ICD-10-CM | POA: Diagnosis not present

## 2022-01-18 ENCOUNTER — Ambulatory Visit (HOSPITAL_COMMUNITY)
Admission: RE | Admit: 2022-01-18 | Discharge: 2022-01-18 | Disposition: A | Discharge status: Home / Self Care | Payer: BC Managed Care – PPO | Source: Ambulatory Visit | Attending: Family | Admitting: Family

## 2022-01-18 DIAGNOSIS — Z1231 Encounter for screening mammogram for malignant neoplasm of breast: Secondary | ICD-10-CM | POA: Insufficient documentation

## 2022-01-20 ENCOUNTER — Ambulatory Visit: Payer: BC Managed Care – PPO | Admitting: Internal Medicine

## 2022-01-24 ENCOUNTER — Encounter (INDEPENDENT_AMBULATORY_CARE_PROVIDER_SITE_OTHER): Payer: Self-pay | Admitting: Gastroenterology

## 2022-01-25 ENCOUNTER — Ambulatory Visit: Payer: BC Managed Care – PPO | Attending: Internal Medicine | Admitting: Internal Medicine

## 2022-01-25 ENCOUNTER — Encounter: Payer: Self-pay | Admitting: Internal Medicine

## 2022-01-25 VITALS — BP 128/76 | HR 85 | Resp 16 | Ht 65.5 in | Wt 292.0 lb

## 2022-01-25 DIAGNOSIS — M059 Rheumatoid arthritis with rheumatoid factor, unspecified: Secondary | ICD-10-CM | POA: Diagnosis not present

## 2022-01-25 DIAGNOSIS — Z79899 Other long term (current) drug therapy: Secondary | ICD-10-CM

## 2022-01-25 DIAGNOSIS — M25551 Pain in right hip: Secondary | ICD-10-CM

## 2022-01-25 DIAGNOSIS — M1990 Unspecified osteoarthritis, unspecified site: Secondary | ICD-10-CM

## 2022-01-26 ENCOUNTER — Other Ambulatory Visit: Payer: Self-pay | Admitting: *Deleted

## 2022-01-26 ENCOUNTER — Other Ambulatory Visit: Payer: Self-pay | Admitting: Cardiology

## 2022-01-26 DIAGNOSIS — M059 Rheumatoid arthritis with rheumatoid factor, unspecified: Secondary | ICD-10-CM

## 2022-01-26 LAB — CBC WITH DIFFERENTIAL/PLATELET
Absolute Monocytes: 380 cells/uL (ref 200–950)
Basophils Absolute: 53 cells/uL (ref 0–200)
Basophils Relative: 0.7 %
Eosinophils Absolute: 251 cells/uL (ref 15–500)
Eosinophils Relative: 3.3 %
HCT: 36.8 % (ref 35.0–45.0)
Hemoglobin: 11.9 g/dL (ref 11.7–15.5)
Lymphs Abs: 1619 cells/uL (ref 850–3900)
MCH: 27.8 pg (ref 27.0–33.0)
MCHC: 32.3 g/dL (ref 32.0–36.0)
MCV: 86 fL (ref 80.0–100.0)
MPV: 9.2 fL (ref 7.5–12.5)
Monocytes Relative: 5 %
Neutro Abs: 5297 cells/uL (ref 1500–7800)
Neutrophils Relative %: 69.7 %
Platelets: 319 10*3/uL (ref 140–400)
RBC: 4.28 10*6/uL (ref 3.80–5.10)
RDW: 13.5 % (ref 11.0–15.0)
Total Lymphocyte: 21.3 %
WBC: 7.6 10*3/uL (ref 3.8–10.8)

## 2022-01-26 LAB — COMPLETE METABOLIC PANEL WITH GFR
AG Ratio: 1.2 (calc) (ref 1.0–2.5)
ALT: 22 U/L (ref 6–29)
AST: 12 U/L (ref 10–35)
Albumin: 3.5 g/dL — ABNORMAL LOW (ref 3.6–5.1)
Alkaline phosphatase (APISO): 89 U/L (ref 37–153)
BUN: 8 mg/dL (ref 7–25)
CO2: 29 mmol/L (ref 20–32)
Calcium: 8.8 mg/dL (ref 8.6–10.4)
Chloride: 104 mmol/L (ref 98–110)
Creat: 0.72 mg/dL (ref 0.50–1.05)
Globulin: 3 g/dL (calc) (ref 1.9–3.7)
Glucose, Bld: 230 mg/dL — ABNORMAL HIGH (ref 65–99)
Potassium: 4.1 mmol/L (ref 3.5–5.3)
Sodium: 140 mmol/L (ref 135–146)
Total Bilirubin: 0.3 mg/dL (ref 0.2–1.2)
Total Protein: 6.5 g/dL (ref 6.1–8.1)
eGFR: 94 mL/min/{1.73_m2} (ref >=60)

## 2022-01-26 LAB — SEDIMENTATION RATE: Sed Rate: 65 mm/h — ABNORMAL HIGH (ref 0–30)

## 2022-01-26 LAB — C-REACTIVE PROTEIN: CRP: 34.4 mg/L — ABNORMAL HIGH (ref <8.0)

## 2022-01-26 MED ORDER — METHOTREXATE SODIUM 2.5 MG PO TABS: 20.0000 mg | ORAL_TABLET | ORAL | 0 refills | Status: DC

## 2022-01-26 NOTE — Telephone Encounter (Signed)
Received fax form pharmacy regarding MTX. Prescription received had 2 sets of directions.   Per Lab note She can try increasing the methotrexate dose as discussed to 8 tablets weekly. She should take these as 2 split up doses of 4 on the same day of the week for better effectiveness.

## 2022-01-26 NOTE — Addendum Note (Signed)
Addended by: Collier Salina on: 01/26/2022 01:10 PM   Modules accepted: Orders

## 2022-01-26 NOTE — Progress Notes (Signed)
Lab results show no problems from the methotrexate but her inflammatory markers are elevated sedimentation rate is up to 65 from 56 and CRP is 34.4 without previous comparison. This suggests RA is not under good control.   She can try increasing the methotrexate dose as discussed to 8 tablets weekly. She should take these as 2 split up doses of 4 on the same day of the week for better effectiveness.

## 2022-01-27 MED ORDER — METHOTREXATE SODIUM 2.5 MG PO TABS: 20.0000 mg | ORAL_TABLET | ORAL | 0 refills | Status: DC

## 2022-02-04 ENCOUNTER — Telehealth (INDEPENDENT_AMBULATORY_CARE_PROVIDER_SITE_OTHER): Payer: Self-pay | Admitting: *Deleted

## 2022-02-04 NOTE — Telephone Encounter (Signed)
Referring MD/PCP: Evelina Dun, FNP  Procedure: Colonoscopy  Has patient had this procedure before?  11/02/10  If so, when, by whom and where?    Is there a family history of colon cancer?  no  Who?  What age when diagnosed?    Is patient diabetic? If yes, Type 1 or Type 2? Yes      Does patient have prosthetic heart valve or mechanical valve?  no  Do you have a pacemaker/defibrillator?  no  Has patient ever had endocarditis/atrial fibrillation? yes  Does patient use oxygen? no  Has patient had joint replacement within last 12 months?  no  Is patient constipated or do they take laxatives? no  Does patient have a history of alcohol/drug use?  no  Have you had a stroke/heart attack last 6 mths? no  Do you take medicine for weight loss?  no  For female patients,: have you had a hysterectomy no                      are you post menopausal no                      do you still have your menstrual cycle no  Is patient on blood thinner such as Coumadin, Plavix and/or Aspirin? Eliquis '5mg'$  BID by Dr. Einar Gip  Medications:  Current Outpatient Medications on File Prior to Visit  Medication Sig Dispense Refill   acetaminophen (TYLENOL) 325 MG tablet Take 650 mg by mouth every 6 (six) hours as needed.     albuterol (VENTOLIN HFA) 108 (90 Base) MCG/ACT inhaler Inhale into the lungs.     clobetasol ointment (TEMOVATE) 0.05 % APPLY TOPICALLY TO AFFECTED AREA ONCE DAILY UNTIL GONE 15 g 0   dexamethasone 0.5 MG/5ML elixir Take 0.5 mg by mouth 4 (four) times daily.     ELIQUIS 5 MG TABS tablet Take 1 tablet by mouth twice daily 60 tablet 0   fluocinonide gel (LIDEX) 0.05 % Apply topically 4 (four) times daily.     folic acid (FOLVITE) 1 MG tablet Take 1 tablet by mouth once daily 90 tablet 3   methotrexate (RHEUMATREX) 2.5 MG tablet Take 8 tablets (20 mg total) by mouth once a week. CHEMOTHERAPY,  PROTECT  FROM  LIGHT 104 tablet 0   metoprolol succinate (TOPROL-XL) 25 MG 24 hr tablet Take 1  tablet (25 mg total) by mouth daily. 90 tablet 3   Polyethylene Glycol 3350 (MIRALAX PO) Take by mouth daily.     No current facility-administered medications on file prior to visit.     Allergies:  Allergies  Allergen Reactions   Esomeprazole Magnesium     Patient states that the Nexium caused tremors

## 2022-02-04 NOTE — Telephone Encounter (Signed)
Room 3 

## 2022-02-05 NOTE — Telephone Encounter (Signed)
    02/05/22  [name] 09-Jul-1960  What type of surgery is being performed? Colonoscopy  When is surgery scheduled? TBD  What type of clearance is required (medical or pharmacy to hold medication or both? Medication clearance  Are there any medications that need to be held prior to surgery and how long? Eliquis x 2 days  Name of physician performing surgery?  Dr. Maylon Peppers [facility] at [facility] Phone: [phone] Fax: [phone]  Anethesia type (none, local, MAC, general)? MAC

## 2022-02-06 NOTE — Telephone Encounter (Signed)
Patient can be taken up for the upcoming endoscopic procedure with low risk.  She has a very low chads vascular score of only 2.0, perfectly fine to hold anticoagulation for 2 days and restart same day if no biopsy, otherwise restart in 3 to 5 days.  Thank you

## 2022-02-08 NOTE — Telephone Encounter (Signed)
Thanks Mindy, please inform the patient about Eliquis recommendations

## 2022-02-08 NOTE — Telephone Encounter (Signed)
LMOVM to call back for pt ?

## 2022-02-17 ENCOUNTER — Other Ambulatory Visit: Payer: Self-pay

## 2022-02-17 ENCOUNTER — Telehealth: Payer: Self-pay

## 2022-02-17 MED ORDER — APIXABAN 5 MG PO TABS: 5.0000 mg | ORAL_TABLET | Freq: Two times a day (BID) | ORAL | 3 refills | Status: DC

## 2022-02-17 NOTE — Telephone Encounter (Signed)
Requesting rf on Eliquis

## 2022-02-17 NOTE — Telephone Encounter (Signed)
done

## 2022-02-18 NOTE — Telephone Encounter (Signed)
LMOVM to call back 

## 2022-03-03 ENCOUNTER — Ambulatory Visit: Payer: BC Managed Care – PPO | Admitting: Cardiology

## 2022-03-14 IMAGING — DX DG HAND COMPLETE 3+V*R*
3 series · 3 of 3 positions shown · non-contrast
Comparison: None.

CLINICAL DATA: Hand pain.

EXAM:
RIGHT HAND - COMPLETE 3+ VIEW

[hand pa]
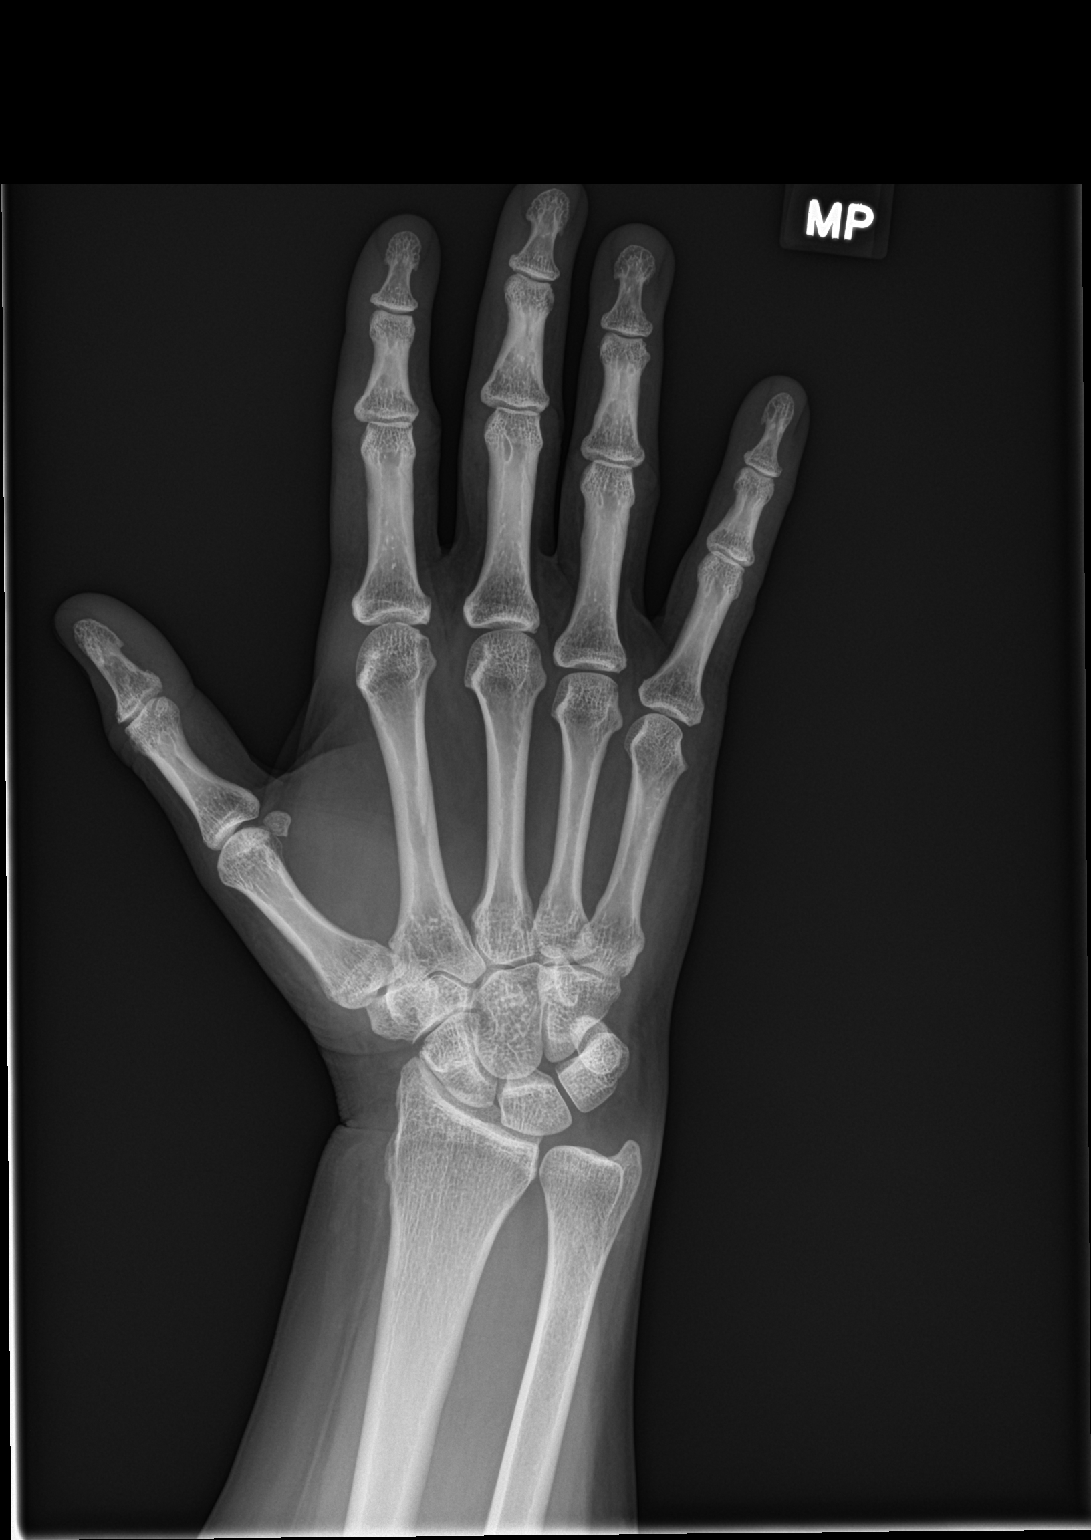

[hand obl]
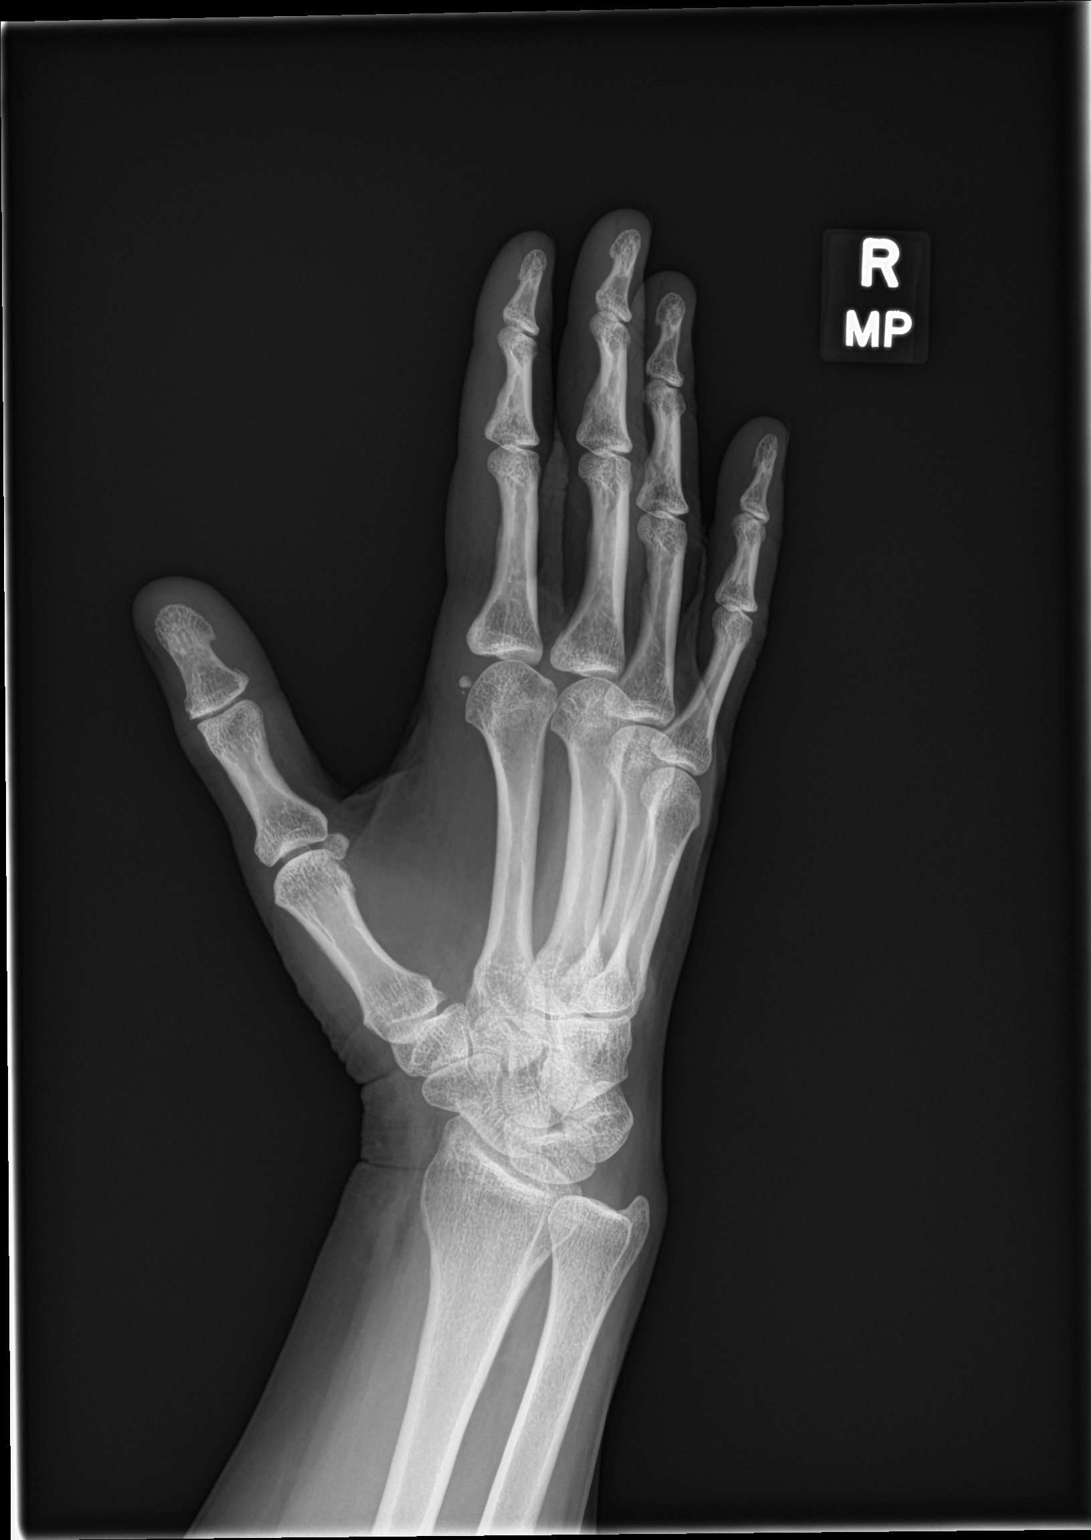

[hand lat]
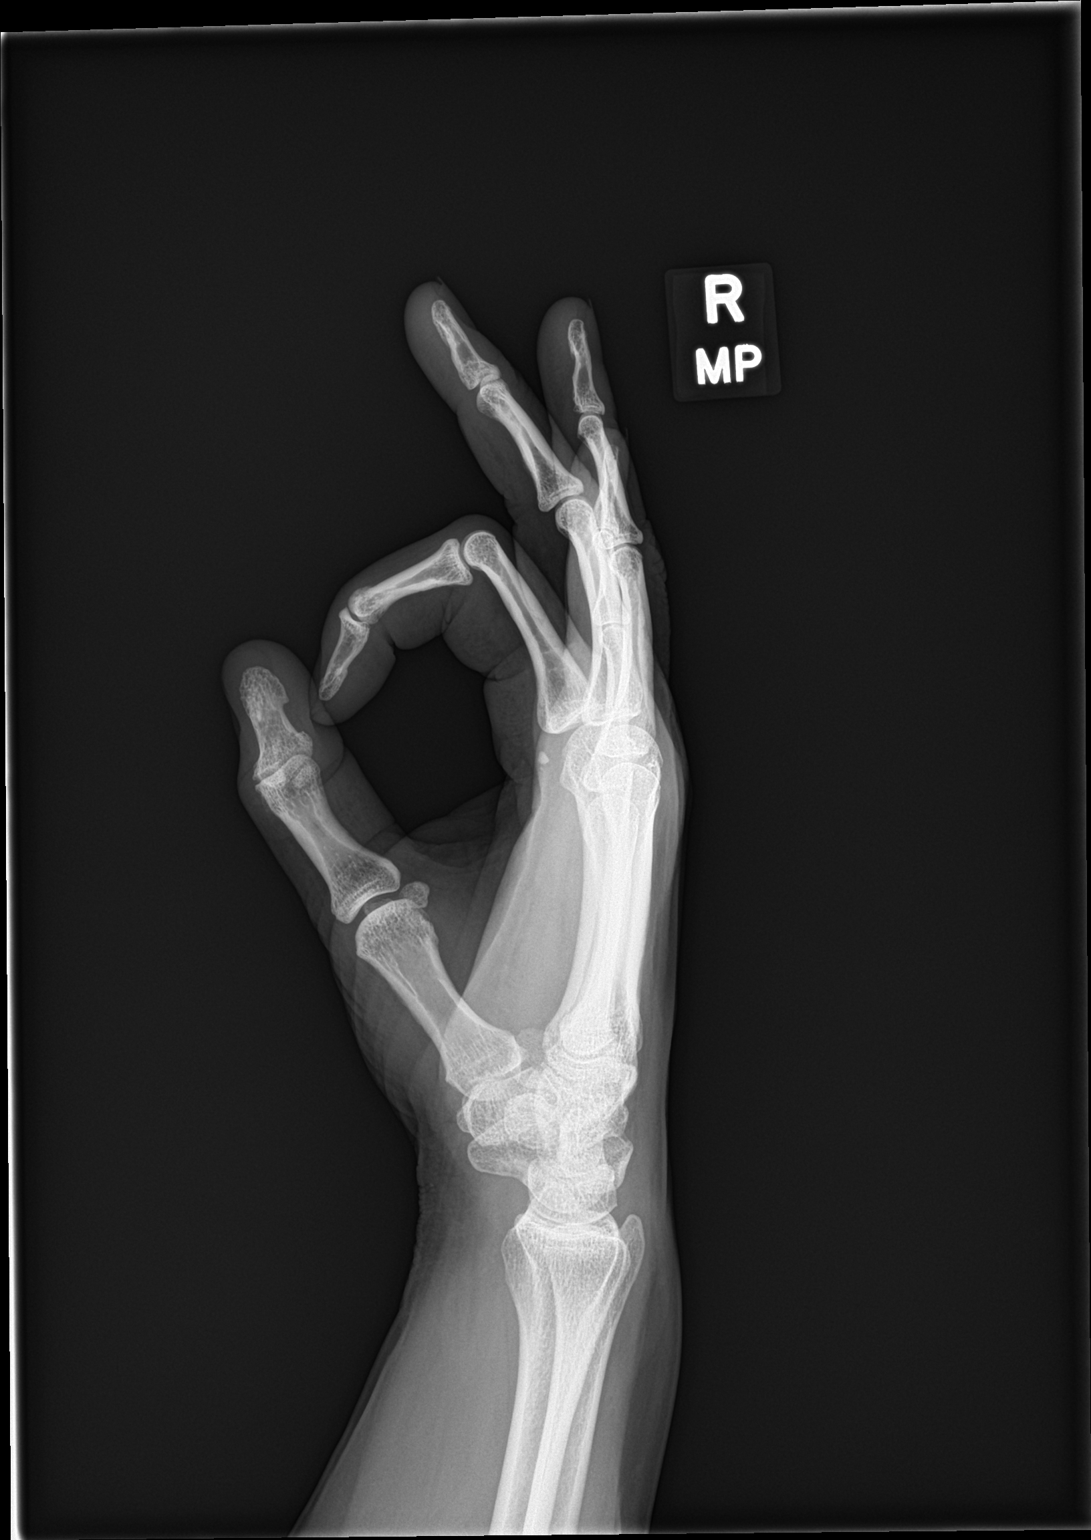

[3 of 3 positions shown; findings below may reference images not displayed]

FINDINGS: There is no evidence of fracture or dislocation. There is no
evidence of arthropathy or other focal bone abnormality. Soft
tissues are unremarkable.
IMPRESSION: Negative.

## 2022-03-15 NOTE — Telephone Encounter (Signed)
Received VM from pt. Called pt back and LMTCB

## 2022-03-15 NOTE — Telephone Encounter (Signed)
Patient called to schedule her colonoscopy - ph# [phone]

## 2022-03-18 ENCOUNTER — Telehealth: Payer: Self-pay | Admitting: *Deleted

## 2022-03-18 NOTE — Telephone Encounter (Signed)
Patient left voicemail that she was playing phone tag with the scheduler. She is at work now and would like a call before12:30 if possible because then she goes to lunch. She will be at work til 5 today. Work number is [phone]. Cell number [phone]. She is wanting to schedule colonoscopy with Dr. Milagros Reap.

## 2022-03-18 NOTE — Telephone Encounter (Signed)
Patient contacted the office and left message requesting a return call regarding inflammation.  Attempted to contact the patient and left message for patient to call the office.

## 2022-03-19 ENCOUNTER — Telehealth: Payer: Self-pay | Admitting: Family

## 2022-03-19 DIAGNOSIS — Z1211 Encounter for screening for malignant neoplasm of colon: Secondary | ICD-10-CM

## 2022-03-19 NOTE — Telephone Encounter (Signed)
Pt says she is going to hold off on the colonoscopy at this time. She says that she lives by herself and she would have to take off work, which she doesn't want to do at this time. She is going to get in touch with her PCP to arrange for the cologuard. FYI Dr.Castaneda

## 2022-03-19 NOTE — Telephone Encounter (Signed)
Thanks for the update

## 2022-03-19 NOTE — Telephone Encounter (Signed)
Pt called to let PCP know that she has decided not to see Marylee Floras for her issue. Wants to do Cologuard at home instead.  Please advise and call patient.

## 2022-03-19 NOTE — Telephone Encounter (Signed)
LM with Tillie Rung at pt's work to have her to return call

## 2022-03-19 NOTE — Telephone Encounter (Signed)
Is she having any issues? If so she needs to get colonoscopy, however, if this was just routine screening and no family hx of colon cancer we can do cologuard.

## 2022-03-19 NOTE — Addendum Note (Signed)
Addended by: Ladean Raya on: 03/19/2022 04:38 PM   Modules accepted: Orders

## 2022-03-19 NOTE — Telephone Encounter (Signed)
Lmtcb.

## 2022-03-19 NOTE — Telephone Encounter (Signed)
Patient states no problem. Will order cologaurd.

## 2022-03-19 NOTE — Telephone Encounter (Signed)
Noted  

## 2022-03-22 ENCOUNTER — Ambulatory Visit: Payer: BC Managed Care – PPO | Admitting: Cardiology

## 2022-03-24 ENCOUNTER — Telehealth: Payer: Self-pay | Admitting: *Deleted

## 2022-03-24 NOTE — Telephone Encounter (Signed)
I would like to see her back to take a look at her joints and her inflammation before prescribing any steroid or starting a longer term medication. She can use tylenol or topical voltaren or heat packs for the involved areas in the meantime if any of these help.

## 2022-03-24 NOTE — Telephone Encounter (Signed)
Patient contacted the office and states she would like to know what she can do for painful inflammation. Patient states the inflammation and pain goes from one hand to the other and from one foot to the other. Patient states she is at work and is unable to type or write. Patient states the inflammation happens every 2-3 days and last for about 2 days. Patient states is is affecting her sleep. Patient states she missed work on 03/16/2022 because she could not walk. Patient states she is on MTX 8 tabs weekly and has not missed any doses. Patient did make an appointment for 03/31/2022 for you to evaluate.

## 2022-03-24 NOTE — Telephone Encounter (Signed)
Attempted to contact the patient and left message for patient to call the office.  

## 2022-03-25 ENCOUNTER — Encounter: Payer: Self-pay | Admitting: *Deleted

## 2022-03-25 NOTE — Telephone Encounter (Signed)
Attempted to contact the patient and left message for patient to call the office.   Sent message via my chart

## 2022-03-30 ENCOUNTER — Encounter: Payer: Self-pay | Admitting: Cardiology

## 2022-03-30 ENCOUNTER — Ambulatory Visit: Payer: BC Managed Care – PPO | Admitting: Cardiology

## 2022-03-30 NOTE — Progress Notes (Signed)
Office Visit Note  Patient: Maria Winters             Date of Birth: 09-Oct-1960           MRN: [id number]             PCP: Sharion Balloon, FNP Referring: Sharion Balloon, FNP Visit Date: 03/31/2022   Subjective:  No chief complaint on file.   History of Present Illness: Maria Winters is a 62 y.o. female here for follow up ***   Previous HPI 01/25/22 Maria Winters is a 62 y.o. female here for follow up for seropositive RA  on MTX 15 mg PO weekly and folic acid 1 mg daily.  She has been mostly doing well since her last visit without significant daily peripheral joint pains or swelling.  Does get pain in her knees somewhat limiting for prolonged walking standing and low back pains in a fixed seated position.  She is noticing pain in her feet on both sides most often after driving.  Had some episodic increase in pain at her hands and elbows improving on their own within about 2 weeks.  Also recently went to the emergency department a few weeks ago due to onset of left-sided jaw pain.  Evaluation was reassuring against cardiac etiology thought to be temporomandibular joint pain.  She completed a steroid taper and low-dose hydrocodone symptoms have resolved.   Previous HPI 10/20/2021 Maria Winters is a 62 y.o. female here for follow up for seropositive RA on methotrexate 15 mg p.o. weekly.  She seen a sizable improvement in much of her joint symptoms and has not experienced any trouble taking the medication.  She is having some pain in the right hip most often occurred when sitting in 1 position for prolonged time usually about 30 minutes or longer provokes this.  Otherwise joint problems are mostly doing better and she is not seeing any ongoing swelling changes.   Previous HPI  06/22/2021 Maria Winters is a 62 y.o. female here for follow up for seropositive RA after starting methotrexate 15 mg PO weekly. Also increase vitamin D supplementation to 5000 units daily. She  misunderstood instructions so started taking methotrexate only 1 tablet weekly for 2 weeks then increased to 6 tablets as planned for the past 2 weeks. She has some diarrhea with the medication but not severe or lasting continuously. She had increased in bilateral heel pain worst when getting up to walk initially, the left side has improved but right side ongoing symptoms. Also now for about 3 days has pain in her left side around the flank or middle and low back. This radiates around to the front. No dysuria, frequency, or hematuria reported. She does not recall any injury. No history of kidney stones or kidney infections.   Previous HPI 05/07/21 Maria Winters is a 62 y.o. female here for follow up for new seropositive RA labs at initial visit negative screening for hepatitis and TB her CCP is highly positive and vitamin D low at 15. Xrays showed mild osteoarthritis in the feet otherwise unremarkable. She continues having significant joint pain symptoms affected hands and feet and fatigue. She notices more difficulty performing tasks at work such as pulling drawer handles or turning door knobs with pain or poor grip strength.   Previous HPI 04/21/21 Maria Winters is a 62 y.o. female here for evaluation of joint pain in multiple areas with elevated RF and sed rate. She reports  joint pain starting since about a month ago. Pain is affecting many areas including shoulders, elbows, wrists, fingers, hips, knees, and ankles. She has some night time awakenings due to hand pain. Decreased grip strength is affecting her daily activities including typing and driving. Swelling is most apparent in her hands and on the top of her feet around the base of the toes. She is stiff for about 3 hours each morning before getting some improvement in her mobility. She is not able to take NSAIDs due to anticoagulation for Afib she takes tylenol which helps but has to repeat this about every 8 hours for symptoms returning.    Labs reviewed 03/2021 RF 494 ESR 70   No Rheumatology ROS completed.   PMFS History:  Patient Active Problem List   Diagnosis Date Noted   OSA (obstructive sleep apnea) 11/27/2021   Morbid obesity with body mass index of 45.0-49.9 in adult [facility]) 05/11/2021   Paroxysmal atrial fibrillation ([place]) 05/11/2021   Intermittent palpitations 05/11/2021   Excessive daytime sleepiness 05/11/2021   At risk for central sleep apnea 05/11/2021   Seropositive rheumatoid arthritis ([place]) 04/21/2021   Vitamin D deficiency 04/21/2021   High risk medication use 04/21/2021   Myalgia 03/20/2021   Vertigo 07/23/2019   Constipation 07/23/2019   Anxiety state 03/18/2016   Migraine equivalent 03/18/2016   Type 2 diabetes mellitus with other specified complication ([place]) 14/97/0263   S/P lap cholecystectomy July 2014 09/14/2012   GERD (gastroesophageal reflux disease) 04/28/2012    Past Medical History:  Diagnosis Date   Abnormal Pap smear of cervix    in her 20's-- hx of conization/LEEP procedure-normal paps since   Abnormal uterine bleeding    Anemia    ~2005   Arthritis    Atrial fibrillation (HCC)    Chest pain    Fibroid    GERD (gastroesophageal reflux disease)    Helicobacter pylori (H. pylori)    Psoriasis    Reflux    STD (sexually transmitted disease) 1994   Tx'd for Chlamydia   TMJ (temporomandibular joint syndrome)    Vision abnormalities    tunnel vision    Family History  Problem Relation Age of Onset   Diabetes Mother    Hypertension Father    Healthy Sister    Hypertension Sister    Hypertension Brother    Gout Brother    Heart attack Brother    Breast cancer Sister 45       lumpectomy   Sickle cell trait Brother    Diabetes Maternal Grandmother        with 2 BKA   Stroke Maternal Grandmother    Breast cancer Maternal Aunt    Past Surgical History:  Procedure Laterality Date   cervix dilated     CHOLECYSTECTOMY N/A 09/13/2012   Procedure: LAPAROSCOPIC  CHOLECYSTECTOMY WITH INTRAOPERATIVE CHOLANGIOGRAM;  Surgeon: Pedro Earls, MD;  Location: [facility];  Service: General;  Laterality: N/A;   DILATION AND CURETTAGE OF UTERUS     DILATION AND CURETTAGE, DIAGNOSTIC / [place]   with Conization   ESOPHAGOGASTRODUODENOSCOPY N/A 05/11/2012   Procedure: ESOPHAGOGASTRODUODENOSCOPY (EGD);  Surgeon: Rogene Houston, MD;  Location: [facility];  Service: Endoscopy;  Laterality: N/A;  125   LEEP     Social History   Social History Narrative   Not on file   Immunization History  Administered Date(s) Administered   Influenza Split 04/19/2012   Influenza,inj,Quad PF,6+ Mos 01/24/2015, 12/23/2017, 12/07/2019, 03/20/2021, 01/07/2022  PFIZER(Purple Top)SARS-COV-2 Vaccination 06/16/2019, 07/07/2019, 01/26/2020   Pneumococcal Conjugate-13 08/31/2016   Pneumococcal Polysaccharide-23 12/07/2019   Tdap 08/31/2016     Objective: Vital Signs: There were no vitals taken for this visit.   Physical Exam   Musculoskeletal Exam: ***  CDAI Exam: CDAI Score: -- Patient Global: --; Provider Global: -- Swollen: --; Tender: -- Joint Exam 03/31/2022   No joint exam has been documented for this visit   There is currently no information documented on the homunculus. Go to the Rheumatology activity and complete the homunculus joint exam.  Investigation: No additional findings.  Imaging: No results found.  Recent Labs: Lab Results  Component Value Date   WBC 7.6 01/25/2022   HGB 11.9 01/25/2022   PLT 319 01/25/2022   NA 140 01/25/2022   K 4.1 01/25/2022   CL 104 01/25/2022   CO2 29 01/25/2022   GLUCOSE 230 (H) 01/25/2022   BUN 8 01/25/2022   CREATININE 0.72 01/25/2022   BILITOT 0.3 01/25/2022   ALKPHOS 97 01/07/2022   AST 12 01/25/2022   ALT 22 01/25/2022   PROT 6.5 01/25/2022   ALBUMIN 3.8 (L) 01/07/2022   CALCIUM 8.8 01/25/2022   GFRAA 83 08/24/2019   QFTBGOLDPLUS NEGATIVE 04/21/2021    Speciality Comments: No specialty  comments available.  Procedures:  No procedures performed Allergies: Esomeprazole magnesium   Assessment / Plan:     Visit Diagnoses: No diagnosis found.  ***  Orders: No orders of the defined types were placed in this encounter.  No orders of the defined types were placed in this encounter.    Follow-Up Instructions: No follow-ups on file.   Collier Salina, MD  Note - This record has been created using [employer].  Chart creation errors have been sought, but may not always  have been located. Such creation errors do not reflect on  the standard of medical care.

## 2022-03-31 ENCOUNTER — Ambulatory Visit: Payer: BC Managed Care – PPO | Admitting: Cardiology

## 2022-03-31 ENCOUNTER — Ambulatory Visit: Payer: BC Managed Care – PPO | Attending: Internal Medicine | Admitting: Internal Medicine

## 2022-03-31 ENCOUNTER — Encounter: Payer: Self-pay | Admitting: Cardiology

## 2022-03-31 VITALS — BP 137/79 | HR 83 | Resp 16 | Ht 65.5 in | Wt 293.8 lb

## 2022-03-31 VITALS — BP 144/80 | HR 57 | Resp 12 | Ht 65.5 in | Wt 291.0 lb

## 2022-03-31 DIAGNOSIS — M059 Rheumatoid arthritis with rheumatoid factor, unspecified: Secondary | ICD-10-CM | POA: Diagnosis not present

## 2022-03-31 DIAGNOSIS — R0609 Other forms of dyspnea: Secondary | ICD-10-CM

## 2022-03-31 DIAGNOSIS — E1165 Type 2 diabetes mellitus with hyperglycemia: Secondary | ICD-10-CM

## 2022-03-31 DIAGNOSIS — I10 Essential (primary) hypertension: Secondary | ICD-10-CM

## 2022-03-31 DIAGNOSIS — I480 Paroxysmal atrial fibrillation: Secondary | ICD-10-CM | POA: Diagnosis not present

## 2022-03-31 DIAGNOSIS — Z79899 Other long term (current) drug therapy: Secondary | ICD-10-CM | POA: Diagnosis not present

## 2022-03-31 DIAGNOSIS — R0989 Other specified symptoms and signs involving the circulatory and respiratory systems: Secondary | ICD-10-CM | POA: Diagnosis not present

## 2022-03-31 MED ORDER — SEMAGLUTIDE (1 MG/DOSE) 4 MG/3ML ~~LOC~~ SOPN: 1.0000 mg | PEN_INJECTOR | SUBCUTANEOUS | 2 refills | Status: DC

## 2022-03-31 NOTE — Progress Notes (Signed)
Primary Physician/Referring:  Sharion Balloon, FNP  Patient ID: Maria Winters, female    DOB: 08-30-1960, 62 y.o.   MRN: [id number]  Chief Complaint  Patient presents with   PAD   Hyperlipidemia   Hypertension   Follow-up    6 months    HPI:    Maria Winters  is a 62 y.o. African-American obese female with history of diabetes, hypertension, hyperlipidemia, morbid obesity and OSA on CPAP and compliant, psoriasis of the skin and also seropositive rheumatoid arthritis being followed by rheumatology and presently on long-term methotrexate presents for 41-monthoffice visit, diagnosed with PAF in April 2022 when she presented to the emergency room.   This is 663-monthffice visit, she has noticed gradually progressive dyspnea.  No PND or orthopnea.  She is now being treated for severe sleep apnea with CPAP, which she reports nightly compliance.  States her energy has been improving over the last 2 months.  She continues to tolerate anticoagulation without bleeding diathesis.  Denies chest pain, dizziness, syncope, near syncope.  Denies orthopnea, PND, leg edema.  Past Medical History:  Diagnosis Date   Abnormal Pap smear of cervix    in her 20's-- hx of conization/LEEP procedure-normal paps since   Abnormal uterine bleeding    Anemia    ~2005   Arthritis    Atrial fibrillation (HCC)    Chest pain    Fibroid    GERD (gastroesophageal reflux disease)    Helicobacter pylori (H. pylori)    Psoriasis    Reflux    STD (sexually transmitted disease) 1994   Tx'd for Chlamydia   TMJ (temporomandibular joint syndrome)    Vision abnormalities    tunnel vision   Past Surgical History:  Procedure Laterality Date   cervix dilated     CHOLECYSTECTOMY N/A 09/13/2012   Procedure: LAPAROSCOPIC CHOLECYSTECTOMY WITH INTRAOPERATIVE CHOLANGIOGRAM;  Surgeon: MaPedro EarlsMD;  Location: [facility];  Service: General;  Laterality: N/A;   DILATION AND CURETTAGE OF UTERUS     DILATION AND  CURETTAGE, DIAGNOSTIC / [place] with [place]   ESOPHAGOGASTRODUODENOSCOPY N/A 05/11/2012   Procedure: ESOPHAGOGASTRODUODENOSCOPY (EGD);  Surgeon: NaRogene HoustonMD;  Location: [facility];  Service: Endoscopy;  Laterality: N/A;  89   LEEP     Family History  Problem Relation Age of Onset   Diabetes Mother    Hypertension Father    Healthy Sister    Hypertension Sister    Hypertension Brother    Gout Brother    Heart attack Brother    Breast cancer Sister [id number]     lumpectomy   Sickle cell trait Brother    Diabetes Maternal Grandmother        with 2 BKA   Stroke Maternal Grandmother    Breast cancer Maternal Aunt     Social History   Tobacco Use   Smoking status: Former    Packs/day: 0.25    Years: 10.00    Total pack years: 2.50    Types: Cigarettes    Quit date: 06/20/1992    Years since quitting: 29.8    Passive exposure: Never   Smokeless tobacco: Never  Substance Use Topics   Alcohol use: Never   Marital Status: Single   ROS  Review of Systems  Cardiovascular:  Positive for dyspnea on exertion. Negative for chest pain and leg swelling.  Respiratory:  Positive for snoring (On CPAP and compliant).  Objective  Blood pressure 137/79, pulse 83, resp. rate 16, height 5' 5.5" (1.664 m), weight 293 lb 12.8 oz (133.3 kg), SpO2 97 %.     03/31/2022   11:38 AM 03/31/2022   10:42 AM 03/31/2022    8:37 AM  Vitals with BMI  Height  5' 5.5" 5' 5.5"  Weight  291 lbs 293 lbs 13 oz  BMI  81.01 75.10  Systolic 258 527 782  Diastolic 80 96 79  Pulse 57 56 83      Physical Exam Constitutional:      Appearance: She is morbidly obese.  Neck:     Vascular: Carotid bruit (Left) present. No JVD.  Cardiovascular:     Rate and Rhythm: Normal rate and regular rhythm.     Pulses: Intact distal pulses.     Heart sounds: Murmur heard.     Midsystolic murmur is present with a grade of 2/6 at the upper right sternal border radiating to the apex.     No gallop.   Pulmonary:     Effort: Pulmonary effort is normal.     Breath sounds: Normal breath sounds.  Abdominal:     General: Bowel sounds are normal.     Palpations: Abdomen is soft.  Musculoskeletal:     Right lower leg: No edema.     Left lower leg: No edema.    Laboratory examination:   Recent Labs    01/07/22 1109 01/25/22 1426 03/31/22 1136  NA 142 140 140  K 3.7 4.1 4.6  CL 100 104 104  CO2 '28 29 30  '$ GLUCOSE 95 230* 99  BUN '12 8 9  '$ CREATININE 0.77 0.72 0.61  CALCIUM 9.4 8.8 9.6      Latest Ref Rng & Units 03/31/2022   11:36 AM 01/25/2022    2:26 PM 01/07/2022   11:09 AM  CMP  Glucose 65 - 99 mg/dL 99  230  95   BUN 7 - 25 mg/dL '9  8  12   '$ Creatinine 0.50 - 1.05 mg/dL 0.61  0.72  0.77   Sodium 135 - 146 mmol/L 140  140  142   Potassium 3.5 - 5.3 mmol/L 4.6  4.1  3.7   Chloride 98 - 110 mmol/L 104  104  100   CO2 20 - 32 mmol/L '30  29  28   '$ Calcium 8.6 - 10.4 mg/dL 9.6  8.8  9.4   Total Protein 6.1 - 8.1 g/dL 6.9  6.5  7.0   Total Bilirubin 0.2 - 1.2 mg/dL 0.3  0.3  0.3   Alkaline Phos 44 - 121 IU/L   97   AST 10 - 35 U/L '12  12  7   '$ ALT 6 - 29 U/L '17  22  21       '$ Latest Ref Rng & Units 03/31/2022   11:36 AM 01/25/2022    2:26 PM 01/07/2022   11:09 AM  CBC  WBC 3.8 - 10.8 Thousand/uL 6.0  7.6  13.4   Hemoglobin 11.7 - 15.5 g/dL 12.0  11.9  12.6   Hematocrit 35.0 - 45.0 % 36.3  36.8  37.3   Platelets 140 - 400 Thousand/uL 410  319  380     Lipid Panel Recent Labs    01/07/22 1109  CHOL 170  TRIG 79  LDLCALC 63  HDL 92  CHOLHDL 1.8     HEMOGLOBIN A1C Lab Results  Component Value Date   HGBA1C 7.3 (H) 01/07/2022  TSH Recent Labs    01/07/22 1109  TSH 1.130    External labs:  None  Allergies   Allergies  Allergen Reactions   Esomeprazole Magnesium     Patient states that the Nexium caused tremors     Final Medications at End of Visit     Current Outpatient Medications:    acetaminophen (TYLENOL) 325 MG tablet, Take 650 mg by  mouth every 6 (six) hours as needed., Disp: , Rfl:    albuterol (VENTOLIN HFA) 108 (90 Base) MCG/ACT inhaler, Inhale into the lungs., Disp: , Rfl:    apixaban (ELIQUIS) 5 MG TABS tablet, Take 1 tablet (5 mg total) by mouth 2 (two) times daily., Disp: 60 tablet, Rfl: 3   clobetasol ointment (TEMOVATE) 0.05 %, APPLY TOPICALLY TO AFFECTED AREA ONCE DAILY UNTIL GONE, Disp: 15 g, Rfl: 0   dexamethasone 0.5 MG/5ML elixir, Take 0.5 mg by mouth 4 (four) times daily., Disp: , Rfl:    fluocinonide gel (LIDEX) 0.05 %, Apply topically 4 (four) times daily., Disp: , Rfl:    folic acid (FOLVITE) 1 MG tablet, Take 1 tablet by mouth once daily, Disp: 90 tablet, Rfl: 3   metoprolol succinate (TOPROL-XL) 25 MG 24 hr tablet, Take 1 tablet (25 mg total) by mouth daily., Disp: 90 tablet, Rfl: 3   Polyethylene Glycol 3350 (MIRALAX PO), Take by mouth daily., Disp: , Rfl:    [START ON 05/13/2022] Semaglutide, 1 MG/DOSE, 4 MG/3ML SOPN, Inject 1 mg into the skin once a week., Disp: 3 mL, Rfl: 2   methotrexate (RHEUMATREX) 2.5 MG tablet, Take 10 tablets (25 mg total) by mouth once a week. CHEMOTHERAPY,  PROTECT  FROM  LIGHT, Disp: 120 tablet, Rfl: 0  Current Facility-Administered Medications:    Semaglutide(0.25 or 0.'5MG'$ /DOS) SOPN 0.25 mg, 0.25 mg, Subcutaneous, Once, Adrian Prows, MD   Radiology:   [facility] Chest Portable 1 View 07/02/2020 EKG leads project over the chest. Trachea midline. Cardiomediastinal contours are top normal to mildly enlarged, accentuated by portable technique. Mild central pulmonary vascular engorgement may be present. No consolidation. No sign of pleural effusion. Subtle opacities are seen in the RIGHT and LEFT mid chest. Also at the LEFT lung base along the LEFT heart border. On limited assessment no acute skeletal process.   IMPRESSION: Mild cardiac enlargement with signs of central pulmonary vascular engorgement. Subtle increased interstitial markings and vague opacities could also reflect asymmetric  edema or viral infection. Consider PA and lateral chest radiograph to ensure resolution on follow-up.   Cardiac Studies:   [facility] MYOCARDIAL PERFUSION WO LEXISCAN 08/06/2020 Normal ECG stress. The patient exercised for 3 minutes and 2 seconds of a Bruce protocol, achieving approximately 4.66 METs.  Markedly reduced exercise tolerance.  Symptoms of dyspnea.  Normal blood pressure response. Mild breast attenuation noted in the inferior wall in RAW images. Myocardial perfusion is normal. Overall LV systolic function is normal without regional wall motion abnormalities. Stress LV EF: 66%. No previous exam available for comparison. Low risk study.  Ambulatory cardiac telemetry 07/04/2020 - 07/18/2020: Predominant rhythm was sinus with maximum heart rate 40 bpm, maximum heart rate 179 bpm, average heart rate of 70 bpm.  Patient's symptoms correlated with normal sinus rhythm, PACs, and PVCs.  Patient on 48 episodes of supraventricular tachycardia with longest lasting 14.6 seconds and maximum heart rate 179 bpm.  Some episodes of SVT may be possible atrial tachycardia.  Rare PACs and PVCs.  Ventricular bigeminy noted.  No atrial fibrillation or flutter, high  degree AV block, or ventricular tachycardia.  PCV ECHOCARDIOGRAM COMPLETE 24/58/0998 Normal LV systolic function with EF 66%. Left ventricle cavity is normal in size. Normal left ventricular wall thickness. Normal global wall motion. Doppler evidence of grade I (impaired) diastolic dysfunction, normal LAP. Calculated EF 66%. Trileaflet aortic valve.  Trace aortic regurgitation.  EKG:   EKG 03/31/2022: Normal sinus rhythm with rate of 65 bpm, normal EKG. Compared to 09/04/2021, no change.   Assessment     ICD-10-CM   1. Paroxysmal atrial fibrillation (HCC)  I48.0 EKG 12-Lead    PCV ECHOCARDIOGRAM COMPLETE    2. Dyspnea on exertion  R06.09 PCV ECHOCARDIOGRAM COMPLETE    3. Left carotid bruit  R09.89 PCV CAROTID DUPLEX (BILATERAL)    4. Primary  hypertension  I10     5. Class 3 severe obesity due to excess calories with serious comorbidity and body mass index (BMI) of 40.0 to 44.9 in adult (HCC)  E66.01    Z68.41     6. Type 2 diabetes mellitus with hyperglycemia, without long-term current use of insulin (HCC)  E11.65 Semaglutide, 1 MG/DOSE, 4 MG/3ML SOPN    Semaglutide(0.25 or 0.'5MG'$ /DOS) SOPN 0.25 mg    This patients CHA2DS2-VASc Score 2 (F, DM) and yearly risk of stroke 2.2%.   There are no discontinued medications.   Meds ordered this encounter  Medications   Semaglutide, 1 MG/DOSE, 4 MG/3ML SOPN    Sig: Inject 1 mg into the skin once a week.    Dispense:  3 mL    Refill:  2   Semaglutide(0.25 or 0.'5MG'$ /DOS) SOPN 0.25 mg   Subcu semaglutide 0.25 mg dose administered to the patient, educated her about using the medication, discussed the side effects, patient is comfortable.  Recommendations:   Maria Winters is a 62 y.o.   African-American obese female with history of diabetes, hypertension, hyperlipidemia, morbid obesity and OSA on CPAP and compliant, psoriasis of the skin and also seropositive rheumatoid arthritis being followed by rheumatology and presently on long-term methotrexate presents for 28-monthoffice visit, diagnosed with PAF in April 2022 when she presented to the emergency room.    1. Paroxysmal atrial fibrillation (HCC) Patient is presently sinus rhythm, EKG is normal.  In view of morbid obesity, OSA on CPAP, she is certainly at high risk for recurrence of atrial fibrillation and although chads vascular score is low and she has not had any recurrence since April 2022 that is documented for atrial fibrillation and she remains asymptomatic, would probably continue anticoagulation long-term.  She also has connective tissue disease.  2. Dyspnea on exertion Patient complains of gradually worsening dyspnea on exertion.  No PND or orthopnea.  No clinical evidence heart failure.  I will repeat echocardiogram.  She  has had a negative nuclear stress test in 2022.  Suspect her dyspnea is related to diastolic dysfunction and also probably obesity hypoventilation and deconditioning.  I also want to exclude secondary hypertension/primary hypertension in view of connective tissue disease.  3. Left carotid bruit She has a soft left carotid bruit, will obtain carotid artery duplex.  4. Primary hypertension Blood pressure is under excellent control.  5. Class 3 severe obesity due to excess calories with serious comorbidity and body mass index (BMI) of 40.0 to 44.9 in adult ([facility] She has morbid obesity due to excess calories, she also has type 2 diabetes and she is not on any medication, she could not tolerate metformin due to diarrhea.  I will start her on  Ozempic both from cardiovascular standpoint and for obesity and for diabetes control.  She will start with 0.25 mg subcu q. 1 week and gradually uptitrate to 0.5 mg and then eventually to 1 mg.  She may need much higher doses as deemed necessary.  I have discussed with her how to take the medication, to reduce her food intake otherwise she would feel sick and nauseous, to take break in between her meals for 10 minutes halfway through to reduce side effects and also to improve satiety.  6. Type 2 diabetes mellitus with hyperglycemia, without long-term current use of insulin (HCC) I will see her back in 6 weeks for follow-up of obesity, carotid bruit and also hypertension and dyspnea.  Will follow-up on echocardiogram and carotid duplex and weight management as well.   This was a 40-minute office encounter in reviewing her external records, discussion regarding diabetes, obesity and arrhythmias.    Adrian Prows, MD, [facility] 04/03/2022, 9:45 AM Office: [phone] Fax: [phone] Pager: [phone]

## 2022-04-01 LAB — CBC WITH DIFFERENTIAL/PLATELET
Absolute Monocytes: 522 cells/uL (ref 200–950)
Basophils Absolute: 60 cells/uL (ref 0–200)
Basophils Relative: 1 %
Eosinophils Absolute: 228 cells/uL (ref 15–500)
Eosinophils Relative: 3.8 %
HCT: 36.3 % (ref 35.0–45.0)
Hemoglobin: 12 g/dL (ref 11.7–15.5)
Lymphs Abs: 1770 cells/uL (ref 850–3900)
MCH: 28.7 pg (ref 27.0–33.0)
MCHC: 33.1 g/dL (ref 32.0–36.0)
MCV: 86.8 fL (ref 80.0–100.0)
MPV: 8.7 fL (ref 7.5–12.5)
Monocytes Relative: 8.7 %
Neutro Abs: 3420 cells/uL (ref 1500–7800)
Neutrophils Relative %: 57 %
Platelets: 410 10*3/uL — ABNORMAL HIGH (ref 140–400)
RBC: 4.18 10*6/uL (ref 3.80–5.10)
RDW: 13.9 % (ref 11.0–15.0)
Total Lymphocyte: 29.5 %
WBC: 6 10*3/uL (ref 3.8–10.8)

## 2022-04-01 LAB — COMPLETE METABOLIC PANEL WITH GFR
AG Ratio: 1.2 (calc) (ref 1.0–2.5)
ALT: 17 U/L (ref 6–29)
AST: 12 U/L (ref 10–35)
Albumin: 3.7 g/dL (ref 3.6–5.1)
Alkaline phosphatase (APISO): 78 U/L (ref 37–153)
BUN: 9 mg/dL (ref 7–25)
CO2: 30 mmol/L (ref 20–32)
Calcium: 9.6 mg/dL (ref 8.6–10.4)
Chloride: 104 mmol/L (ref 98–110)
Creat: 0.61 mg/dL (ref 0.50–1.05)
Globulin: 3.2 g/dL (calc) (ref 1.9–3.7)
Glucose, Bld: 99 mg/dL (ref 65–99)
Potassium: 4.6 mmol/L (ref 3.5–5.3)
Sodium: 140 mmol/L (ref 135–146)
Total Bilirubin: 0.3 mg/dL (ref 0.2–1.2)
Total Protein: 6.9 g/dL (ref 6.1–8.1)
eGFR: 100 mL/min/{1.73_m2} (ref >=60)

## 2022-04-01 LAB — SEDIMENTATION RATE: Sed Rate: 55 mm/h — ABNORMAL HIGH (ref 0–30)

## 2022-04-01 LAB — C-REACTIVE PROTEIN: CRP: 31.6 mg/L — ABNORMAL HIGH (ref <8.0)

## 2022-04-02 ENCOUNTER — Other Ambulatory Visit: Payer: Self-pay

## 2022-04-02 DIAGNOSIS — M059 Rheumatoid arthritis with rheumatoid factor, unspecified: Secondary | ICD-10-CM

## 2022-04-02 MED ORDER — METHOTREXATE SODIUM 2.5 MG PO TABS: 25.0000 mg | ORAL_TABLET | ORAL | 0 refills | Status: DC

## 2022-04-02 NOTE — Progress Notes (Signed)
Lab results show no problem for the methotrexate. Her sedimentation rate and CRP remain significantly elevated consistent with active inflammation. She can try the methotrexate at the increased dose and monitor symptoms. Hopefully also sees improvement with starting the ozempic.  If flaring up again like she just experienced she should contact us, we can call in a steroid taper as needed.

## 2022-04-02 NOTE — Progress Notes (Signed)
Patient requested a refill of MTX when I called with her lab results.   Labs: 03/31/2022 Lab results show no problem for the methotrexate. Her sedimentation rate and CRP remain significantly elevated consistent with active inflammation. She can try the methotrexate at the increased dose and monitor symptoms. Hopefully also sees improvement with starting the ozempic.   If flaring up again like she just experienced she should contact us, we can call in a steroid taper as needed.  Dose per office note on 03/31/2022: recommend trying to maximize current monotherapy going up to methotrexate 25 mg p.o. weekly   Please review and send pended [facility] to [facility] [place]. Thanks!

## 2022-04-03 MED ORDER — SEMAGLUTIDE(0.25 OR 0.5MG/DOS) 2 MG/1.5ML ~~LOC~~ SOPN: 0.2500 mg | PEN_INJECTOR | Freq: Once | SUBCUTANEOUS | Status: DC

## 2022-04-05 DIAGNOSIS — Z1211 Encounter for screening for malignant neoplasm of colon: Secondary | ICD-10-CM | POA: Diagnosis not present

## 2022-04-06 ENCOUNTER — Other Ambulatory Visit: Payer: Self-pay

## 2022-04-06 DIAGNOSIS — E1165 Type 2 diabetes mellitus with hyperglycemia: Secondary | ICD-10-CM

## 2022-04-06 MED ORDER — SEMAGLUTIDE (1 MG/DOSE) 4 MG/3ML ~~LOC~~ SOPN: 1.0000 mg | PEN_INJECTOR | SUBCUTANEOUS | 2 refills | Status: DC

## 2022-04-15 ENCOUNTER — Other Ambulatory Visit: Payer: BC Managed Care – PPO

## 2022-04-15 LAB — COLOGUARD: COLOGUARD: NEGATIVE

## 2022-04-28 ENCOUNTER — Ambulatory Visit: Payer: BC Managed Care – PPO | Admitting: Internal Medicine

## 2022-04-29 ENCOUNTER — Other Ambulatory Visit: Payer: BC Managed Care – PPO

## 2022-04-30 ENCOUNTER — Ambulatory Visit: Payer: BC Managed Care – PPO | Admitting: Family

## 2022-04-30 ENCOUNTER — Telehealth: Payer: Self-pay | Admitting: Family

## 2022-04-30 ENCOUNTER — Encounter: Payer: Self-pay | Admitting: Family

## 2022-04-30 VITALS — BP 116/80 | HR 65 | Temp 97.5°F | Ht 65.5 in | Wt 273.0 lb

## 2022-04-30 DIAGNOSIS — R059 Cough, unspecified: Secondary | ICD-10-CM | POA: Diagnosis not present

## 2022-04-30 DIAGNOSIS — J069 Acute upper respiratory infection, unspecified: Secondary | ICD-10-CM

## 2022-04-30 LAB — VERITOR FLU A/B WAIVED
Influenza A: NEGATIVE
Influenza B: NEGATIVE

## 2022-04-30 MED ORDER — CETIRIZINE HCL 10 MG PO TABS: 10.0000 mg | ORAL_TABLET | Freq: Every day | ORAL | 1 refills | Status: DC

## 2022-04-30 MED ORDER — FLUTICASONE PROPIONATE 50 MCG/ACT NA SUSP: 2.0000 | Freq: Every day | NASAL | 6 refills | Status: DC

## 2022-04-30 MED ORDER — MOLNUPIRAVIR EUA 200MG CAPSULE: 4.0000 | ORAL_CAPSULE | Freq: Two times a day (BID) | ORAL | 0 refills | Status: AC

## 2022-04-30 NOTE — Patient Instructions (Signed)

## 2022-04-30 NOTE — Progress Notes (Signed)
Subjective:    Patient ID: Maria Winters, female    DOB: Nov 07, 1960, 62 y.o.   MRN: [id number]  Chief Complaint  Patient presents with   Cough   Sore Throat   Fever   Chills    Cough This is a new problem. The current episode started in the past 7 days. The problem has been gradually worsening. The problem occurs every few minutes. The cough is Non-productive. Associated symptoms include chills, a fever, headaches, nasal congestion, postnasal drip and shortness of breath. Pertinent negatives include no ear congestion, ear pain, myalgias or wheezing. She has tried rest for the symptoms. The treatment provided mild relief.  Sore Throat  Associated symptoms include coughing, headaches and shortness of breath. Pertinent negatives include no ear pain.  Fever  Associated symptoms include coughing and headaches. Pertinent negatives include no ear pain or wheezing.      Review of Systems  Constitutional:  Positive for chills and fever.  HENT:  Positive for postnasal drip. Negative for ear pain.   Respiratory:  Positive for cough and shortness of breath. Negative for wheezing.   Musculoskeletal:  Negative for myalgias.  Neurological:  Positive for headaches.  All other systems reviewed and are negative.      Objective:   Physical Exam Vitals reviewed.  Constitutional:      General: She is not in acute distress.    Appearance: She is well-developed. She is obese.  HENT:     Head: Normocephalic and atraumatic.     Right Ear: External ear normal.     Left Ear: A middle ear effusion is present.     Nose: Congestion present.  Eyes:     Pupils: Pupils are equal, round, and reactive to light.  Neck:     Thyroid: No thyromegaly.  Cardiovascular:     Rate and Rhythm: Normal rate and regular rhythm.     Heart sounds: Normal heart sounds. No murmur heard. Pulmonary:     Effort: Pulmonary effort is normal. No respiratory distress.     Breath sounds: Normal breath sounds. No  wheezing.  Abdominal:     General: Bowel sounds are normal. There is no distension.     Palpations: Abdomen is soft.     Tenderness: There is no abdominal tenderness.  Musculoskeletal:        General: No tenderness. Normal range of motion.     Cervical back: Normal range of motion and neck supple.  Skin:    General: Skin is warm and dry.  Neurological:     Mental Status: She is alert and oriented to person, place, and time.     Cranial Nerves: No cranial nerve deficit.     Deep Tendon Reflexes: Reflexes are normal and symmetric.  Psychiatric:        Behavior: Behavior normal.        Thought Content: Thought content normal.        Judgment: Judgment normal.          BP 116/80   Pulse 65   Temp (!) 97.5 F (36.4 C) (Temporal)   Ht 5' 5.5" (1.664 m)   Wt 273 lb (123.8 kg)   SpO2 94%   BMI 44.74 kg/m   Assessment & Plan:  Maria Winters comes in today with chief complaint of Cough, Sore Throat, Fever, and Chills   Diagnosis and orders addressed:  1. Cough, unspecified type - Veritor Flu A/B Waived - Novel Coronavirus, NAA ([facility])  2.  Viral URI with cough - Take meds as prescribed - Use a cool mist humidifier  -Use saline nose sprays frequently -Force fluids -For any cough or congestion  Use plain Mucinex- regular strength or max strength is fine -For fever or aces or pains- take tylenol or ibuprofen. -Throat lozenges if help -Follow up if symptoms worsen or do not improve  - cetirizine (ZYRTEC ALLERGY) 10 MG tablet; Take 1 tablet (10 mg total) by mouth daily.  Dispense: 90 tablet; Refill: 1 - fluticasone (FLONASE) 50 MCG/ACT nasal spray; Place 2 sprays into both nostrils daily.  Dispense: 16 g; Refill: [place], FNP

## 2022-04-30 NOTE — Telephone Encounter (Signed)
Patient aware and verbalized understanding. °

## 2022-04-30 NOTE — Telephone Encounter (Signed)
Pt called to let Alyse Low know that her home covid test came back positive. Wants to know what next steps are.

## 2022-04-30 NOTE — Telephone Encounter (Signed)
Molnupiravir Prescription sent to pharmacy   Rest, force fluids, tylenol as needed, Quarantine for at least 5 days and you are fever free, then must wear a mask out in public from day [id number], report any worsening symptoms such as increased shortness of breath, swelling, or continued high fevers.

## 2022-05-01 LAB — NOVEL CORONAVIRUS, NAA: SARS-CoV-2, NAA: DETECTED — AB

## 2022-05-03 NOTE — Telephone Encounter (Signed)
Pt is needing a work note to return to work Thursday 05/06/2022. Fax to [employer] [phone]

## 2022-05-03 NOTE — Telephone Encounter (Signed)
Work note sent to [employer].   Evelina Dun, FNP

## 2022-05-03 NOTE — Telephone Encounter (Signed)
Pt aware the note is on her MyChart.

## 2022-05-10 ENCOUNTER — Ambulatory Visit: Payer: BC Managed Care – PPO | Admitting: Family

## 2022-05-12 ENCOUNTER — Ambulatory Visit: Payer: BC Managed Care – PPO | Admitting: Cardiology

## 2022-05-21 ENCOUNTER — Ambulatory Visit: Payer: BC Managed Care – PPO

## 2022-05-21 DIAGNOSIS — I480 Paroxysmal atrial fibrillation: Secondary | ICD-10-CM | POA: Diagnosis not present

## 2022-05-21 DIAGNOSIS — R0609 Other forms of dyspnea: Secondary | ICD-10-CM | POA: Diagnosis not present

## 2022-05-24 ENCOUNTER — Ambulatory Visit: Payer: BC Managed Care – PPO | Admitting: Family

## 2022-05-24 ENCOUNTER — Encounter: Payer: Self-pay | Admitting: Cardiology

## 2022-05-24 ENCOUNTER — Ambulatory Visit: Payer: BC Managed Care – PPO | Admitting: Cardiology

## 2022-05-24 VITALS — BP 105/69 | HR 84 | Resp 14 | Ht 65.5 in | Wt 266.4 lb

## 2022-05-24 DIAGNOSIS — I10 Essential (primary) hypertension: Secondary | ICD-10-CM

## 2022-05-24 DIAGNOSIS — I480 Paroxysmal atrial fibrillation: Secondary | ICD-10-CM | POA: Diagnosis not present

## 2022-05-24 DIAGNOSIS — R0989 Other specified symptoms and signs involving the circulatory and respiratory systems: Secondary | ICD-10-CM

## 2022-05-24 DIAGNOSIS — E1165 Type 2 diabetes mellitus with hyperglycemia: Secondary | ICD-10-CM

## 2022-05-24 DIAGNOSIS — Z6841 Body Mass Index (BMI) 40.0 and over, adult: Secondary | ICD-10-CM

## 2022-05-24 NOTE — Progress Notes (Unsigned)
Primary Physician/Referring:  Sharion Balloon, FNP  Patient ID: Maria Winters, female    DOB: 10-03-1960, 62 y.o.   MRN: [id number]  No chief complaint on file.   HPI:    Maria Winters  is a 62 y.o. African-American obese female with history of diabetes, hypertension, hyperlipidemia, morbid obesity and OSA on CPAP and compliant, psoriasis of the skin and also seropositive rheumatoid arthritis being followed by rheumatology and presently on long-term methotrexate presents for 3-month office visit, diagnosed with PAF in April 2022 when she presented to the emergency room.   This is 6-week office visit, I had started her on Ozempic for diabetes and also weight loss and obtain echocardiogram as she complained of gradually progressive dyspnea.  No PND or orthopnea.  Denies chest pain, dizziness, syncope, near syncope.  Denies orthopnea, PND, leg edema.  Past Medical History:  Diagnosis Date   Abnormal Pap smear of cervix    in her 20's-- hx of conization/LEEP procedure-normal paps since   Abnormal uterine bleeding    Anemia    ~2005   Arthritis    Atrial fibrillation (HCC)    Chest pain    Fibroid    GERD (gastroesophageal reflux disease)    Helicobacter pylori (H. pylori)    Psoriasis    Reflux    STD (sexually transmitted disease) 1994   Tx'd for Chlamydia   TMJ (temporomandibular joint syndrome)    Vision abnormalities    tunnel vision   Past Surgical History:  Procedure Laterality Date   cervix dilated     CHOLECYSTECTOMY N/A 09/13/2012   Procedure: LAPAROSCOPIC CHOLECYSTECTOMY WITH INTRAOPERATIVE CHOLANGIOGRAM;  Surgeon: Pedro Earls, MD;  Location: [facility];  Service: General;  Laterality: N/A;   DILATION AND CURETTAGE OF UTERUS     DILATION AND CURETTAGE, DIAGNOSTIC / [place]   with Conization   ESOPHAGOGASTRODUODENOSCOPY N/A 05/11/2012   Procedure: ESOPHAGOGASTRODUODENOSCOPY (EGD);  Surgeon: Rogene Houston, MD;  Location: [facility];  Service:  Endoscopy;  Laterality: N/A;  89   LEEP     Family History  Problem Relation Age of Onset   Diabetes Mother    Hypertension Father    Healthy Sister    Hypertension Sister    Hypertension Brother    Gout Brother    Heart attack Brother    Breast cancer Sister 39       lumpectomy   Sickle cell trait Brother    Diabetes Maternal Grandmother        with 2 BKA   Stroke Maternal Grandmother    Breast cancer Maternal Aunt     Social History   Tobacco Use   Smoking status: Former    Packs/day: 0.25    Years: 10.00    Additional pack years: 0.00    Total pack years: 2.50    Types: Cigarettes    Quit date: 06/20/1992    Years since quitting: 29.9    Passive exposure: Never   Smokeless tobacco: Never  Substance Use Topics   Alcohol use: Never   Marital Status: Single   ROS  Review of Systems  Cardiovascular:  Positive for dyspnea on exertion. Negative for chest pain and leg swelling.  Respiratory:  Positive for snoring (On CPAP and compliant).    Objective  Blood pressure 105/69, pulse 84, resp. rate 14, height 5' 5.5" (1.664 m), weight 266 lb 6.4 oz (120.8 kg), SpO2 98 %.     05/24/2022  4:02 PM 04/30/2022    1:47 PM 03/31/2022   11:38 AM  Vitals with BMI  Height 5' 5.5" 5' 5.5"   Weight 266 lbs 6 oz 273 lbs   BMI 123456 [id number]   Systolic 123456 [id number]  Diastolic 69 80 80  Pulse 84 65 57      Physical Exam Constitutional:      Appearance: She is morbidly obese.  Neck:     Vascular: Carotid bruit (Left) present. No JVD.  Cardiovascular:     Rate and Rhythm: Normal rate and regular rhythm.     Pulses: Intact distal pulses.     Heart sounds: Murmur heard.     Midsystolic murmur is present with a grade of 2/6 at the upper right sternal border radiating to the apex.     No gallop.  Pulmonary:     Effort: Pulmonary effort is normal.     Breath sounds: Normal breath sounds.  Abdominal:     General: Bowel sounds are normal.     Palpations: Abdomen is soft.   Musculoskeletal:     Right lower leg: No edema.     Left lower leg: No edema.    Laboratory examination:   Recent Labs    01/07/22 1109 01/25/22 1426 03/31/22 1136  NA 142 140 140  K 3.7 4.1 4.6  CL 100 104 104  CO2 28 29 30   GLUCOSE 95 230* 99  BUN 12 8 9   CREATININE 0.77 0.72 0.61  CALCIUM 9.4 8.8 9.6      Latest Ref Rng & Units 03/31/2022   11:36 AM 01/25/2022    2:26 PM 01/07/2022   11:09 AM  CMP  Glucose 65 - 99 mg/dL 99  230  95   BUN 7 - 25 mg/dL 9  8  12    Creatinine 0.50 - 1.05 mg/dL 0.61  0.72  0.77   Sodium 135 - 146 mmol/L 140  140  142   Potassium 3.5 - 5.3 mmol/L 4.6  4.1  3.7   Chloride 98 - 110 mmol/L 104  104  100   CO2 20 - 32 mmol/L 30  29  28    Calcium 8.6 - 10.4 mg/dL 9.6  8.8  9.4   Total Protein 6.1 - 8.1 g/dL 6.9  6.5  7.0   Total Bilirubin 0.2 - 1.2 mg/dL 0.3  0.3  0.3   Alkaline Phos 44 - 121 IU/L   97   AST 10 - 35 U/L 12  12  7    ALT 6 - 29 U/L 17  22  21        Latest Ref Rng & Units 03/31/2022   11:36 AM 01/25/2022    2:26 PM 01/07/2022   11:09 AM  CBC  WBC 3.8 - 10.8 Thousand/uL 6.0  7.6  13.4   Hemoglobin 11.7 - 15.5 g/dL 12.0  11.9  12.6   Hematocrit 35.0 - 45.0 % 36.3  36.8  37.3   Platelets 140 - 400 Thousand/uL 410  319  380     Lipid Panel Recent Labs    01/07/22 1109  CHOL 170  TRIG 79  LDLCALC 63  HDL 92  CHOLHDL 1.8     HEMOGLOBIN A1C Lab Results  Component Value Date   HGBA1C 7.3 (H) 01/07/2022   TSH Recent Labs    01/07/22 1109  TSH 1.130    External labs:  None  Allergies   Allergies  Allergen Reactions   Esomeprazole Magnesium  Patient states that the Nexium caused tremors     Final Medications at End of Visit     Current Outpatient Medications:    acetaminophen (TYLENOL) 325 MG tablet, Take 650 mg by mouth every 6 (six) hours as needed., Disp: , Rfl:    apixaban (ELIQUIS) 5 MG TABS tablet, Take 1 tablet (5 mg total) by mouth 2 (two) times daily., Disp: 60 tablet, Rfl: 3    cetirizine (ZYRTEC ALLERGY) 10 MG tablet, Take 1 tablet (10 mg total) by mouth daily., Disp: 90 tablet, Rfl: 1   clobetasol ointment (TEMOVATE) 0.05 %, APPLY TOPICALLY TO AFFECTED AREA ONCE DAILY UNTIL GONE, Disp: 15 g, Rfl: 0   dexamethasone 0.5 MG/5ML elixir, Take 0.5 mg by mouth 4 (four) times daily., Disp: , Rfl:    fluocinonide gel (LIDEX) 0.05 %, Apply topically 4 (four) times daily., Disp: , Rfl:    fluticasone (FLONASE) 50 MCG/ACT nasal spray, Place 2 sprays into both nostrils daily., Disp: 16 g, Rfl: 6   folic acid (FOLVITE) 1 MG tablet, Take 1 tablet by mouth once daily, Disp: 90 tablet, Rfl: 3   methotrexate (RHEUMATREX) 2.5 MG tablet, Take 10 tablets (25 mg total) by mouth once a week. CHEMOTHERAPY,  PROTECT  FROM  LIGHT, Disp: 120 tablet, Rfl: 0   metoprolol succinate (TOPROL-XL) 25 MG 24 hr tablet, Take 1 tablet (25 mg total) by mouth daily., Disp: 90 tablet, Rfl: 3   Semaglutide, 1 MG/DOSE, 4 MG/3ML SOPN, Inject 1 mg into the skin once a week., Disp: 3 mL, Rfl: 2   Radiology:   DG Chest Portable 1 View 07/02/2020 EKG leads project over the chest. Trachea midline. Cardiomediastinal contours are top normal to mildly enlarged, accentuated by portable technique. Mild central pulmonary vascular engorgement may be present. No consolidation. No sign of pleural effusion. Subtle opacities are seen in the RIGHT and LEFT mid chest. Also at the LEFT lung base along the LEFT heart border. On limited assessment no acute skeletal process.   IMPRESSION: Mild cardiac enlargement with signs of central pulmonary vascular engorgement. Subtle increased interstitial markings and vague opacities could also reflect asymmetric edema or viral infection. Consider PA and lateral chest radiograph to ensure resolution on follow-up.   Cardiac Studies:   PCV MYOCARDIAL PERFUSION WO LEXISCAN 08/06/2020 Normal ECG stress. The patient exercised for 3 minutes and 2 seconds of a Bruce protocol, achieving approximately  4.66 METs.  Markedly reduced exercise tolerance.  Symptoms of dyspnea.  Normal blood pressure response. Mild breast attenuation noted in the inferior wall in RAW images. Myocardial perfusion is normal. Overall LV systolic function is normal without regional wall motion abnormalities. Stress LV EF: 66%. No previous exam available for comparison. Low risk study.  Ambulatory cardiac telemetry 07/04/2020 - 07/18/2020: Predominant rhythm was sinus with maximum heart rate 40 bpm, maximum heart rate 179 bpm, average heart rate of 70 bpm.  Patient's symptoms correlated with normal sinus rhythm, PACs, and PVCs.  Patient on 48 episodes of supraventricular tachycardia with longest lasting 14.6 seconds and maximum heart rate 179 bpm.  Some episodes of SVT may be possible atrial tachycardia.  Rare PACs and PVCs.  Ventricular bigeminy noted.  No atrial fibrillation or flutter, high degree AV block, or ventricular tachycardia.  PCV ECHOCARDIOGRAM COMPLETE 05/21/2022 1. Left ventricle cavity is normal in size and wall thickness. Normal global wall motion. Normal LV systolic function with EF 57%. Normal diastolic filling pattern. 2. No significant valvular abnormality. 3. Normal right atrial pressure. 4.  Previous study on 07/18/2020 reported grade 1 diastolic dysfunction  EKG:   EKG 05/24/2022: Normal sinus rhythm at rate of 84 bpm, normal axis, incomplete right bundle branch block.  Normal EKG.  No significant change compared to 03/31/2022.  Assessment     ICD-10-CM   1. Paroxysmal atrial fibrillation (HCC)  I48.0 EKG 12-Lead    2. Primary hypertension  I10     3. Left carotid bruit  R09.89     4. Class 3 severe obesity due to excess calories with serious comorbidity and body mass index (BMI) of 40.0 to 44.9 in adult (HCC)  E66.01    Z68.41     5. Type 2 diabetes mellitus with hyperglycemia, without long-term current use of insulin (HCC)  E11.65     This patients CHA2DS2-VASc Score 2 (F, DM) and yearly  risk of stroke 2.2%.   Medications Discontinued During This Encounter  Medication Reason   albuterol (VENTOLIN HFA) 108 (90 Base) MCG/ACT inhaler    Polyethylene Glycol 3350 (MIRALAX PO) Dose change   Semaglutide(0.25 or 0.5MG /DOS) SOPN 0.25 mg Dose change     No orders of the defined types were placed in this encounter.  Subcu semaglutide 0.25 mg dose administered to the patient, educated her about using the medication, discussed the side effects, patient is comfortable.  Recommendations:   Maria Winters is a 62 y.o.   African-American obese female with history of diabetes, hypertension, hyperlipidemia, morbid obesity and OSA on CPAP and compliant, psoriasis of the skin and also seropositive rheumatoid arthritis being followed by rheumatology and presently on long-term methotrexate presents for 34-month office visit, diagnosed with PAF in April 2022 when she presented to the emergency room and has been on anticoagulation with Eliquis since then.  This is a 6-week office visit for follow-up of initiation of Ozempic.  Echocardiogram was also ordered to follow-up on dyspnea.  1. Paroxysmal atrial fibrillation (HCC) Patient is presently sinus rhythm, EKG is normal.  In view of morbid obesity, OSA on CPAP, she is certainly at high risk for recurrence of atrial fibrillation and although chads vascular score is low and she has not had any recurrence since April 2022 that is documented for atrial fibrillation and she remains asymptomatic.  As she has started to lose weight, I encouraged her that if she continues to lose weight and gets her BMI down to <35 and she maintains sinus rhythm, blood pressure is controlled, she could potentially come off of anticoagulation and I would prefer her weight to be around 30-32 BMI.  2. Dyspnea on exertion Dyspnea on exertion is related to underlying obesity and obesity hypoventilation.  Do not suspect pulmonary hypertension, reviewed the results of the  echocardiogram which reveals no evidence of RV strain.  Lung examination is clear.  Do not suspect IPF.  She has already started feeling much better since she has lost weight since starting Ozempic.  She is excited about this.  3. Left carotid bruit She has a soft left carotid bruit,  carotid artery duplex has been ordered and we will follow-up on this.  4.  Class 3 severe obesity due to excess calories with serious comorbidity and body mass index (BMI) of 40.0 to 44.9 in adult [facility]) On her last office visit I had started her on Ozempic which she is tolerating, she has now escalated the dose to 1 mg every week. In 6 weeks she has lost approximately 6 to 8 pounds.  She has noticed overall improvement in wellbeing.  She is now on 1 mg dose.  I will request her PCP to see whether she can take over the prescription, she may need to escalate the dose to 2 mg depending upon her response.  I am hoping that her diabetes will also improve.  5. Type 2 diabetes mellitus with hyperglycemia, without long-term current use of insulin ([place]) I am expecting her diabetes to improve as well on Ozempic.  Office visit in 6 months, if she has truly lost significant amount of weight and maintain sinus rhythm, can consider discontinuing Eliquis.     Adrian Prows, MD, [facility] 05/24/2022, 4:21 PM Office: [phone] Fax: [phone] Pager: [phone]

## 2022-06-03 ENCOUNTER — Ambulatory Visit: Payer: BC Managed Care – PPO | Admitting: Family

## 2022-06-03 ENCOUNTER — Encounter: Payer: Self-pay | Admitting: Family

## 2022-06-03 VITALS — BP 106/62 | HR 77 | Temp 97.2°F | Ht 65.5 in | Wt 264.0 lb

## 2022-06-03 DIAGNOSIS — E559 Vitamin D deficiency, unspecified: Secondary | ICD-10-CM

## 2022-06-03 DIAGNOSIS — F411 Generalized anxiety disorder: Secondary | ICD-10-CM

## 2022-06-03 DIAGNOSIS — E1169 Type 2 diabetes mellitus with other specified complication: Secondary | ICD-10-CM | POA: Diagnosis not present

## 2022-06-03 DIAGNOSIS — K219 Gastro-esophageal reflux disease without esophagitis: Secondary | ICD-10-CM | POA: Diagnosis not present

## 2022-06-03 DIAGNOSIS — G4733 Obstructive sleep apnea (adult) (pediatric): Secondary | ICD-10-CM

## 2022-06-03 DIAGNOSIS — K5900 Constipation, unspecified: Secondary | ICD-10-CM | POA: Diagnosis not present

## 2022-06-03 DIAGNOSIS — I480 Paroxysmal atrial fibrillation: Secondary | ICD-10-CM

## 2022-06-03 DIAGNOSIS — Z79899 Other long term (current) drug therapy: Secondary | ICD-10-CM

## 2022-06-03 DIAGNOSIS — M059 Rheumatoid arthritis with rheumatoid factor, unspecified: Secondary | ICD-10-CM

## 2022-06-03 LAB — BAYER DCA HB A1C WAIVED: HB A1C (BAYER DCA - WAIVED): 6.2 % — ABNORMAL HIGH (ref 4.8–5.6)

## 2022-06-03 MED ORDER — SEMAGLUTIDE (2 MG/DOSE) 8 MG/3ML ~~LOC~~ SOPN: 2.0000 mg | PEN_INJECTOR | SUBCUTANEOUS | 2 refills | Status: DC

## 2022-06-03 NOTE — Patient Instructions (Signed)

## 2022-06-03 NOTE — Progress Notes (Signed)
Subjective:    Patient ID: Maria Winters, female    DOB: 07-22-1960, 62 y.o.   MRN: [id number]  Chief Complaint  Patient presents with   Medical Management of Chronic Issues    Should she move up to 2 mg on ozempic    Pt calls the office today for chronic follow up.  She is following a Cardiologists for A Fib, and intermittent palpitations. She is taking Eliquis BID.    She is followed by Rheumatologists every 3 months for RA.     She has OSA and uses CPAP nightly.  Gastroesophageal Reflux She complains of belching and heartburn. This is a chronic problem. The current episode started more than 1 year ago. The problem occurs occasionally. Risk factors include obesity. She has tried a PPI for the symptoms. The treatment provided moderate relief.  Diabetes She presents for her follow-up diabetic visit. She has type 2 diabetes mellitus. Hypoglycemia symptoms include nervousness/anxiousness. Pertinent negatives for diabetes include no blurred vision and no foot paresthesias. Symptoms are stable. Risk factors for coronary artery disease include dyslipidemia, diabetes mellitus, hypertension and sedentary lifestyle. Her overall blood glucose range is 110-130 mg/dl. Eye exam is current.  Constipation This is a chronic problem. The current episode started more than 1 year ago. Her stool frequency is 1 time per day. Risk factors include obesity. She has tried laxatives for the symptoms. The treatment provided moderate relief.  Anxiety Presents for follow-up visit. Symptoms include excessive worry, nervous/anxious behavior and restlessness. Symptoms occur occasionally. The severity of symptoms is moderate.        Review of Systems  Eyes:  Negative for blurred vision.  Gastrointestinal:  Positive for constipation and heartburn.  Psychiatric/Behavioral:  The patient is nervous/anxious.   All other systems reviewed and are negative.      Objective:   Physical Exam Vitals reviewed.   Constitutional:      General: She is not in acute distress.    Appearance: She is well-developed. She is obese.  HENT:     Head: Normocephalic and atraumatic.     Right Ear: Tympanic membrane normal.     Left Ear: Tympanic membrane normal.  Eyes:     Pupils: Pupils are equal, round, and reactive to light.  Neck:     Thyroid: No thyromegaly.  Cardiovascular:     Rate and Rhythm: Normal rate and regular rhythm.     Heart sounds: Normal heart sounds. No murmur heard. Pulmonary:     Effort: Pulmonary effort is normal. No respiratory distress.     Breath sounds: Normal breath sounds. No wheezing.  Abdominal:     General: Bowel sounds are normal. There is no distension.     Palpations: Abdomen is soft.     Tenderness: There is no abdominal tenderness.  Musculoskeletal:        General: No tenderness. Normal range of motion.     Cervical back: Normal range of motion and neck supple.  Skin:    General: Skin is warm and dry.  Neurological:     Mental Status: She is alert and oriented to person, place, and time.     Cranial Nerves: No cranial nerve deficit.     Deep Tendon Reflexes: Reflexes are normal and symmetric.  Psychiatric:        Behavior: Behavior normal.        Thought Content: Thought content normal.        Judgment: Judgment normal.     BP  106/62   Pulse 77   Temp (!) 97.2 F (36.2 C) (Temporal)   Ht 5' 5.5" (1.664 m)   Wt 264 lb (119.7 kg)   SpO2 95%   BMI 43.26 kg/m        Assessment & Plan:  Maria Winters comes in today with chief complaint of Medical Management of Chronic Issues (Should she move up to 2 mg on ozempic )   Diagnosis and orders addressed:  1. Gastroesophageal reflux disease without esophagitis - CMP14+EGFR  2. Type 2 diabetes mellitus with other specified complication, without long-term current use of insulin (HCC) Will increase Ozempic to 2 mg from 1 mg  - Semaglutide, 2 MG/DOSE, 8 MG/3ML SOPN; Inject 2 mg as directed once a week.   Dispense: 3 mL; Refill: 2 - Bayer DCA Hb A1c Waived - CMP14+EGFR  3. Anxiety state - CMP14+EGFR  4. Constipation, unspecified constipation type - CMP14+EGFR  5. Seropositive rheumatoid arthritis (HCC) - CMP14+EGFR  6. Vitamin D deficiency - CMP14+EGFR  7. OSA (obstructive sleep apnea) - CMP14+EGFR  8. Paroxysmal atrial fibrillation (HCC) - CMP14+EGFR  9. Morbid obesity ([place]) - CMP14+EGFR  10. High risk medication use - CMP14+EGFR  Labs pending Health Maintenance reviewed Diet and exercise encouraged  Follow up plan: 6 months    Evelina Dun, FNP

## 2022-06-04 LAB — CMP14+EGFR
ALT: 15 IU/L (ref 0–32)
AST: 11 IU/L (ref 0–40)
Albumin/Globulin Ratio: 1.6 (ref 1.2–2.2)
Albumin: 3.9 g/dL (ref 3.9–4.9)
Alkaline Phosphatase: 84 IU/L (ref 44–121)
BUN/Creatinine Ratio: 5 — ABNORMAL LOW (ref 12–28)
BUN: 3 mg/dL — ABNORMAL LOW (ref 8–27)
Bilirubin Total: 0.3 mg/dL (ref 0.0–1.2)
CO2: 25 mmol/L (ref 20–29)
Calcium: 9.1 mg/dL (ref 8.7–10.3)
Chloride: 103 mmol/L (ref 96–106)
Creatinine, Ser: 0.66 mg/dL (ref 0.57–1.00)
Globulin, Total: 2.5 g/dL (ref 1.5–4.5)
Glucose: 107 mg/dL — ABNORMAL HIGH (ref 70–99)
Potassium: 4.3 mmol/L (ref 3.5–5.2)
Sodium: 141 mmol/L (ref 134–144)
Total Protein: 6.4 g/dL (ref 6.0–8.5)
eGFR: 99 mL/min/{1.73_m2} (ref >59)

## 2022-06-08 ENCOUNTER — Ambulatory Visit: Payer: BC Managed Care – PPO

## 2022-06-08 DIAGNOSIS — R0989 Other specified symptoms and signs involving the circulatory and respiratory systems: Secondary | ICD-10-CM

## 2022-06-19 ENCOUNTER — Other Ambulatory Visit: Payer: Self-pay | Admitting: Internal Medicine

## 2022-06-19 DIAGNOSIS — M059 Rheumatoid arthritis with rheumatoid factor, unspecified: Secondary | ICD-10-CM

## 2022-06-21 NOTE — Telephone Encounter (Signed)
Last Fill: 07/30/2021  Next Visit: 07/12/2022  Last Visit: 03/31/2022  Dx: Seropositive rheumatoid arthritis   Current Dose per office note on 03/31/2022: folic acid 1 mg daily.   Okay to refill Folic Acid?

## 2022-06-26 ENCOUNTER — Other Ambulatory Visit: Payer: Self-pay | Admitting: Cardiology

## 2022-06-30 ENCOUNTER — Other Ambulatory Visit: Payer: Self-pay | Admitting: Internal Medicine

## 2022-06-30 ENCOUNTER — Ambulatory Visit: Payer: BC Managed Care – PPO | Admitting: Internal Medicine

## 2022-06-30 DIAGNOSIS — M059 Rheumatoid arthritis with rheumatoid factor, unspecified: Secondary | ICD-10-CM

## 2022-06-30 NOTE — Telephone Encounter (Signed)
Last Fill: 04/02/2022  Labs: 03/31/2022 CBC: Lab results show no problem for the methotrexate. Her sedimentation rate and CRP remain significantly elevated consistent with active inflammation. She can try the methotrexate at the increased dose and monitor symptoms. Hopefully also sees improvement with starting the ozempic.   06/03/2022 CMP: Glucose 107, BUN 3, BUN/Creatinine Ratio 5.  Next Visit: 07/12/2022  Last Visit: 03/31/2022  DX: Seropositive rheumatoid arthritis   Current Dose per office note 03/31/2022: methotrexate 25 mg p.o. weekly   Okay to refill Methotrexate?

## 2022-07-11 NOTE — Progress Notes (Deleted)
Office Visit Note  Patient: Maria Winters             Date of Birth: [birth year]           MRN: [id number]             PCP: Junie Spencer, FNP Referring: Junie Spencer, FNP Visit Date: 07/12/2022   Subjective:  No chief complaint on file.   History of Present Illness: Maria Winters is a 62 y.o. female here for follow up ***   Previous HPI 03/31/22 Maria Winters is a 62 y.o. female here for follow up of rheumatoid arthritis on methotrexate 20 mg p.o. weekly and folic acid 1 mg daily.  She has increased problems since a flareup started in her right foot on January 9.  She experiences migratory joint inflammation mostly in 1 joint at a time starting in the right foot proceeded to eventually involve both feet both hands and both shoulders each spot lasting for about 2 or 3 days at a time.  Currently symptoms are improved again without any swollen area.  She did not recall any kind of illness injury or change in activity prior to this episode.  She has not been taking any specific medication for but is the spending all weekend resting and elevating her feet but symptoms worsening and during the week while having to be more active.       Previous HPI 01/25/22 Maria Winters is a 62 y.o. female here for follow up for seropositive RA  on MTX 15 mg PO weekly and folic acid 1 mg daily.  She has been mostly doing well since her last visit without significant daily peripheral joint pains or swelling.  Does get pain in her knees somewhat limiting for prolonged walking standing and low back pains in a fixed seated position.  She is noticing pain in her feet on both sides most often after driving.  Had some episodic increase in pain at her hands and elbows improving on their own within about 2 weeks.  Also recently went to the emergency department a few weeks ago due to onset of left-sided jaw pain.  Evaluation was reassuring against cardiac etiology thought to be temporomandibular  joint pain.  She completed a steroid taper and low-dose hydrocodone symptoms have resolved.   Previous HPI 10/20/2021 Maria Winters is a 62 y.o. female here for follow up for seropositive RA on methotrexate 15 mg p.o. weekly.  She seen a sizable improvement in much of her joint symptoms and has not experienced any trouble taking the medication.  She is having some pain in the right hip most often occurred when sitting in 1 position for prolonged time usually about 30 minutes or longer provokes this.  Otherwise joint problems are mostly doing better and she is not seeing any ongoing swelling changes.   Previous HPI  06/22/2021 Maria Winters is a 62 y.o. female here for follow up for seropositive RA after starting methotrexate 15 mg PO weekly. Also increase vitamin D supplementation to 5000 units daily. She misunderstood instructions so started taking methotrexate only 1 tablet weekly for 2 weeks then increased to 6 tablets as planned for the past 2 weeks. She has some diarrhea with the medication but not severe or lasting continuously. She had increased in bilateral heel pain worst when getting up to walk initially, the left side has improved but right side ongoing symptoms. Also now for about 3 days has pain  in her left side around the flank or middle and low back. This radiates around to the front. No dysuria, frequency, or hematuria reported. She does not recall any injury. No history of kidney stones or kidney infections.   Previous HPI 05/07/21 Maria Winters is a 62 y.o. female here for follow up for new seropositive RA labs at initial visit negative screening for hepatitis and TB her CCP is highly positive and vitamin D low at 15. Xrays showed mild osteoarthritis in the feet otherwise unremarkable. She continues having significant joint pain symptoms affected hands and feet and fatigue. She notices more difficulty performing tasks at work such as pulling drawer handles or turning door knobs  with pain or poor grip strength.   Previous HPI 04/21/21 Maria Winters is a 62 y.o. female here for evaluation of joint pain in multiple areas with elevated RF and sed rate. She reports joint pain starting since about a month ago. Pain is affecting many areas including shoulders, elbows, wrists, fingers, hips, knees, and ankles. She has some night time awakenings due to hand pain. Decreased grip strength is affecting her daily activities including typing and driving. Swelling is most apparent in her hands and on the top of her feet around the base of the toes. She is stiff for about 3 hours each morning before getting some improvement in her mobility. She is not able to take NSAIDs due to anticoagulation for Afib she takes tylenol which helps but has to repeat this about every 8 hours for symptoms returning.   Labs reviewed 03/2021 RF 494 ESR 70   No Rheumatology ROS completed.   PMFS History:  Patient Active Problem List   Diagnosis Date Noted   OSA (obstructive sleep apnea) 11/27/2021   Morbid obesity (HCC) 05/11/2021   Paroxysmal atrial fibrillation (HCC) 05/11/2021   Intermittent palpitations 05/11/2021   Excessive daytime sleepiness 05/11/2021   At risk for central sleep apnea 05/11/2021   Seropositive rheumatoid arthritis (HCC) 04/21/2021   Vitamin D deficiency 04/21/2021   High risk medication use 04/21/2021   Myalgia 03/20/2021   Vertigo 07/23/2019   Constipation 07/23/2019   Anxiety state 03/18/2016   Migraine equivalent 03/18/2016   Type 2 diabetes mellitus with other specified complication (HCC) 06/06/2015   S/P lap cholecystectomy July 2014 09/14/2012   GERD (gastroesophageal reflux disease) 04/28/2012    Past Medical History:  Diagnosis Date   Abnormal Pap smear of cervix    in her 20's-- hx of conization/LEEP procedure-normal paps since   Abnormal uterine bleeding    Anemia    ~2005   Arthritis    Atrial fibrillation (HCC)    Chest pain    Fibroid    GERD  (gastroesophageal reflux disease)    Helicobacter pylori (H. pylori)    Psoriasis    Reflux    STD (sexually transmitted disease) 1994   Tx'd for Chlamydia   TMJ (temporomandibular joint syndrome)    Vision abnormalities    tunnel vision    Family History  Problem Relation Age of Onset   Diabetes Mother    Hypertension Father    Healthy Sister    Hypertension Sister    Hypertension Brother    Gout Brother    Heart attack Brother    Breast cancer Sister 55       lumpectomy   Sickle cell trait Brother    Diabetes Maternal Grandmother        with 2 BKA   Stroke Maternal  Grandmother    Breast cancer Maternal Aunt    Past Surgical History:  Procedure Laterality Date   cervix dilated     CHOLECYSTECTOMY N/A 09/13/2012   Procedure: LAPAROSCOPIC CHOLECYSTECTOMY WITH INTRAOPERATIVE CHOLANGIOGRAM;  Surgeon: Valarie Merino, MD;  Location: [facility];  Service: General;  Laterality: N/A;   DILATION AND CURETTAGE OF UTERUS     DILATION AND CURETTAGE, DIAGNOSTIC / THERAPEUTIC  1989   with Conization   ESOPHAGOGASTRODUODENOSCOPY N/A 05/11/2012   Procedure: ESOPHAGOGASTRODUODENOSCOPY (EGD);  Surgeon: Malissa Hippo, MD;  Location: [facility];  Service: Endoscopy;  Laterality: N/A;  125   LEEP     Social History   Social History Narrative   Not on file   Immunization History  Administered Date(s) Administered   Influenza Split 04/19/2012   Influenza,inj,Quad PF,6+ Mos 01/24/2015, 12/23/2017, 12/07/2019, 03/20/2021, 01/07/2022   PFIZER(Purple Top)SARS-COV-2 Vaccination 06/16/2019, 07/07/2019, 01/26/2020   Pneumococcal Conjugate-13 08/31/2016   Pneumococcal Polysaccharide-23 12/07/2019   Tdap 08/31/2016   Zoster Recombinat (Shingrix) 03/06/2019, 05/18/2019     Objective: Vital Signs: There were no vitals taken for this visit.   Physical Exam   Musculoskeletal Exam: ***  CDAI Exam: CDAI Score: -- Patient Global: --; Provider Global: -- Swollen: --; Tender: -- Joint Exam  07/12/2022   No joint exam has been documented for this visit   There is currently no information documented on the homunculus. Go to the Rheumatology activity and complete the homunculus joint exam.  Investigation: No additional findings.  Imaging: No results found.  Recent Labs: Lab Results  Component Value Date   WBC 6.0 03/31/2022   HGB 12.0 03/31/2022   PLT 410 (H) 03/31/2022   NA 141 06/03/2022   K 4.3 06/03/2022   CL 103 06/03/2022   CO2 25 06/03/2022   GLUCOSE 107 (H) 06/03/2022   BUN 3 (L) 06/03/2022   CREATININE 0.66 06/03/2022   BILITOT 0.3 06/03/2022   ALKPHOS 84 06/03/2022   AST 11 06/03/2022   ALT 15 06/03/2022   PROT 6.4 06/03/2022   ALBUMIN 3.9 06/03/2022   CALCIUM 9.1 06/03/2022   GFRAA 83 08/24/2019   QFTBGOLDPLUS NEGATIVE 04/21/2021    Speciality Comments: No specialty comments available.  Procedures:  No procedures performed Allergies: Esomeprazole magnesium   Assessment / Plan:     Visit Diagnoses: No diagnosis found.  ***  Orders: No orders of the defined types were placed in this encounter.  No orders of the defined types were placed in this encounter.    Follow-Up Instructions: No follow-ups on file.   Fuller Plan, MD  Note - This record has been created using [employer].  Chart creation errors have been sought, but may not always  have been located. Such creation errors do not reflect on  the standard of medical care.

## 2022-07-12 ENCOUNTER — Ambulatory Visit: Payer: BC Managed Care – PPO | Admitting: Internal Medicine

## 2022-07-27 ENCOUNTER — Other Ambulatory Visit: Payer: Self-pay | Admitting: Internal Medicine

## 2022-07-27 DIAGNOSIS — M059 Rheumatoid arthritis with rheumatoid factor, unspecified: Secondary | ICD-10-CM

## 2022-07-28 ENCOUNTER — Ambulatory Visit: Payer: Medicaid Other | Attending: Internal Medicine | Admitting: Internal Medicine

## 2022-07-28 ENCOUNTER — Encounter: Payer: Self-pay | Admitting: Internal Medicine

## 2022-07-28 ENCOUNTER — Other Ambulatory Visit: Payer: Self-pay | Admitting: Cardiology

## 2022-07-28 VITALS — BP 104/72 | HR 88 | Resp 12 | Ht 65.5 in | Wt 245.0 lb

## 2022-07-28 DIAGNOSIS — G4719 Other hypersomnia: Secondary | ICD-10-CM | POA: Diagnosis not present

## 2022-07-28 DIAGNOSIS — E1165 Type 2 diabetes mellitus with hyperglycemia: Secondary | ICD-10-CM

## 2022-07-28 DIAGNOSIS — M059 Rheumatoid arthritis with rheumatoid factor, unspecified: Secondary | ICD-10-CM | POA: Diagnosis not present

## 2022-07-28 DIAGNOSIS — Z79899 Other long term (current) drug therapy: Secondary | ICD-10-CM | POA: Diagnosis not present

## 2022-07-28 MED ORDER — METHOTREXATE SODIUM 2.5 MG PO TABS: 25.0000 mg | ORAL_TABLET | ORAL | 0 refills | Status: DC

## 2022-07-28 NOTE — Progress Notes (Signed)
Office Visit Note  Patient: Maria Winters             Date of Birth: 30-Jun-1960           MRN: [id number]             PCP: Junie Spencer, FNP Referring: Junie Spencer, FNP Visit Date: 07/28/2022   Subjective:  Follow-up   History of Present Illness: Maria Winters is a 62 y.o. female here for follow up for seropositive RA on methotrexate increased to 25 mg p.o. weekly since last visit and on folic acid 1 mg daily.  She had several events since her last visit she officially retired and so had a transition in insurance coverage which delayed her scheduling this follow-up appointment.  She was sick with upper respiratory infection symptoms and had a positive COVID test in February took molnupiravir for this.  She is now on semaglutide injection and has worked on cutting out sweets and caffeine from her diet as well as consistent daily exercise and nighttime use of CPAP.  Overall she feels very well with less fatigue and inflammation than she has had in the past several years.  Previous HPI 03/31/22 Maria Winters is a 62 y.o. female here for follow up of rheumatoid arthritis on methotrexate 20 mg p.o. weekly and folic acid 1 mg daily.  She has increased problems since a flareup started in her right foot on January 9.  She experiences migratory joint inflammation mostly in 1 joint at a time starting in the right foot proceeded to eventually involve both feet both hands and both shoulders each spot lasting for about 2 or 3 days at a time.  Currently symptoms are improved again without any swollen area.  She did not recall any kind of illness injury or change in activity prior to this episode.  She has not been taking any specific medication for but is the spending all weekend resting and elevating her feet but symptoms worsening and during the week while having to be more active.   Previous HPI 01/25/22 Maria Winters is a 62 y.o. female here for follow up for seropositive RA   on MTX 15 mg PO weekly and folic acid 1 mg daily.  She has been mostly doing well since her last visit without significant daily peripheral joint pains or swelling.  Does get pain in her knees somewhat limiting for prolonged walking standing and low back pains in a fixed seated position.  She is noticing pain in her feet on both sides most often after driving.  Had some episodic increase in pain at her hands and elbows improving on their own within about 2 weeks.  Also recently went to the emergency department a few weeks ago due to onset of left-sided jaw pain.  Evaluation was reassuring against cardiac etiology thought to be temporomandibular joint pain.  She completed a steroid taper and low-dose hydrocodone symptoms have resolved.   Previous HPI 10/20/2021 Maria Winters is a 62 y.o. female here for follow up for seropositive RA on methotrexate 15 mg p.o. weekly.  She seen a sizable improvement in much of her joint symptoms and has not experienced any trouble taking the medication.  She is having some pain in the right hip most often occurred when sitting in 1 position for prolonged time usually about 30 minutes or longer provokes this.  Otherwise joint problems are mostly doing better and she is not seeing any ongoing swelling  changes.   Previous HPI  06/22/2021 Maria Winters is a 62 y.o. female here for follow up for seropositive RA after starting methotrexate 15 mg PO weekly. Also increase vitamin D supplementation to 5000 units daily. She misunderstood instructions so started taking methotrexate only 1 tablet weekly for 2 weeks then increased to 6 tablets as planned for the past 2 weeks. She has some diarrhea with the medication but not severe or lasting continuously. She had increased in bilateral heel pain worst when getting up to walk initially, the left side has improved but right side ongoing symptoms. Also now for about 3 days has pain in her left side around the flank or middle and low  back. This radiates around to the front. No dysuria, frequency, or hematuria reported. She does not recall any injury. No history of kidney stones or kidney infections.   Previous HPI 05/07/21 Maria Winters is a 62 y.o. female here for follow up for new seropositive RA labs at initial visit negative screening for hepatitis and TB her CCP is highly positive and vitamin D low at 15. Xrays showed mild osteoarthritis in the feet otherwise unremarkable. She continues having significant joint pain symptoms affected hands and feet and fatigue. She notices more difficulty performing tasks at work such as pulling drawer handles or turning door knobs with pain or poor grip strength.   Previous HPI 04/21/21 Maria Winters is a 62 y.o. female here for evaluation of joint pain in multiple areas with elevated RF and sed rate. She reports joint pain starting since about a month ago. Pain is affecting many areas including shoulders, elbows, wrists, fingers, hips, knees, and ankles. She has some night time awakenings due to hand pain. Decreased grip strength is affecting her daily activities including typing and driving. Swelling is most apparent in her hands and on the top of her feet around the base of the toes. She is stiff for about 3 hours each morning before getting some improvement in her mobility. She is not able to take NSAIDs due to anticoagulation for Afib she takes tylenol which helps but has to repeat this about every 8 hours for symptoms returning.   Labs reviewed 03/2021 RF 494 ESR 70   Review of Systems  Constitutional:  Negative for fatigue.  HENT:  Negative for mouth sores and mouth dryness.   Eyes:  Positive for dryness.  Respiratory:  Negative for shortness of breath.   Cardiovascular:  Negative for chest pain and palpitations.  Gastrointestinal:  Negative for blood in stool, constipation and diarrhea.  Endocrine: Negative for increased urination.  Genitourinary:  Negative for  involuntary urination.  Musculoskeletal:  Negative for joint pain, gait problem, joint pain, joint swelling, myalgias, muscle weakness, morning stiffness, muscle tenderness and myalgias.  Skin:  Negative for color change, rash, hair loss and sensitivity to sunlight.  Allergic/Immunologic: Negative for susceptible to infections.  Neurological:  Negative for dizziness and headaches.  Hematological:  Negative for swollen glands.  Psychiatric/Behavioral:  Negative for depressed mood and sleep disturbance. The patient is not nervous/anxious.     PMFS History:  Patient Active Problem List   Diagnosis Date Noted   OSA (obstructive sleep apnea) 11/27/2021   Morbid obesity (HCC) 05/11/2021   Paroxysmal atrial fibrillation (HCC) 05/11/2021   Intermittent palpitations 05/11/2021   Excessive daytime sleepiness 05/11/2021   At risk for central sleep apnea 05/11/2021   Seropositive rheumatoid arthritis (HCC) 04/21/2021   Vitamin D deficiency 04/21/2021   High risk  medication use 04/21/2021   Myalgia 03/20/2021   Vertigo 07/23/2019   Constipation 07/23/2019   Anxiety state 03/18/2016   Migraine equivalent 03/18/2016   Type 2 diabetes mellitus with other specified complication (HCC) 06/06/2015   S/P lap cholecystectomy July 2014 09/14/2012   GERD (gastroesophageal reflux disease) 04/28/2012    Past Medical History:  Diagnosis Date   Abnormal Pap smear of cervix    in her 20's-- hx of conization/LEEP procedure-normal paps since   Abnormal uterine bleeding    Anemia    ~2005   Arthritis    Atrial fibrillation (HCC)    Chest pain    Fibroid    GERD (gastroesophageal reflux disease)    Helicobacter pylori (H. pylori)    Psoriasis    Reflux    STD (sexually transmitted disease) 1994   Tx'd for Chlamydia   TMJ (temporomandibular joint syndrome)    Vision abnormalities    tunnel vision    Family History  Problem Relation Age of Onset   Diabetes Mother    Hypertension Father    Healthy  Sister    Hypertension Sister    Hypertension Brother    Gout Brother    Heart attack Brother    Breast cancer Sister 30       lumpectomy   Sickle cell trait Brother    Diabetes Maternal Grandmother        with 2 BKA   Stroke Maternal Grandmother    Breast cancer Maternal Aunt    Past Surgical History:  Procedure Laterality Date   cervix dilated     CHOLECYSTECTOMY N/A 09/13/2012   Procedure: LAPAROSCOPIC CHOLECYSTECTOMY WITH INTRAOPERATIVE CHOLANGIOGRAM;  Surgeon: Valarie Merino, MD;  Location: [facility];  Service: General;  Laterality: N/A;   DILATION AND CURETTAGE OF UTERUS     DILATION AND CURETTAGE, DIAGNOSTIC / THERAPEUTIC  1989   with Conization   ESOPHAGOGASTRODUODENOSCOPY N/A 05/11/2012   Procedure: ESOPHAGOGASTRODUODENOSCOPY (EGD);  Surgeon: Malissa Hippo, MD;  Location: [facility];  Service: Endoscopy;  Laterality: N/A;  125   LEEP     Social History   Social History Narrative   Not on file   Immunization History  Administered Date(s) Administered   Influenza Split 04/19/2012   Influenza,inj,Quad PF,6+ Mos 01/24/2015, 12/23/2017, 12/07/2019, 03/20/2021, 01/07/2022   PFIZER(Purple Top)SARS-COV-2 Vaccination 06/16/2019, 07/07/2019, 01/26/2020   Pneumococcal Conjugate-13 08/31/2016   Pneumococcal Polysaccharide-23 12/07/2019   Tdap 08/31/2016   Zoster Recombinat (Shingrix) 03/06/2019, 05/18/2019     Objective: Vital Signs: BP 104/72 (BP Location: Left Arm, Patient Position: Sitting, Cuff Size: Normal)   Pulse 88   Resp 12   Ht 5' 5.5" (1.664 m)   Wt 245 lb (111.1 kg)   LMP 03/06/2021 (Exact Date)   BMI 40.15 kg/m    Physical Exam Constitutional:      Appearance: She is obese.  Eyes:     Conjunctiva/sclera: Conjunctivae normal.  Cardiovascular:     Rate and Rhythm: Normal rate and regular rhythm.  Pulmonary:     Effort: Pulmonary effort is normal.     Breath sounds: Normal breath sounds.  Musculoskeletal:     Right lower leg: No edema.     Left  lower leg: No edema.  Lymphadenopathy:     Cervical: No cervical adenopathy.  Skin:    General: Skin is warm and dry.     Findings: No rash.  Neurological:     Mental Status: She is alert.  Psychiatric:  Mood and Affect: Mood normal.      Musculoskeletal Exam:  Shoulders full ROM no tenderness or swelling Elbows full ROM no tenderness or swelling Wrists full ROM no tenderness or swelling Fingers full ROM no tenderness or swelling Knees full ROM no tenderness or swelling, right knee slight patellofemoral crepitus present Ankles full ROM no tenderness or swelling MTPs full ROM no tenderness or swelling   CDAI Exam: CDAI Score: 1  Patient Global: 10 mm; Provider Global: 0 mm Swollen: 0 ; Tender: 0  Joint Exam 07/28/2022   All documented joints were normal     Investigation: No additional findings.  Imaging: No results found.  Recent Labs: Lab Results  Component Value Date   WBC 6.0 03/31/2022   HGB 12.0 03/31/2022   PLT 410 (H) 03/31/2022   NA 141 06/03/2022   K 4.3 06/03/2022   CL 103 06/03/2022   CO2 25 06/03/2022   GLUCOSE 107 (H) 06/03/2022   BUN 3 (L) 06/03/2022   CREATININE 0.66 06/03/2022   BILITOT 0.3 06/03/2022   ALKPHOS 84 06/03/2022   AST 11 06/03/2022   ALT 15 06/03/2022   PROT 6.4 06/03/2022   ALBUMIN 3.9 06/03/2022   CALCIUM 9.1 06/03/2022   GFRAA 83 08/24/2019   QFTBGOLDPLUS NEGATIVE 04/21/2021    Speciality Comments: No specialty comments available.  Procedures:  No procedures performed Allergies: Esomeprazole magnesium   Assessment / Plan:     Visit Diagnoses: Seropositive rheumatoid arthritis (HCC) - Plan: Sedimentation rate, methotrexate (RHEUMATREX) 2.5 MG tablet  Inflammatory arthritis appears well-controlled on current treatment.  She describes some mild symptoms such as left foot pain during weather changes more consistent with mild generalized osteoarthritis.  Will recheck sed rate for disease activity monitoring.   Plan to continue methotrexate 25 mg p.o. weekly folic acid 1 mg daily.  High risk medication use - Plan: CBC with Differential/Platelet, COMPLETE METABOLIC PANEL WITH GFR  Checking CBC and CMP for medication monitoring on increased dose of methotrexate.  She was sick with COVID without major complications no other serious interval infections.  Excessive daytime sleepiness  Fatigue and daytime sleepiness improving with increased exercise improved with diet and the CPAP. Discussed lifestyle modifications often can improve inflammatory disease activity as well.  Orders: Orders Placed This Encounter  Procedures   Sedimentation rate   CBC with Differential/Platelet   COMPLETE METABOLIC PANEL WITH GFR   Meds ordered this encounter  Medications   methotrexate (RHEUMATREX) 2.5 MG tablet    Sig: Take 10 tablets (25 mg total) by mouth once a week. Caution:Chemotherapy. Protect from light.    Dispense:  120 tablet    Refill:  0     Follow-Up Instructions: No follow-ups on file.   Fuller Plan, MD  Note - This record has been created using [employer].  Chart creation errors have been sought, but may not always  have been located. Such creation errors do not reflect on  the standard of medical care.

## 2022-08-03 DIAGNOSIS — Z79899 Other long term (current) drug therapy: Secondary | ICD-10-CM | POA: Diagnosis not present

## 2022-08-03 DIAGNOSIS — M059 Rheumatoid arthritis with rheumatoid factor, unspecified: Secondary | ICD-10-CM | POA: Diagnosis not present

## 2022-08-04 LAB — CBC WITH DIFFERENTIAL/PLATELET
Absolute Monocytes: 454 cells/uL (ref 200–950)
Basophils Absolute: 81 cells/uL (ref 0–200)
Basophils Relative: 1 %
Eosinophils Absolute: 219 cells/uL (ref 15–500)
Eosinophils Relative: 2.7 %
HCT: 35.9 % (ref 35.0–45.0)
Hemoglobin: 11.9 g/dL (ref 11.7–15.5)
Lymphs Abs: 2616 cells/uL (ref 850–3900)
MCH: 28.3 pg (ref 27.0–33.0)
MCHC: 33.1 g/dL (ref 32.0–36.0)
MCV: 85.3 fL (ref 80.0–100.0)
MPV: 9.5 fL (ref 7.5–12.5)
Monocytes Relative: 5.6 %
Neutro Abs: 4730 cells/uL (ref 1500–7800)
Neutrophils Relative %: 58.4 %
Platelets: 387 10*3/uL (ref 140–400)
RBC: 4.21 10*6/uL (ref 3.80–5.10)
RDW: 15 % (ref 11.0–15.0)
Total Lymphocyte: 32.3 %
WBC: 8.1 10*3/uL (ref 3.8–10.8)

## 2022-08-04 LAB — SEDIMENTATION RATE: Sed Rate: 41 mm/h — ABNORMAL HIGH (ref 0–30)

## 2022-08-04 LAB — COMPLETE METABOLIC PANEL WITH GFR
AG Ratio: 1.3 (calc) (ref 1.0–2.5)
ALT: 22 U/L (ref 6–29)
AST: 17 U/L (ref 10–35)
Albumin: 3.6 g/dL (ref 3.6–5.1)
Alkaline phosphatase (APISO): 67 U/L (ref 37–153)
BUN: 7 mg/dL (ref 7–25)
CO2: 26 mmol/L (ref 20–32)
Calcium: 9.2 mg/dL (ref 8.6–10.4)
Chloride: 103 mmol/L (ref 98–110)
Creat: 0.67 mg/dL (ref 0.50–1.05)
Globulin: 2.8 g/dL (calc) (ref 1.9–3.7)
Glucose, Bld: 100 mg/dL (ref 65–139)
Potassium: 3.8 mmol/L (ref 3.5–5.3)
Sodium: 139 mmol/L (ref 135–146)
Total Bilirubin: 0.6 mg/dL (ref 0.2–1.2)
Total Protein: 6.4 g/dL (ref 6.1–8.1)
eGFR: 98 mL/min/{1.73_m2} (ref >=60)

## 2022-08-04 NOTE — Progress Notes (Signed)
Sedimentation rate continues to partially improve down to 41 from 55. Blood count and metabolic panel are normal no problems for continuing methotrexate.

## 2022-09-01 ENCOUNTER — Other Ambulatory Visit: Payer: Self-pay

## 2022-09-01 MED ORDER — METOPROLOL SUCCINATE ER 25 MG PO TB24: 25.0000 mg | ORAL_TABLET | Freq: Every day | ORAL | 3 refills | Status: DC

## 2022-09-20 ENCOUNTER — Encounter: Payer: Self-pay | Admitting: *Deleted

## 2022-09-21 ENCOUNTER — Telehealth: Payer: Medicaid Other | Admitting: Adult Health

## 2022-09-21 DIAGNOSIS — G4733 Obstructive sleep apnea (adult) (pediatric): Secondary | ICD-10-CM | POA: Diagnosis not present

## 2022-09-21 NOTE — Progress Notes (Signed)
PATIENT: Maria Winters DOB: [birth year]  REASON FOR VISIT: follow up HISTORY FROM: patient PRIMARY NEUROLOGIST:   Virtual Visit via Video Note  I connected with Maria Winters on 09/21/22 at  1:30 PM EDT by a video enabled telemedicine application located remotely at [facility] and verified that I am speaking with the correct person using two identifiers who was located at their own home.   I discussed the limitations of evaluation and management by telemedicine and the availability of in person appointments. The patient expressed understanding and agreed to proceed.   PATIENT: Maria Winters DOB: [birth year]  REASON FOR VISIT: follow up HISTORY FROM: patient    HISTORY OF PRESENT ILLNESS: Today 09/21/22:  Maria Winters is a 62 y.o. female with a history of OSA on CPAP. Returns today for follow-up. Overall CPAP is working well. Occasionally the mask will come off when she turns. She does change out her supplies regularly.  Download is below       REVIEW OF SYSTEMS: Out of a complete 14 system review of symptoms, the patient complains only of the following symptoms, and all other reviewed systems are negative.  ALLERGIES: Allergies  Allergen Reactions   Esomeprazole Magnesium     Patient states that the Nexium caused tremors    HOME MEDICATIONS: Outpatient Medications Prior to Visit  Medication Sig Dispense Refill   acetaminophen (TYLENOL) 325 MG tablet Take 650 mg by mouth every 6 (six) hours as needed.     cetirizine (ZYRTEC ALLERGY) 10 MG tablet Take 1 tablet (10 mg total) by mouth daily. 90 tablet 1   dexamethasone 0.5 MG/5ML elixir Take 0.5 mg by mouth 4 (four) times daily.     ELIQUIS 5 MG TABS tablet TAKE 1 TABLET BY MOUTH TWICE A DAY 60 tablet 3   fluocinonide gel (LIDEX) 0.05 % Apply topically 4 (four) times daily.     fluticasone (FLONASE) 50 MCG/ACT nasal spray Place 2 sprays into both nostrils daily. 16 g 6   folic  acid (FOLVITE) 1 MG tablet TAKE 1 TABLET BY MOUTH ONCE DAILY 90 tablet 1   methotrexate (RHEUMATREX) 2.5 MG tablet Take 10 tablets (25 mg total) by mouth once a week. Caution:Chemotherapy. Protect from light. 120 tablet 0   metoprolol succinate (TOPROL-XL) 25 MG 24 hr tablet Take 1 tablet (25 mg total) by mouth daily. 90 tablet 3   Semaglutide, 2 MG/DOSE, 8 MG/3ML SOPN Inject 2 mg as directed once a week. 3 mL 2   No facility-administered medications prior to visit.    PAST MEDICAL HISTORY: Past Medical History:  Diagnosis Date   Abnormal Pap smear of cervix    in her 13's-- hx of conization/LEEP procedure-normal paps since   Abnormal uterine bleeding    Anemia    ~2005   Arthritis    Atrial fibrillation (HCC)    Chest pain    Fibroid    GERD (gastroesophageal reflux disease)    Helicobacter pylori (H. pylori)    Psoriasis    Reflux    STD (sexually transmitted disease) 1994   Tx'd for Chlamydia   TMJ (temporomandibular joint syndrome)    Vision abnormalities    tunnel vision    PAST SURGICAL HISTORY: Past Surgical History:  Procedure Laterality Date   cervix dilated     CHOLECYSTECTOMY N/A 09/13/2012   Procedure: LAPAROSCOPIC CHOLECYSTECTOMY WITH INTRAOPERATIVE CHOLANGIOGRAM;  Surgeon: Valarie Merino, MD;  Location: [facility];  Service: General;  Laterality: N/A;   DILATION AND CURETTAGE OF UTERUS     DILATION AND CURETTAGE, DIAGNOSTIC / THERAPEUTIC  1989   with Conization   ESOPHAGOGASTRODUODENOSCOPY N/A 05/11/2012   Procedure: ESOPHAGOGASTRODUODENOSCOPY (EGD);  Surgeon: Malissa Hippo, MD;  Location: [facility];  Service: Endoscopy;  Laterality: N/A;  125   LEEP      FAMILY HISTORY: Family History  Problem Relation Age of Onset   Diabetes Mother    Hypertension Father    Healthy Sister    Hypertension Sister    Hypertension Brother    Gout Brother    Heart attack Brother    Breast cancer Sister 53       lumpectomy   Sickle cell trait Brother    Diabetes  Maternal Grandmother        with 2 BKA   Stroke Maternal Grandmother    Breast cancer Maternal Aunt     SOCIAL HISTORY: Social History   Socioeconomic History   Marital status: Single    Spouse name: Not on file   Number of children: 0   Years of education: Not on file   Highest education level: Not on file  Occupational History   Not on file  Tobacco Use   Smoking status: Former    Current packs/day: 0.00    Average packs/day: 0.3 packs/day for 10.0 years (2.5 ttl pk-yrs)    Types: Cigarettes    Start date: 06/21/1982    Quit date: 06/20/1992    Years since quitting: 30.2    Passive exposure: Never   Smokeless tobacco: Never  Vaping Use   Vaping status: Never Used  Substance and Sexual Activity   Alcohol use: Never   Drug use: No   Sexual activity: Never    Birth control/protection: Abstinence  Other Topics Concern   Not on file  Social History Narrative   Not on file   Social Determinants of Health   Financial Resource Strain: Not on file  Food Insecurity: Not on file  Transportation Needs: Not on file  Physical Activity: Not on file  Stress: Not on file  Social Connections: Not on file  Intimate Partner Violence: Not on file      PHYSICAL EXAM Generalized: Well developed, in no acute distress   Neurological examination  Mentation: Alert oriented to time, place, history taking. Follows all commands speech and language fluent Cranial nerve II-XII: Facial symmetry noted  DIAGNOSTIC DATA (LABS, IMAGING, TESTING) - I reviewed patient records, labs, notes, testing and imaging myself where available.  Lab Results  Component Value Date   WBC 8.1 08/03/2022   HGB 11.9 08/03/2022   HCT 35.9 08/03/2022   MCV 85.3 08/03/2022   PLT 387 08/03/2022      Component Value Date/Time   NA 139 08/03/2022 1544   NA 141 06/03/2022 1509   K 3.8 08/03/2022 1544   CL 103 08/03/2022 1544   CO2 26 08/03/2022 1544   GLUCOSE 100 08/03/2022 1544   BUN 7 08/03/2022 1544    BUN 3 (L) 06/03/2022 1509   CREATININE 0.67 08/03/2022 1544   CALCIUM 9.2 08/03/2022 1544   PROT 6.4 08/03/2022 1544   PROT 6.4 06/03/2022 1509   ALBUMIN 3.9 06/03/2022 1509   AST 17 08/03/2022 1544   ALT 22 08/03/2022 1544   ALKPHOS 84 06/03/2022 1509   BILITOT 0.6 08/03/2022 1544   BILITOT 0.3 06/03/2022 1509   GFRNONAA >60 07/02/2020 1442   GFRAA 83 08/24/2019 0806   Lab  Results  Component Value Date   CHOL 170 01/07/2022   HDL 92 01/07/2022   LDLCALC 63 01/07/2022   TRIG 79 01/07/2022   CHOLHDL 1.8 01/07/2022   Lab Results  Component Value Date   HGBA1C 6.2 (H) 06/03/2022   Lab Results  Component Value Date   VITAMINB12 380 04/02/2021   Lab Results  Component Value Date   TSH 1.130 01/07/2022      ASSESSMENT AND PLAN 62 y.o. year old female  has a past medical history of Abnormal Pap smear of cervix, Abnormal uterine bleeding, Anemia, Arthritis, Atrial fibrillation (HCC), Chest pain, Fibroid, GERD (gastroesophageal reflux disease), Helicobacter pylori (H. pylori), Psoriasis, Reflux, STD (sexually transmitted disease) (1994), TMJ (temporomandibular joint syndrome), and Vision abnormalities. here with:  OSA on CPAP  CPAP compliance excellent Residual AHI is good Encouraged patient to continue using CPAP nightly and > 4 hours each night F/U in 1 year or sooner if needed   Butch Penny, MSN, NP-C 09/21/2022, 1:26 PM [facility] [address] [place], [place] [address] [phone]

## 2022-10-05 DIAGNOSIS — Z0271 Encounter for disability determination: Secondary | ICD-10-CM

## 2022-10-18 NOTE — Progress Notes (Signed)
Office Visit Note  Patient: Maria Winters             Date of Birth: 1961/01/23           MRN: [id number]             PCP: Junie Spencer, FNP Referring: Junie Spencer, FNP Visit Date: 11/01/2022   Subjective:  Follow-up (Patient states she had a couple episodes of TMJ, but they were not severe. Patient states she had some joint pain that she believes could be from weather change. )   History of Present Illness: Maria Winters is a 62 y.o. female here for follow up for seropositive RA on methotrexate increased to 25 mg p.o. weekly since last visit and on folic acid 1 mg daily.  Since her last visit she had a few flareups of increased symptoms with bilateral hand shoulder and feet pain.  Also associated with episode of TMJ and these were acting up worse in June or early July she thinks associated with weather changes.  Since then come back down currently does not have any prolonged morning stiffness or joint swelling.  Last follow-up with her eye doctor they reported there was some inflammation thought to be related with her rheumatoid arthritis.  She was prescribed Restasis for this to use in addition to artificial tears.  Previous HPI 07/28/2022 Maria Winters is a 62 y.o. female here for follow up for seropositive RA on methotrexate increased to 25 mg p.o. weekly since last visit and on folic acid 1 mg daily.  She had several events since her last visit she officially retired and so had a transition in insurance coverage which delayed her scheduling this follow-up appointment.  She was sick with upper respiratory infection symptoms and had a positive COVID test in February took molnupiravir for this.  She is now on semaglutide injection and has worked on cutting out sweets and caffeine from her diet as well as consistent daily exercise and nighttime use of CPAP.  Overall she feels very well with less fatigue and inflammation than she has had in the past several years.    Previous HPI 03/31/22 Maria Winters is a 62 y.o. female here for follow up of rheumatoid arthritis on methotrexate 20 mg p.o. weekly and folic acid 1 mg daily.  She has increased problems since a flareup started in her right foot on January 9.  She experiences migratory joint inflammation mostly in 1 joint at a time starting in the right foot proceeded to eventually involve both feet both hands and both shoulders each spot lasting for about 2 or 3 days at a time.  Currently symptoms are improved again without any swollen area.  She did not recall any kind of illness injury or change in activity prior to this episode.  She has not been taking any specific medication for but is the spending all weekend resting and elevating her feet but symptoms worsening and during the week while having to be more active.   Previous HPI 01/25/22 Maria Winters is a 62 y.o. female here for follow up for seropositive RA  on MTX 15 mg PO weekly and folic acid 1 mg daily.  She has been mostly doing well since her last visit without significant daily peripheral joint pains or swelling.  Does get pain in her knees somewhat limiting for prolonged walking standing and low back pains in a fixed seated position.  She is noticing pain in her  feet on both sides most often after driving.  Had some episodic increase in pain at her hands and elbows improving on their own within about 2 weeks.  Also recently went to the emergency department a few weeks ago due to onset of left-sided jaw pain.  Evaluation was reassuring against cardiac etiology thought to be temporomandibular joint pain.  She completed a steroid taper and low-dose hydrocodone symptoms have resolved.   Previous HPI 10/20/2021 Maria Winters is a 63 y.o. female here for follow up for seropositive RA on methotrexate 15 mg p.o. weekly.  She seen a sizable improvement in much of her joint symptoms and has not experienced any trouble taking the medication.  She is  having some pain in the right hip most often occurred when sitting in 1 position for prolonged time usually about 30 minutes or longer provokes this.  Otherwise joint problems are mostly doing better and she is not seeing any ongoing swelling changes.   Previous HPI  06/22/2021 Maria Winters is a 62 y.o. female here for follow up for seropositive RA after starting methotrexate 15 mg PO weekly. Also increase vitamin D supplementation to 5000 units daily. She misunderstood instructions so started taking methotrexate only 1 tablet weekly for 2 weeks then increased to 6 tablets as planned for the past 2 weeks. She has some diarrhea with the medication but not severe or lasting continuously. She had increased in bilateral heel pain worst when getting up to walk initially, the left side has improved but right side ongoing symptoms. Also now for about 3 days has pain in her left side around the flank or middle and low back. This radiates around to the front. No dysuria, frequency, or hematuria reported. She does not recall any injury. No history of kidney stones or kidney infections.   Previous HPI 05/07/21 Maria Winters is a 62 y.o. female here for follow up for new seropositive RA labs at initial visit negative screening for hepatitis and TB her CCP is highly positive and vitamin D low at 15. Xrays showed mild osteoarthritis in the feet otherwise unremarkable. She continues having significant joint pain symptoms affected hands and feet and fatigue. She notices more difficulty performing tasks at work such as pulling drawer handles or turning door knobs with pain or poor grip strength.   Previous HPI 04/21/21 Maria Winters is a 62 y.o. female here for evaluation of joint pain in multiple areas with elevated RF and sed rate. She reports joint pain starting since about a month ago. Pain is affecting many areas including shoulders, elbows, wrists, fingers, hips, knees, and ankles. She has some night time  awakenings due to hand pain. Decreased grip strength is affecting her daily activities including typing and driving. Swelling is most apparent in her hands and on the top of her feet around the base of the toes. She is stiff for about 3 hours each morning before getting some improvement in her mobility. She is not able to take NSAIDs due to anticoagulation for Afib she takes tylenol which helps but has to repeat this about every 8 hours for symptoms returning.   Labs reviewed 03/2021 RF 494 ESR 70   Review of Systems  Constitutional:  Negative for fatigue.  HENT:  Negative for mouth sores and mouth dryness.   Eyes:  Positive for dryness.  Respiratory:  Negative for shortness of breath.   Cardiovascular:  Negative for chest pain and palpitations.  Gastrointestinal:  Negative for blood  in stool, constipation and diarrhea.  Endocrine: Negative for increased urination.  Genitourinary:  Negative for involuntary urination.  Musculoskeletal:  Positive for joint pain and joint pain. Negative for gait problem, joint swelling, myalgias, muscle weakness, morning stiffness, muscle tenderness and myalgias.  Skin:  Negative for color change, rash, hair loss and sensitivity to sunlight.  Allergic/Immunologic: Negative for susceptible to infections.  Neurological:  Negative for dizziness and headaches.  Hematological:  Negative for swollen glands.  Psychiatric/Behavioral:  Negative for depressed mood and sleep disturbance. The patient is not nervous/anxious.     PMFS History:  Patient Active Problem List   Diagnosis Date Noted   Eye inflammation 11/01/2022   OSA (obstructive sleep apnea) 11/27/2021   Morbid obesity (HCC) 05/11/2021   Paroxysmal atrial fibrillation (HCC) 05/11/2021   Intermittent palpitations 05/11/2021   Excessive daytime sleepiness 05/11/2021   At risk for central sleep apnea 05/11/2021   Seropositive rheumatoid arthritis (HCC) 04/21/2021   Vitamin D deficiency 04/21/2021   High  risk medication use 04/21/2021   Myalgia 03/20/2021   Vertigo 07/23/2019   Constipation 07/23/2019   Anxiety state 03/18/2016   Migraine equivalent 03/18/2016   Type 2 diabetes mellitus with other specified complication (HCC) 06/06/2015   S/P lap cholecystectomy July 2014 09/14/2012   GERD (gastroesophageal reflux disease) 04/28/2012    Past Medical History:  Diagnosis Date   Abnormal Pap smear of cervix    in her 20's-- hx of conization/LEEP procedure-normal paps since   Abnormal uterine bleeding    Anemia    ~2005   Arthritis    Atrial fibrillation (HCC)    Chest pain    Fibroid    GERD (gastroesophageal reflux disease)    Helicobacter pylori (H. pylori)    Psoriasis    Reflux    STD (sexually transmitted disease) 1994   Tx'd for Chlamydia   TMJ (temporomandibular joint syndrome)    Vision abnormalities    tunnel vision    Family History  Problem Relation Age of Onset   Diabetes Mother    Hypertension Father    Healthy Sister    Hypertension Sister    Hypertension Brother    Gout Brother    Heart attack Brother    Breast cancer Sister 56       lumpectomy   Sickle cell trait Brother    Diabetes Maternal Grandmother        with 2 BKA   Stroke Maternal Grandmother    Breast cancer Maternal Aunt    Past Surgical History:  Procedure Laterality Date   cervix dilated     CHOLECYSTECTOMY N/A 09/13/2012   Procedure: LAPAROSCOPIC CHOLECYSTECTOMY WITH INTRAOPERATIVE CHOLANGIOGRAM;  Surgeon: Valarie Merino, MD;  Location: [facility];  Service: General;  Laterality: N/A;   DILATION AND CURETTAGE OF UTERUS     DILATION AND CURETTAGE, DIAGNOSTIC / THERAPEUTIC  1989   with Conization   ESOPHAGOGASTRODUODENOSCOPY N/A 05/11/2012   Procedure: ESOPHAGOGASTRODUODENOSCOPY (EGD);  Surgeon: Malissa Hippo, MD;  Location: [facility];  Service: Endoscopy;  Laterality: N/A;  125   LEEP     Social History   Social History Narrative   Not on file   Immunization History   Administered Date(s) Administered   Influenza Split 04/19/2012   Influenza,inj,Quad PF,6+ Mos 01/24/2015, 12/23/2017, 12/07/2019, 03/20/2021, 01/07/2022   PFIZER(Purple Top)SARS-COV-2 Vaccination 06/16/2019, 07/07/2019, 01/26/2020   Pneumococcal Conjugate-13 08/31/2016   Pneumococcal Polysaccharide-23 12/07/2019   Tdap 08/31/2016   Zoster Recombinant(Shingrix) 03/06/2019, 05/18/2019  Objective: Vital Signs: BP 104/65 (BP Location: Left Arm, Patient Position: Sitting, Cuff Size: Normal)   Pulse 66   Resp 14   Ht 5' 5.5" (1.664 m)   Wt 240 lb (108.9 kg)   LMP 03/06/2021 (Exact Date)   BMI 39.33 kg/m    Physical Exam Constitutional:      Appearance: She is obese.  Eyes:     Conjunctiva/sclera: Conjunctivae normal.  Cardiovascular:     Rate and Rhythm: Normal rate and regular rhythm.  Pulmonary:     Effort: Pulmonary effort is normal.     Breath sounds: Normal breath sounds.  Musculoskeletal:     Right lower leg: No edema.     Left lower leg: No edema.  Lymphadenopathy:     Cervical: No cervical adenopathy.  Skin:    General: Skin is warm and dry.     Findings: No rash.  Neurological:     Mental Status: She is alert.  Psychiatric:        Mood and Affect: Mood normal.      Musculoskeletal Exam:  Shoulders full ROM no tenderness or swelling Elbows full ROM no tenderness or swelling Wrists full ROM no tenderness or swelling Fingers full ROM no tenderness or swelling Knees full ROM no tenderness or swelling Ankles full ROM no tenderness or swelling   CDAI Exam: CDAI Score: 3  Patient Global: 20 / 100; Provider Global: 10 / 100 Swollen: 0 ; Tender: 0  Joint Exam 11/01/2022   All documented joints were normal     Investigation: No additional findings.  Imaging: No results found.  Recent Labs: Lab Results  Component Value Date   WBC 8.1 08/03/2022   HGB 11.9 08/03/2022   PLT 387 08/03/2022   NA 139 08/03/2022   K 3.8 08/03/2022   CL 103  08/03/2022   CO2 26 08/03/2022   GLUCOSE 100 08/03/2022   BUN 7 08/03/2022   CREATININE 0.67 08/03/2022   BILITOT 0.6 08/03/2022   ALKPHOS 84 06/03/2022   AST 17 08/03/2022   ALT 22 08/03/2022   PROT 6.4 08/03/2022   ALBUMIN 3.9 06/03/2022   CALCIUM 9.2 08/03/2022   GFRAA 83 08/24/2019   QFTBGOLDPLUS NEGATIVE 04/21/2021    Speciality Comments: Labs not drawn 5/22 due to currently transitioning insurance, plans to have collected at local [facility] once she has the card  Procedures:  No procedures performed Allergies: Esomeprazole magnesium   Assessment / Plan:     Visit Diagnoses: Seropositive rheumatoid arthritis (HCC) - Plan: methotrexate (RHEUMATREX) 2.5 MG tablet, folic acid (FOLVITE) 1 MG tablet, Sedimentation rate  Inflammation symptoms appear well-controlled today but the reported episodes sound pretty suggestive for RA flare including the TMJ.  But symptoms did improve without any specific additional intervention required or complication.  Sedimentation rate has been persistently elevated though was trending downward will repeat today for disease activity monitoring.  Plan to continue methotrexate 25 mg p.o. weekly and folic acid 1 mg daily.  High risk medication use - methotrexate 25 mg p.o. weekly folic acid 1 mg daily. - Plan: CBC with Differential/Platelet, COMPLETE METABOLIC PANEL WITH GFR  Checking CBC and CMP for medication monitoring on long-term continued use of methotrexate.  No serious interval infections.  Eye inflammation  Reports eye inflammation with her regular eye doctor follow-up and was prescribed Restasis which would be indicated for dry eye syndrome.  Secondary sicca syndrome would not necessarily be an indication to escalate therapy but if there is any evidence  of scleritis or iritis would have a low threshold to recommend addition of biologic DMARD.  Will try and request exam report.  Orders: Orders Placed This Encounter  Procedures   Sedimentation rate    CBC with Differential/Platelet   COMPLETE METABOLIC PANEL WITH GFR   Meds ordered this encounter  Medications   methotrexate (RHEUMATREX) 2.5 MG tablet    Sig: Take 10 tablets (25 mg total) by mouth once a week. Caution:Chemotherapy. Protect from light.    Dispense:  130 tablet    Refill:  0   folic acid (FOLVITE) 1 MG tablet    Sig: Take 1 tablet (1 mg total) by mouth daily.    Dispense:  90 tablet    Refill:  3     Follow-Up Instructions: Return in about 3 months (around 02/01/2023) for RA on MTX f/u 3mos.   Fuller Plan, MD  Note - This record has been created using [employer].  Chart creation errors have been sought, but may not always  have been located. Such creation errors do not reflect on  the standard of medical care.

## 2022-10-26 ENCOUNTER — Other Ambulatory Visit: Payer: Self-pay | Admitting: Internal Medicine

## 2022-10-26 DIAGNOSIS — M059 Rheumatoid arthritis with rheumatoid factor, unspecified: Secondary | ICD-10-CM

## 2022-10-26 NOTE — Telephone Encounter (Signed)
Last Fill: 07/28/2022  Labs: 08/03/2022 Sedimentation rate continues to partially improve down to 41 from 55. Blood count and metabolic panel are normal no problems for continuing methotrexate.   Next Visit: 11/01/2022  Last Visit: 07/28/2022  DX: Seropositive rheumatoid arthritis   Current Dose per office note 07/28/2022: methotrexate 25 mg p.o. weekly   Okay to refill Methotrexate?

## 2022-11-01 ENCOUNTER — Ambulatory Visit: Payer: Medicaid Other | Attending: Internal Medicine | Admitting: Internal Medicine

## 2022-11-01 ENCOUNTER — Encounter: Payer: Self-pay | Admitting: Internal Medicine

## 2022-11-01 VITALS — BP 104/65 | HR 66 | Resp 14 | Ht 65.5 in | Wt 240.0 lb

## 2022-11-01 DIAGNOSIS — M059 Rheumatoid arthritis with rheumatoid factor, unspecified: Secondary | ICD-10-CM

## 2022-11-01 DIAGNOSIS — H5789 Other specified disorders of eye and adnexa: Secondary | ICD-10-CM | POA: Diagnosis not present

## 2022-11-01 DIAGNOSIS — G4719 Other hypersomnia: Secondary | ICD-10-CM

## 2022-11-01 DIAGNOSIS — Z79899 Other long term (current) drug therapy: Secondary | ICD-10-CM | POA: Diagnosis not present

## 2022-11-01 MED ORDER — FOLIC ACID 1 MG PO TABS: 1.0000 mg | ORAL_TABLET | Freq: Every day | ORAL | 3 refills | Status: DC

## 2022-11-01 MED ORDER — METHOTREXATE SODIUM 2.5 MG PO TABS
25.0000 mg | ORAL_TABLET | ORAL | 0 refills | Status: DC
Start: 2022-11-01 — End: 2023-02-01

## 2022-11-02 ENCOUNTER — Telehealth: Payer: Self-pay

## 2022-11-02 LAB — CBC WITH DIFFERENTIAL/PLATELET
Absolute Monocytes: 299 {cells}/uL (ref 200–950)
Basophils Absolute: 49 cells/uL (ref 0–200)
Basophils Relative: 0.8 %
Eosinophils Absolute: 220 {cells}/uL (ref 15–500)
Eosinophils Relative: 3.6 %
HCT: 33.9 % — ABNORMAL LOW (ref 35.0–45.0)
Hemoglobin: 10.8 g/dL — ABNORMAL LOW (ref 11.7–15.5)
Lymphs Abs: 2117 {cells}/uL (ref 850–3900)
MCH: 28.4 pg (ref 27.0–33.0)
MCHC: 31.9 g/dL — ABNORMAL LOW (ref 32.0–36.0)
MCV: 89.2 fL (ref 80.0–100.0)
MPV: 9 fL (ref 7.5–12.5)
Monocytes Relative: 4.9 %
Neutro Abs: 3416 {cells}/uL (ref 1500–7800)
Neutrophils Relative %: 56 %
Platelets: 375 10*3/uL (ref 140–400)
RBC: 3.8 10*6/uL (ref 3.80–5.10)
RDW: 13.9 % (ref 11.0–15.0)
Total Lymphocyte: 34.7 %
WBC: 6.1 10*3/uL (ref 3.8–10.8)

## 2022-11-02 LAB — COMPLETE METABOLIC PANEL WITH GFR
AG Ratio: 1.3 (calc) (ref 1.0–2.5)
ALT: 23 U/L (ref 6–29)
AST: 18 U/L (ref 10–35)
Albumin: 3.4 g/dL — ABNORMAL LOW (ref 3.6–5.1)
Alkaline phosphatase (APISO): 83 U/L (ref 37–153)
BUN: 8 mg/dL (ref 7–25)
CO2: 30 mmol/L (ref 20–32)
Calcium: 8.9 mg/dL (ref 8.6–10.4)
Chloride: 104 mmol/L (ref 98–110)
Creat: 0.71 mg/dL (ref 0.50–1.05)
Globulin: 2.7 g/dL (ref 1.9–3.7)
Glucose, Bld: 147 mg/dL — ABNORMAL HIGH (ref 65–99)
Potassium: 4.3 mmol/L (ref 3.5–5.3)
Sodium: 139 mmol/L (ref 135–146)
Total Bilirubin: 0.3 mg/dL (ref 0.2–1.2)
Total Protein: 6.1 g/dL (ref 6.1–8.1)
eGFR: 95 mL/min/{1.73_m2} (ref >=60)

## 2022-11-02 LAB — SEDIMENTATION RATE: Sed Rate: 51 mm/h — ABNORMAL HIGH (ref 0–30)

## 2022-11-02 NOTE — Telephone Encounter (Signed)
Contacted [facility] to request last visit notes for Dr. Dimple Casey. States they will fax over the visit notes.

## 2022-11-02 NOTE — Progress Notes (Signed)
Sedimentation rate is increased somewhat to 51 up from 41 last time.  Hemoglobin of 10.8 is slightly below normal previously she was normal at 12.  Kidney and liver function test are normal.  No problem for continuing the current methotrexate.

## 2022-11-10 DIAGNOSIS — Z0271 Encounter for disability determination: Secondary | ICD-10-CM

## 2022-11-12 ENCOUNTER — Other Ambulatory Visit: Payer: Self-pay | Admitting: Cardiology

## 2022-11-16 ENCOUNTER — Telehealth: Payer: Self-pay | Admitting: Family Medicine

## 2022-11-24 ENCOUNTER — Ambulatory Visit: Payer: BC Managed Care – PPO | Admitting: Cardiology

## 2022-11-25 ENCOUNTER — Ambulatory Visit: Payer: BC Managed Care – PPO | Admitting: Cardiology

## 2022-11-30 ENCOUNTER — Encounter: Payer: Self-pay | Admitting: Nurse Practitioner

## 2022-11-30 ENCOUNTER — Ambulatory Visit (INDEPENDENT_AMBULATORY_CARE_PROVIDER_SITE_OTHER): Payer: Medicaid Other | Admitting: Nurse Practitioner

## 2022-11-30 VITALS — BP 120/66 | HR 67 | Temp 97.7°F | Ht 65.5 in | Wt 239.8 lb

## 2022-11-30 DIAGNOSIS — J029 Acute pharyngitis, unspecified: Secondary | ICD-10-CM | POA: Diagnosis not present

## 2022-11-30 DIAGNOSIS — U071 COVID-19: Secondary | ICD-10-CM | POA: Insufficient documentation

## 2022-11-30 NOTE — Progress Notes (Signed)
Acute Office Visit  Subjective:     Patient ID: Maria Winters, female    DOB: Dec 23, 1960, 62 y.o.   MRN: [id number]  Chief Complaint  Patient presents with   Covid Positive    Tested covid positive this morning, Sore throat, fatigue, headache started last night    HPI SUBJECTIVE:  Maria Winters is a 62 y.o. female here today for an acute visit, as she tested positive for COVID this morning. 'Took a long time yesterday afternoon and woke with HA, sore throat, malaise". She c/o of mild sore throat, cough and malaise for 2-days, which has improved.  She denies a history of anorexia, chest pain, dizziness, fevers, nausea, shortness of breath, and vomiting and denies a history of asthma. Patient denies smoke cigarettes.  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 Advise her to discuss with her PCP ather next f/u is the feeling remains the same.   Active Ambulatory Problems    Diagnosis Date Noted   GERD (gastroesophageal reflux disease) 04/28/2012   S/P lap cholecystectomy July 2014 09/14/2012   Type 2 diabetes mellitus with other specified complication (HCC) 06/06/2015   Anxiety state 03/18/2016   Migraine equivalent 03/18/2016   Vertigo 07/23/2019   Constipation 07/23/2019   Myalgia 03/20/2021   Seropositive rheumatoid arthritis (HCC) 04/21/2021   Vitamin D deficiency 04/21/2021   High risk medication use 04/21/2021   Morbid obesity (HCC) 05/11/2021   Paroxysmal atrial fibrillation (HCC) 05/11/2021   Intermittent palpitations 05/11/2021   Excessive daytime sleepiness 05/11/2021   At risk for central sleep apnea 05/11/2021   OSA (obstructive sleep apnea) 11/27/2021   Eye inflammation 11/01/2022   Positive  self-administered antigen test for COVID-19 11/30/2022   Sore throat 11/30/2022   Resolved Ambulatory Problems    Diagnosis Date Noted   Chest pain, unspecified 04/28/2012   Gallstone 1.8 cm 06/15/2012   Elevated blood pressure 05/09/2015   Rhinitis, allergic 06/06/2015   Morbid obesity (HCC) 06/06/2015   Confusion 03/17/2016   Left flank pain 06/22/2021   Past Medical History:  Diagnosis Date   Abnormal Pap smear of cervix    Abnormal uterine bleeding    Anemia    Arthritis    Atrial fibrillation (HCC)    Chest pain    Fibroid    Helicobacter pylori (H. pylori)    Psoriasis    Reflux    STD (sexually transmitted disease) 1994   TMJ (temporomandibular joint syndrome)    Vision abnormalities     ROS Negative unless indicated in HPI    Objective:    BP 120/66   Pulse 67   Temp 97.7 F (36.5 C) (Temporal)   Ht 5' 5.5" (1.664 m)   Wt 239 lb 12.8 oz (108.8 kg)   LMP 03/06/2021 (Exact Date)   SpO2 100%   BMI 39.30 kg/m  BP Readings from Last 3 Encounters:  11/30/22 120/66  11/01/22 104/65  07/28/22 104/72   Wt Readings from Last 3 Encounters:  11/30/22 239 lb 12.8 oz (108.8 kg)  11/01/22 240 lb (108.9 kg)  07/28/22 245 lb (111.1 kg)  Physical Exam Appears well, vital signs are as noted. Ears normal.  Throat and pharynx normal.  Neck supple. No adenopathy in the neck. Nose is congested. Sinuses non tender. The chest is clear, without wheezes or rales. No results found for any visits on 11/30/22.      Assessment & Plan:  Positive self-administered antigen test for COVID-19  Sore throat   Maria Winters is 62 yrs old female COVID+ home test, no acute distress Last 11/03/22 eGF 95, will not treat her with Paxlovid because her symptoms are very mild. She is instructed to call the clinic, if symptoms worsen and feel the need for medication OTC cough syrup of her choice Increase hydration, rest, and ibuprofen/tylenol for fever  Follow new CDC guidelines for  isolation protocols She verbalized understanding  PS: Score  11 on PHQ-9 not interested in counseling or med at his time. Advise to discuss it with PCP if her symptoms/feeling remains the same.  Return if symptoms worsen or fail to improve.     Person Under Monitoring Name: Maria Winters  Location: [address] [place] [address]   Infection Prevention Recommendations for Individuals Confirmed to have, or Being Evaluated for, 2019 Novel Coronavirus (COVID-19) Infection Who Receive Care at Home  Individuals who are confirmed to have, or are being evaluated for, COVID-19 should follow the prevention steps below until a healthcare provider or local or state health department says they can return to normal activities.  Stay home except to get medical care You should restrict activities outside your home, except for getting medical care. Do not go to work, school, or public areas, and do not use public transportation or taxis.  Call ahead before visiting your doctor Before your medical appointment, call the healthcare provider and tell them that you have, or are being evaluated for, COVID-19 infection. This will help the healthcare provider's office take steps to keep other people from getting infected. Ask your healthcare provider to call the local or state health department.  Monitor your symptoms Seek prompt medical attention if your illness is worsening (e.g., difficulty breathing). Before going to your medical appointment, call the healthcare provider and tell them that you have, or are being evaluated for, COVID-19 infection. Ask your healthcare provider to call the local or state health department.  Wear a facemask You should wear a facemask that covers your nose and mouth when you are in the same room with other people and when you visit a healthcare provider. People who live with or visit you should also wear a facemask while they are in the same room with  you.  Separate yourself from other people in your home As much as possible, you should stay in a different room from other people in your home. Also, you should use a separate bathroom, if available.  Avoid sharing household items You should not share dishes, drinking glasses, cups, eating utensils, towels, bedding, or other items with other people in your home. After using these items, you should wash them thoroughly with soap and water.  Cover your coughs and sneezes Cover your mouth and nose with a tissue when you cough or sneeze, or you can cough or sneeze into your sleeve. Throw used tissues in a lined trash can, and immediately wash your hands with soap and water for at least 20 seconds or use an alcohol-based hand rub.  Wash your [employer] your hands often and thoroughly with soap and water for at least 20  seconds. You can use an alcohol-based hand sanitizer if soap and water are not available and if your hands are not visibly dirty. Avoid touching your eyes, nose, and mouth with unwashed hands.   Prevention Steps for Caregivers and Household Members of Individuals Confirmed to have, or Being Evaluated for, COVID-19 Infection Being Cared for in the Home  If you live with, or provide care at home for, a person confirmed to have, or being evaluated for, COVID-19 infection please follow these guidelines to prevent infection:  Follow healthcare provider's instructions Make sure that you understand and can help the patient follow any healthcare provider instructions for all care.  Provide for the patient's basic needs You should help the patient with basic needs in the home and provide support for getting groceries, prescriptions, and other personal needs.  Monitor the patient's symptoms If they are getting sicker, call his or her medical provider and tell them that the patient has, or is being evaluated for, COVID-19 infection. This will help the healthcare provider's office  take steps to keep other people from getting infected. Ask the healthcare provider to call the local or state health department.  Limit the number of people who have contact with the patient If possible, have only one caregiver for the patient. Other household members should stay in another home or place of residence. If this is not possible, they should stay in another room, or be separated from the patient as much as possible. Use a separate bathroom, if available. Restrict visitors who do not have an essential need to be in the home.  Keep older adults, very young children, and other sick people away from the patient Keep older adults, very young children, and those who have compromised immune systems or chronic health conditions away from the patient. This includes people with chronic heart, lung, or kidney conditions, diabetes, and cancer.  Ensure good ventilation Make sure that shared spaces in the home have good air flow, such as from an air conditioner or an opened window, weather permitting.  Wash your hands often Wash your hands often and thoroughly with soap and water for at least 20 seconds. You can use an alcohol based hand sanitizer if soap and water are not available and if your hands are not visibly dirty. Avoid touching your eyes, nose, and mouth with unwashed hands. Use disposable paper towels to dry your hands. If not available, use dedicated cloth towels and replace them when they become wet.  Wear a facemask and gloves Wear a disposable facemask at all times in the room and gloves when you touch or have contact with the patient's blood, body fluids, and/or secretions or excretions, such as sweat, saliva, sputum, nasal mucus, vomit, urine, or feces.  Ensure the mask fits over your nose and mouth tightly, and do not touch it during use. Throw out disposable facemasks and gloves after using them. Do not reuse. Wash your hands immediately after removing your facemask and  gloves. If your personal clothing becomes contaminated, carefully remove clothing and launder. Wash your hands after handling contaminated clothing. Place all used disposable facemasks, gloves, and other waste in a lined container before disposing them with other household waste. Remove gloves and wash your hands immediately after handling these items.  Do not share dishes, glasses, or other household items with the patient Avoid sharing household items. You should not share dishes, drinking glasses, cups, eating utensils, towels, bedding, or other items with a patient who is confirmed to have,  or being evaluated for, COVID-19 infection. After the person uses these items, you should wash them thoroughly with soap and water.  Wash laundry thoroughly Immediately remove and wash clothes or bedding that have blood, body fluids, and/or secretions or excretions, such as sweat, saliva, sputum, nasal mucus, vomit, urine, or feces, on them. Wear gloves when handling laundry from the patient. Read and follow directions on labels of laundry or clothing items and detergent. In general, wash and dry with the warmest temperatures recommended on the label.  Clean all areas the individual has used often Clean all touchable surfaces, such as counters, tabletops, doorknobs, bathroom fixtures, toilets, phones, keyboards, tablets, and bedside tables, every day. Also, clean any surfaces that may have blood, body fluids, and/or secretions or excretions on them. Wear gloves when cleaning surfaces the patient has come in contact with. Use a diluted bleach solution (e.g., dilute bleach with 1 part bleach and 10 parts water) or a household disinfectant with a label that says EPA-registered for coronaviruses. To make a bleach solution at home, add 1 tablespoon of bleach to 1 quart (4 cups) of water. For a larger supply, add  cup of bleach to 1 gallon (16 cups) of water. Read labels of cleaning products and follow  recommendations provided on product labels. Labels contain instructions for safe and effective use of the cleaning product including precautions you should take when applying the product, such as wearing gloves or eye protection and making sure you have good ventilation during use of the product. Remove gloves and wash hands immediately after cleaning.  Monitor yourself for signs and symptoms of illness Caregivers and household members are considered close contacts, should monitor their health, and will be asked to limit movement outside of the home to the extent possible. Follow the monitoring steps for close contacts listed on the symptom monitoring form.   ? If you have additional questions, contact your local health department or call the epidemiologist on call at [phone] (available 24/7). ? This guidance is subject to change. For the most up-to-date guidance from [facility], please refer to their website: [email or web address]   Arrie Aran Santa Lighter, DNP [facility] [address] [place], [place] [address] [phone]

## 2022-12-06 ENCOUNTER — Ambulatory Visit: Payer: BC Managed Care – PPO | Admitting: Cardiology

## 2022-12-16 ENCOUNTER — Other Ambulatory Visit: Payer: Self-pay | Admitting: Internal Medicine

## 2022-12-16 DIAGNOSIS — M059 Rheumatoid arthritis with rheumatoid factor, unspecified: Secondary | ICD-10-CM

## 2023-01-05 DIAGNOSIS — G4733 Obstructive sleep apnea (adult) (pediatric): Secondary | ICD-10-CM | POA: Diagnosis not present

## 2023-01-18 NOTE — Progress Notes (Unsigned)
Office Visit Note  Patient: Maria Winters             Date of Birth: November 20, 1960           MRN: [id number]             PCP: Junie Spencer, FNP Referring: Junie Spencer, FNP Visit Date: 02/01/2023   Subjective:  Follow-up (Patient states she has had more joint pain in her feet and hips lately. )   History of Present Illness: Maria Winters is a 62 y.o. female here for follow up for seropositive RA on methotrexate increased to 25 mg p.o. weekly since last visit and on folic acid 1 mg daily.  Overall symptoms have been doing worse recently.  Especially has been experiencing increased pain in her hips and at her ankles in the past few weeks.  This is bad enough that she cannot bear weight on her left foot a couple occasions during the past 4 days.  She is also noticed pain and swelling in the fingers usually affecting 2 or 3 fingers at a time.  She did not notice any change in activity no new illness or injury.  The cold weather does seem to at least somewhat aggravate symptoms.  She has noticed some generalized hair thinning wonders if this is related to the medication.  Previous HPI 11/01/2022 Maria Winters is a 62 y.o. female here for follow up for seropositive RA on methotrexate increased to 25 mg p.o. weekly since last visit and on folic acid 1 mg daily.  Since her last visit she had a few flareups of increased symptoms with bilateral hand shoulder and feet pain.  Also associated with episode of TMJ and these were acting up worse in June or early July she thinks associated with weather changes.  Since then come back down currently does not have any prolonged morning stiffness or joint swelling.  Last follow-up with her eye doctor they reported there was some inflammation thought to be related with her rheumatoid arthritis.  She was prescribed Restasis for this to use in addition to artificial tears.   Previous HPI 07/28/2022 Maria Winters is a 62 y.o. female here for  follow up for seropositive RA on methotrexate increased to 25 mg p.o. weekly since last visit and on folic acid 1 mg daily.  She had several events since her last visit she officially retired and so had a transition in insurance coverage which delayed her scheduling this follow-up appointment.  She was sick with upper respiratory infection symptoms and had a positive COVID test in February took molnupiravir for this.  She is now on semaglutide injection and has worked on cutting out sweets and caffeine from her diet as well as consistent daily exercise and nighttime use of CPAP.  Overall she feels very well with less fatigue and inflammation than she has had in the past several years.   Previous HPI 03/31/22 Maria Winters is a 62 y.o. female here for follow up of rheumatoid arthritis on methotrexate 20 mg p.o. weekly and folic acid 1 mg daily.  She has increased problems since a flareup started in her right foot on January 9.  She experiences migratory joint inflammation mostly in 1 joint at a time starting in the right foot proceeded to eventually involve both feet both hands and both shoulders each spot lasting for about 2 or 3 days at a time.  Currently symptoms are improved again without any swollen  area.  She did not recall any kind of illness injury or change in activity prior to this episode.  She has not been taking any specific medication for but is the spending all weekend resting and elevating her feet but symptoms worsening and during the week while having to be more active.   Previous HPI 01/25/22 Maria Winters is a 62 y.o. female here for follow up for seropositive RA  on MTX 15 mg PO weekly and folic acid 1 mg daily.  She has been mostly doing well since her last visit without significant daily peripheral joint pains or swelling.  Does get pain in her knees somewhat limiting for prolonged walking standing and low back pains in a fixed seated position.  She is noticing pain in her feet  on both sides most often after driving.  Had some episodic increase in pain at her hands and elbows improving on their own within about 2 weeks.  Also recently went to the emergency department a few weeks ago due to onset of left-sided jaw pain.  Evaluation was reassuring against cardiac etiology thought to be temporomandibular joint pain.  She completed a steroid taper and low-dose hydrocodone symptoms have resolved.   Previous HPI 10/20/2021 Maria Winters is a 62 y.o. female here for follow up for seropositive RA on methotrexate 15 mg p.o. weekly.  She seen a sizable improvement in much of her joint symptoms and has not experienced any trouble taking the medication.  She is having some pain in the right hip most often occurred when sitting in 1 position for prolonged time usually about 30 minutes or longer provokes this.  Otherwise joint problems are mostly doing better and she is not seeing any ongoing swelling changes.   Previous HPI  06/22/2021 Maria Winters is a 62 y.o. female here for follow up for seropositive RA after starting methotrexate 15 mg PO weekly. Also increase vitamin D supplementation to 5000 units daily. She misunderstood instructions so started taking methotrexate only 1 tablet weekly for 2 weeks then increased to 6 tablets as planned for the past 2 weeks. She has some diarrhea with the medication but not severe or lasting continuously. She had increased in bilateral heel pain worst when getting up to walk initially, the left side has improved but right side ongoing symptoms. Also now for about 3 days has pain in her left side around the flank or middle and low back. This radiates around to the front. No dysuria, frequency, or hematuria reported. She does not recall any injury. No history of kidney stones or kidney infections.   Previous HPI 05/07/21 Maria Winters is a 62 y.o. female here for follow up for new seropositive RA labs at initial visit negative screening for  hepatitis and TB her CCP is highly positive and vitamin D low at 15. Xrays showed mild osteoarthritis in the feet otherwise unremarkable. She continues having significant joint pain symptoms affected hands and feet and fatigue. She notices more difficulty performing tasks at work such as pulling drawer handles or turning door knobs with pain or poor grip strength.   Previous HPI 04/21/21 KARSHA SCHEIDT is a 62 y.o. female here for evaluation of joint pain in multiple areas with elevated RF and sed rate. She reports joint pain starting since about a month ago. Pain is affecting many areas including shoulders, elbows, wrists, fingers, hips, knees, and ankles. She has some night time awakenings due to hand pain. Decreased grip strength is  affecting her daily activities including typing and driving. Swelling is most apparent in her hands and on the top of her feet around the base of the toes. She is stiff for about 3 hours each morning before getting some improvement in her mobility. She is not able to take NSAIDs due to anticoagulation for Afib she takes tylenol which helps but has to repeat this about every 8 hours for symptoms returning.   Labs reviewed 03/2021 RF 494 ESR 70   Review of Systems  Constitutional:  Negative for fatigue.  HENT:  Negative for mouth sores and mouth dryness.   Eyes:  Positive for dryness.  Respiratory:  Negative for shortness of breath.   Cardiovascular:  Positive for chest pain. Negative for palpitations.  Gastrointestinal:  Negative for blood in stool, constipation and diarrhea.  Endocrine: Positive for increased urination.  Genitourinary:  Negative for involuntary urination.  Musculoskeletal:  Positive for joint pain, gait problem, joint pain, joint swelling, myalgias, muscle weakness, morning stiffness, muscle tenderness and myalgias.  Skin:  Positive for hair loss and sensitivity to sunlight. Negative for color change and rash.  Allergic/Immunologic: Negative  for susceptible to infections.  Neurological:  Negative for dizziness and headaches.  Hematological:  Negative for swollen glands.  Psychiatric/Behavioral:  Positive for sleep disturbance. Negative for depressed mood. The patient is not nervous/anxious.     PMFS History:  Patient Active Problem List   Diagnosis Date Noted   Positive self-administered antigen test for COVID-19 11/30/2022   Sore throat 11/30/2022   Eye inflammation 11/01/2022   OSA (obstructive sleep apnea) 11/27/2021   Morbid obesity (HCC) 05/11/2021   Paroxysmal atrial fibrillation (HCC) 05/11/2021   Intermittent palpitations 05/11/2021   Excessive daytime sleepiness 05/11/2021   At risk for central sleep apnea 05/11/2021   Seropositive rheumatoid arthritis (HCC) 04/21/2021   Vitamin D deficiency 04/21/2021   High risk medication use 04/21/2021   Myalgia 03/20/2021   Vertigo 07/23/2019   Constipation 07/23/2019   Anxiety state 03/18/2016   Migraine equivalent 03/18/2016   Type 2 diabetes mellitus with other specified complication (HCC) 06/06/2015   S/P lap cholecystectomy July 2014 09/14/2012   GERD (gastroesophageal reflux disease) 04/28/2012    Past Medical History:  Diagnosis Date   Abnormal Pap smear of cervix    in her 20's-- hx of conization/LEEP procedure-normal paps since   Abnormal uterine bleeding    Anemia    ~2005   Arthritis    Atrial fibrillation (HCC)    Chest pain    Fibroid    GERD (gastroesophageal reflux disease)    Helicobacter pylori (H. pylori)    Psoriasis    Reflux    STD (sexually transmitted disease) 1994   Tx'd for Chlamydia   TMJ (temporomandibular joint syndrome)    Vision abnormalities    tunnel vision    Family History  Problem Relation Age of Onset   Diabetes Mother    Hypertension Father    Healthy Sister    Hypertension Sister    Hypertension Brother    Gout Brother    Heart attack Brother    Breast cancer Sister 20       lumpectomy   Sickle cell trait  Brother    Diabetes Maternal Grandmother        with 2 BKA   Stroke Maternal Grandmother    Breast cancer Maternal Aunt    Past Surgical History:  Procedure Laterality Date   cervix dilated     CHOLECYSTECTOMY N/A  09/13/2012   Procedure: LAPAROSCOPIC CHOLECYSTECTOMY WITH INTRAOPERATIVE CHOLANGIOGRAM;  Surgeon: Valarie Merino, MD;  Location: [facility];  Service: General;  Laterality: N/A;   DILATION AND CURETTAGE OF UTERUS     DILATION AND CURETTAGE, DIAGNOSTIC / THERAPEUTIC  1989   with Conization   ESOPHAGOGASTRODUODENOSCOPY N/A 05/11/2012   Procedure: ESOPHAGOGASTRODUODENOSCOPY (EGD);  Surgeon: Malissa Hippo, MD;  Location: [facility];  Service: Endoscopy;  Laterality: N/A;  125   LEEP     Social History   Social History Narrative   Not on file   Immunization History  Administered Date(s) Administered   Influenza Split 04/19/2012   Influenza,inj,Quad PF,6+ Mos 01/24/2015, 12/23/2017, 12/07/2019, 03/20/2021, 01/07/2022   PFIZER(Purple Top)SARS-COV-2 Vaccination 06/16/2019, 07/07/2019, 01/26/2020   Pneumococcal Conjugate-13 08/31/2016   Pneumococcal Polysaccharide-23 12/07/2019   Tdap 08/31/2016   Zoster Recombinant(Shingrix) 03/06/2019, 05/18/2019     Objective: Vital Signs: BP 120/75 (BP Location: Left Arm, Patient Position: Sitting, Cuff Size: Normal)   Pulse 73   Resp 14   Ht 5' 5.5" (1.664 m)   Wt 245 lb (111.1 kg)   LMP 03/06/2021 (Exact Date)   BMI 40.15 kg/m    Physical Exam Constitutional:      Appearance: She is obese.  Cardiovascular:     Rate and Rhythm: Normal rate and regular rhythm.  Pulmonary:     Effort: Pulmonary effort is normal.     Breath sounds: Normal breath sounds.  Skin:    General: Skin is warm and dry.  Neurological:     Mental Status: She is alert.  Psychiatric:        Mood and Affect: Mood normal.      Musculoskeletal Exam:  Shoulders full ROM no tenderness or swelling Elbows full ROM no tenderness or swelling Wrists full  ROM no tenderness or swelling Fingers full ROM tenderness to pressure at right 2nd through 4th MCPs without palpable synovitis Lateral hip tenderness to pressure on both sides Knees full ROM no tenderness or swelling Left ankle pain palpation and range of motion, swelling present on lateral side   Investigation: No additional findings.  Imaging: No results found.  Recent Labs: Lab Results  Component Value Date   WBC 7.0 02/01/2023   HGB 11.4 (L) 02/01/2023   PLT 468 (H) 02/01/2023   NA 140 02/01/2023   K 4.3 02/01/2023   CL 102 02/01/2023   CO2 32 02/01/2023   GLUCOSE 141 (H) 02/01/2023   BUN 7 02/01/2023   CREATININE 0.74 02/01/2023   BILITOT 0.4 02/01/2023   ALKPHOS 84 06/03/2022   AST 13 02/01/2023   ALT 13 02/01/2023   PROT 6.7 02/01/2023   ALBUMIN 3.9 06/03/2022   CALCIUM 9.2 02/01/2023   GFRAA 83 08/24/2019   QFTBGOLDPLUS NEGATIVE 04/21/2021    Speciality Comments: Labs not drawn 5/22 due to currently transitioning insurance, plans to have collected at local [facility] once she has the card  Procedures:  No procedures performed Allergies: Esomeprazole magnesium   Assessment / Plan:     Visit Diagnoses: Seropositive rheumatoid arthritis (HCC) - Plan: Sedimentation rate, methotrexate (RHEUMATREX) 2.5 MG tablet  Complaints are concerning for continued disease activity although there is not severe synovitis on exam today.  Had upgoing trend in sedimentation rate from previous appointment labs we will recheck this today.  I think she would be likely to benefit with addition of biologic DMARD as she is already maximized safe long-term methotrexate dose and is having flares with little to no provocation.  Will continue methotrexate 25 mg p.o. weekly folic acid 1 mg daily.  Plan to start on adalimumab 40 mg subcu q. 14 days.  High risk medication use - methotrexate 25 mg p.o. weekly and folic acid 1 mg daily. - Plan: CBC with Differential/Platelet, COMPLETE METABOLIC PANEL WITH  GFR, QuantiFERON-TB Gold Plus  Checking CBC and CMP for medication monitoring on continued long-term use of methotrexate.  Also checking QuantiFERON is baseline screening anticipating starting adalimumab.  Reviewed risk of medication including injection site reactions, infections, malignancy risk with long-term treatment.  Does not have any personal history of congestive heart failure.  No personal history of skin cancer or lymphoma.   Orders: Orders Placed This Encounter  Procedures   Sedimentation rate   CBC with Differential/Platelet   COMPLETE METABOLIC PANEL WITH GFR   QuantiFERON-TB Gold Plus   Meds ordered this encounter  Medications   methotrexate (RHEUMATREX) 2.5 MG tablet    Sig: Take 10 tablets (25 mg total) by mouth once a week. Caution:Chemotherapy. Protect from light.    Dispense:  130 tablet    Refill:  0     Follow-Up Instructions: Return in about 3 months (around 05/04/2023) for RA on MTX/ADA start f/u 3mos.   Fuller Plan, MD  Note - This record has been created using [employer].  Chart creation errors have been sought, but may not always  have been located. Such creation errors do not reflect on  the standard of medical care.

## 2023-02-01 ENCOUNTER — Encounter: Payer: Self-pay | Admitting: Internal Medicine

## 2023-02-01 ENCOUNTER — Ambulatory Visit: Payer: Medicaid Other | Attending: Internal Medicine | Admitting: Internal Medicine

## 2023-02-01 VITALS — BP 120/75 | HR 73 | Resp 14 | Ht 65.5 in | Wt 245.0 lb

## 2023-02-01 DIAGNOSIS — H5789 Other specified disorders of eye and adnexa: Secondary | ICD-10-CM | POA: Diagnosis not present

## 2023-02-01 DIAGNOSIS — M059 Rheumatoid arthritis with rheumatoid factor, unspecified: Secondary | ICD-10-CM | POA: Diagnosis not present

## 2023-02-01 DIAGNOSIS — Z79899 Other long term (current) drug therapy: Secondary | ICD-10-CM | POA: Diagnosis not present

## 2023-02-01 MED ORDER — METHOTREXATE SODIUM 2.5 MG PO TABS
25.0000 mg | ORAL_TABLET | ORAL | 0 refills | Status: DC
Start: 2023-02-01 — End: 2023-05-24

## 2023-02-01 NOTE — Patient Instructions (Signed)
Adalimumab Injection What is this medication? ADALIMUMAB (ay da LIM yoo mab) treats autoimmune conditions, such as psoriasis, arthritis, Crohn's disease, and ulcerative colitis. It works by slowing down an overactive immune system. It belongs to a group of medications called TNF inhibitors. It is a monoclonal antibody. This medicine may be used for other purposes; ask your health care provider or pharmacist if you have questions. COMMON BRAND NAME(S): ABRILADA, AMJEVITA, CYLTEZO, HADLIMA, Hulio, Hulio PEN, Humira, HUMIRA PEN, Hyrimoz, Idacio, Simlandi, Yuflyma, YUSIMRY What should I tell my care team before I take this medication? They need to know if you have any of these conditions: Cancer Diabetes (high blood sugar) Having surgery Heart disease Hepatitis B Immune system problems Infections, such as tuberculosis (TB) or other bacterial, fungal, or viral infections Multiple sclerosis Recent or upcoming vaccine An unusual or allergic reaction to adalimumab, mannitol, latex, rubber, other medications, foods, dyes, or preservatives Pregnant or trying to get pregnant Breast-feeding How should I use this medication? This medication is injected under the skin. It may be given by your care team in a hospital or clinic setting. It may also be given at home. If you get this medication at home, you will be taught how to prepare and give it. Use exactly as directed. Take it as directed on the prescription label. Keep taking it unless your care team tells you to stop. This medication comes with INSTRUCTIONS FOR USE. Ask your pharmacist for directions on how to use this medication. Read the information carefully. Talk to your pharmacist or care team if you have questions. It is important that you put your used needles and syringes in a special sharps container. Do not put them in a trash can. If you do not have a sharps container, call your pharmacist or care team to get one. A special MedGuide will be  given to you by the pharmacist with each prescription and refill. If you are getting this medication in a hospital or clinic, a special MedGuide will be given to you before each treatment. Be sure to read this information carefully each time. Talk to your care team about the use of this medication in children. While it be prescribed for children as young as 2 years for selected conditions, precautions do apply. Overdosage: If you think you have taken too much of this medicine contact a poison control center or emergency room at once. NOTE: This medicine is only for you. Do not share this medicine with others. What if I miss a dose? If you get this medication at the hospital or clinic: it is important not to miss your dose. Call your care team if you are unable to keep an appointment. If you give yourself this medication at home: If you miss a dose, take it as soon as you can. If it is almost time for your next dose, take only that dose. Do not take double or extra doses. Call your care team with questions. What may interact with this medication? Do not take this medication with any of the following: Abatacept Anakinra Biologic medications, such as certolizumab, etanercept, golimumab, infliximab Live virus vaccines This medication may also interact with the following: Cyclosporine Theophylline Vaccines Warfarin This list may not describe all possible interactions. Give your health care provider a list of all the medicines, herbs, non-prescription drugs, or dietary supplements you use. Also tell them if you smoke, drink alcohol, or use illegal drugs. Some items may interact with your medicine. What should I watch for while  using this medication? Visit your care team for regular checks on your progress. Tell your care team if your symptoms do not start to get better or if they get worse. You will be tested for tuberculosis (TB) before you start this medication. If your care team prescribes any  medication for TB, you should start taking the TB medication before starting this medication. Make sure to finish the full course of TB medication. This medication may increase your risk of getting an infection. Call your care team for advice if you get a fever, chills, sore throat, or other symptoms of a cold or flu. Do not treat yourself. Try to avoid being around people who are sick. Talk to your care team about your risk of cancer. You may be more at risk for certain types of cancer if you take this medication. What side effects may I notice from receiving this medication? Side effects that you should report to your care team as soon as possible: Allergic reactions--skin rash, itching, hives, swelling of the face, lips, tongue, or throat Aplastic anemia--unusual weakness or fatigue, dizziness, headache, trouble breathing, increased bleeding or bruising Body pain, tingling, or numbness Heart failure--shortness of breath, swelling of the ankles, feet, or hands, sudden weight gain, unusual weakness or fatigue Infection--fever, chills, cough, sore throat, wounds that don't heal, pain or trouble when passing urine, general feeling of discomfort or being unwell Lupus-like syndrome--joint pain, swelling, or stiffness, butterfly-shaped rash on the face, rashes that get worse in the sun, fever, unusual weakness or fatigue Unusual bruising or bleeding Side effects that usually do not require medical attention (report to your care team if they continue or are bothersome): Headache Nausea Pain, redness, or irritation at injection site Runny or stuffy nose Sore throat Stomach pain This list may not describe all possible side effects. Call your doctor for medical advice about side effects. You may report side effects to FDA at 1-800-FDA-1088. Where should I keep my medication? Keep out of the reach of children and pets. Store in the refrigerator. Do not freeze. Keep this medication in the original  packaging until you are ready to take it. Protect from light. Get rid of any unused medication after the expiration date. This medication may be stored at room temperature for up to 14 days. Keep this medication in the original packaging. Protect from light. If it is stored at room temperature, get rid of any unused medication after 14 days or after it expires, whichever is first. To get rid of medications that are no longer needed or have expired: Take the medication to a medication take-back program. Check with your pharmacy or law enforcement to find a location. If you cannot return the medication, ask your pharmacist or care team how to get rid of this medication safely. NOTE: This sheet is a summary. It may not cover all possible information. If you have questions about this medicine, talk to your doctor, pharmacist, or health care provider.  2024 Elsevier/Gold Standard (2021-05-08 00:00:00)

## 2023-02-03 LAB — COMPLETE METABOLIC PANEL WITH GFR
AG Ratio: 1 (calc) (ref 1.0–2.5)
ALT: 13 U/L (ref 6–29)
AST: 13 U/L (ref 10–35)
Albumin: 3.4 g/dL — ABNORMAL LOW (ref 3.6–5.1)
Alkaline phosphatase (APISO): 84 U/L (ref 37–153)
BUN: 7 mg/dL (ref 7–25)
CO2: 32 mmol/L (ref 20–32)
Calcium: 9.2 mg/dL (ref 8.6–10.4)
Chloride: 102 mmol/L (ref 98–110)
Creat: 0.74 mg/dL (ref 0.50–1.05)
Globulin: 3.3 g/dL (ref 1.9–3.7)
Glucose, Bld: 141 mg/dL — ABNORMAL HIGH (ref 65–99)
Potassium: 4.3 mmol/L (ref 3.5–5.3)
Sodium: 140 mmol/L (ref 135–146)
Total Bilirubin: 0.4 mg/dL (ref 0.2–1.2)
Total Protein: 6.7 g/dL (ref 6.1–8.1)
eGFR: 91 mL/min/{1.73_m2} (ref >=60)

## 2023-02-03 LAB — SEDIMENTATION RATE: Sed Rate: 86 mm/h — ABNORMAL HIGH (ref 0–30)

## 2023-02-03 LAB — CBC WITH DIFFERENTIAL/PLATELET
Absolute Lymphocytes: 2198 {cells}/uL (ref 850–3900)
Absolute Monocytes: 378 {cells}/uL (ref 200–950)
Basophils Absolute: 77 {cells}/uL (ref 0–200)
Basophils Relative: 1.1 %
Eosinophils Absolute: 252 {cells}/uL (ref 15–500)
Eosinophils Relative: 3.6 %
HCT: 35.2 % (ref 35.0–45.0)
Hemoglobin: 11.4 g/dL — ABNORMAL LOW (ref 11.7–15.5)
MCH: 28.1 pg (ref 27.0–33.0)
MCHC: 32.4 g/dL (ref 32.0–36.0)
MCV: 86.7 fL (ref 80.0–100.0)
MPV: 8.9 fL (ref 7.5–12.5)
Monocytes Relative: 5.4 %
Neutro Abs: 4095 {cells}/uL (ref 1500–7800)
Neutrophils Relative %: 58.5 %
Platelets: 468 10*3/uL — ABNORMAL HIGH (ref 140–400)
RBC: 4.06 10*6/uL (ref 3.80–5.10)
RDW: 13.3 % (ref 11.0–15.0)
Total Lymphocyte: 31.4 %
WBC: 7 10*3/uL (ref 3.8–10.8)

## 2023-02-03 LAB — QUANTIFERON-TB GOLD PLUS
Mitogen-NIL: 2.45 [IU]/mL
NIL: 0.03 [IU]/mL
QuantiFERON-TB Gold Plus: NEGATIVE
TB1-NIL: 0.01 [IU]/mL
TB2-NIL: 0 [IU]/mL

## 2023-02-23 ENCOUNTER — Ambulatory Visit: Payer: Medicaid Other | Admitting: Cardiology

## 2023-03-04 ENCOUNTER — Ambulatory Visit (INDEPENDENT_AMBULATORY_CARE_PROVIDER_SITE_OTHER): Payer: Medicaid Other

## 2023-03-04 ENCOUNTER — Ambulatory Visit: Payer: Medicaid Other

## 2023-03-04 ENCOUNTER — Other Ambulatory Visit (HOSPITAL_COMMUNITY): Payer: Self-pay | Admitting: Family

## 2023-03-04 DIAGNOSIS — Z1231 Encounter for screening mammogram for malignant neoplasm of breast: Secondary | ICD-10-CM

## 2023-03-04 DIAGNOSIS — E1169 Type 2 diabetes mellitus with other specified complication: Secondary | ICD-10-CM | POA: Diagnosis not present

## 2023-03-04 NOTE — Progress Notes (Unsigned)
Maria Winters arrived 03/04/2023 and has given verbal consent to obtain images and complete their overdue diabetic retinal screening.  The images have been sent to an ophthalmologist or optometrist for review and interpretation.  Results will be sent back to Maria Spencer, FNP for review.  Patient has been informed they will be contacted when we receive the results via telephone or MyChart

## 2023-03-10 ENCOUNTER — Ambulatory Visit: Payer: Medicaid Other | Admitting: Physician Assistant

## 2023-03-14 ENCOUNTER — Ambulatory Visit (HOSPITAL_COMMUNITY): Payer: Medicaid Other

## 2023-03-24 ENCOUNTER — Ambulatory Visit (HOSPITAL_COMMUNITY)
Admission: RE | Admit: 2023-03-24 | Discharge: 2023-03-24 | Disposition: A | Discharge status: Home / Self Care | Payer: Medicaid Other | Source: Ambulatory Visit | Attending: Family | Admitting: Family

## 2023-03-24 DIAGNOSIS — Z1231 Encounter for screening mammogram for malignant neoplasm of breast: Secondary | ICD-10-CM | POA: Diagnosis not present

## 2023-03-27 ENCOUNTER — Other Ambulatory Visit: Payer: Self-pay | Admitting: Cardiology

## 2023-03-28 NOTE — Telephone Encounter (Signed)
Prescription refill request for Eliquis received. Indication:afib Last office visit:3/24 Scr:0.74  11/24 Age: 63 Weight:111.1  kg  Prescription refilled

## 2023-04-21 NOTE — Progress Notes (Deleted)
 Office Visit Note  Patient: Maria Winters             Date of Birth: 1960-11-03           MRN: [id number]             PCP: Junie Spencer, FNP Referring: Junie Spencer, FNP Visit Date: 05/05/2023   Subjective:  No chief complaint on file.   History of Present Illness: Maria Winters is a 63 y.o. female here for follow up for seropositive RA on methotrexate increased to 25 mg p.o. weekly since last visit and on folic acid 1 mg daily.    Previous HPI 02/01/2023 Maria Winters is a 63 y.o. female here for follow up for seropositive RA on methotrexate increased to 25 mg p.o. weekly since last visit and on folic acid 1 mg daily.  Overall symptoms have been doing worse recently.  Especially has been experiencing increased pain in her hips and at her ankles in the past few weeks.  This is bad enough that she cannot bear weight on her left foot a couple occasions during the past 4 days.  She is also noticed pain and swelling in the fingers usually affecting 2 or 3 fingers at a time.  She did not notice any change in activity no new illness or injury.  The cold weather does seem to at least somewhat aggravate symptoms.  She has noticed some generalized hair thinning wonders if this is related to the medication.   Previous HPI 11/01/2022 Maria Winters is a 63 y.o. female here for follow up for seropositive RA on methotrexate increased to 25 mg p.o. weekly since last visit and on folic acid 1 mg daily.  Since her last visit she had a few flareups of increased symptoms with bilateral hand shoulder and feet pain.  Also associated with episode of TMJ and these were acting up worse in June or early July she thinks associated with weather changes.  Since then come back down currently does not have any prolonged morning stiffness or joint swelling.  Last follow-up with her eye doctor they reported there was some inflammation thought to be related with her rheumatoid arthritis.  She was  prescribed Restasis for this to use in addition to artificial tears.   Previous HPI 07/28/2022 Maria Winters is a 63 y.o. female here for follow up for seropositive RA on methotrexate increased to 25 mg p.o. weekly since last visit and on folic acid 1 mg daily.  She had several events since her last visit she officially retired and so had a transition in insurance coverage which delayed her scheduling this follow-up appointment.  She was sick with upper respiratory infection symptoms and had a positive COVID test in February took molnupiravir for this.  She is now on semaglutide injection and has worked on cutting out sweets and caffeine from her diet as well as consistent daily exercise and nighttime use of CPAP.  Overall she feels very well with less fatigue and inflammation than she has had in the past several years.   Previous HPI 03/31/22 Maria Winters is a 63 y.o. female here for follow up of rheumatoid arthritis on methotrexate 20 mg p.o. weekly and folic acid 1 mg daily.  She has increased problems since a flareup started in her right foot on January 9.  She experiences migratory joint inflammation mostly in 1 joint at a time starting in the right foot proceeded to eventually  involve both feet both hands and both shoulders each spot lasting for about 2 or 3 days at a time.  Currently symptoms are improved again without any swollen area.  She did not recall any kind of illness injury or change in activity prior to this episode.  She has not been taking any specific medication for but is the spending all weekend resting and elevating her feet but symptoms worsening and during the week while having to be more active.   Previous HPI 01/25/22 Maria Winters is a 63 y.o. female here for follow up for seropositive RA  on MTX 15 mg PO weekly and folic acid 1 mg daily.  She has been mostly doing well since her last visit without significant daily peripheral joint pains or swelling.  Does get pain  in her knees somewhat limiting for prolonged walking standing and low back pains in a fixed seated position.  She is noticing pain in her feet on both sides most often after driving.  Had some episodic increase in pain at her hands and elbows improving on their own within about 2 weeks.  Also recently went to the emergency department a few weeks ago due to onset of left-sided jaw pain.  Evaluation was reassuring against cardiac etiology thought to be temporomandibular joint pain.  She completed a steroid taper and low-dose hydrocodone symptoms have resolved.   Previous HPI 10/20/2021 Maria Winters is a 63 y.o. female here for follow up for seropositive RA on methotrexate 15 mg p.o. weekly.  She seen a sizable improvement in much of her joint symptoms and has not experienced any trouble taking the medication.  She is having some pain in the right hip most often occurred when sitting in 1 position for prolonged time usually about 30 minutes or longer provokes this.  Otherwise joint problems are mostly doing better and she is not seeing any ongoing swelling changes.   Previous HPI  06/22/2021 Maria Winters is a 63 y.o. female here for follow up for seropositive RA after starting methotrexate 15 mg PO weekly. Also increase vitamin D supplementation to 5000 units daily. She misunderstood instructions so started taking methotrexate only 1 tablet weekly for 2 weeks then increased to 6 tablets as planned for the past 2 weeks. She has some diarrhea with the medication but not severe or lasting continuously. She had increased in bilateral heel pain worst when getting up to walk initially, the left side has improved but right side ongoing symptoms. Also now for about 3 days has pain in her left side around the flank or middle and low back. This radiates around to the front. No dysuria, frequency, or hematuria reported. She does not recall any injury. No history of kidney stones or kidney infections.   Previous  HPI 05/07/21 Maria Winters is a 64 y.o. female here for follow up for new seropositive RA labs at initial visit negative screening for hepatitis and TB her CCP is highly positive and vitamin D low at 15. Xrays showed mild osteoarthritis in the feet otherwise unremarkable. She continues having significant joint pain symptoms affected hands and feet and fatigue. She notices more difficulty performing tasks at work such as pulling drawer handles or turning door knobs with pain or poor grip strength.   Previous HPI 04/21/21 Maria Winters is a 63 y.o. female here for evaluation of joint pain in multiple areas with elevated RF and sed rate. She reports joint pain starting since about a month  ago. Pain is affecting many areas including shoulders, elbows, wrists, fingers, hips, knees, and ankles. She has some night time awakenings due to hand pain. Decreased grip strength is affecting her daily activities including typing and driving. Swelling is most apparent in her hands and on the top of her feet around the base of the toes. She is stiff for about 3 hours each morning before getting some improvement in her mobility. She is not able to take NSAIDs due to anticoagulation for Afib she takes tylenol which helps but has to repeat this about every 8 hours for symptoms returning.   Labs reviewed 03/2021 RF 494 ESR 70   No Rheumatology ROS completed.   PMFS History:  Patient Active Problem List   Diagnosis Date Noted   Positive self-administered antigen test for COVID-19 11/30/2022   Sore throat 11/30/2022   Eye inflammation 11/01/2022   OSA (obstructive sleep apnea) 11/27/2021   Morbid obesity (HCC) 05/11/2021   Paroxysmal atrial fibrillation (HCC) 05/11/2021   Intermittent palpitations 05/11/2021   Excessive daytime sleepiness 05/11/2021   At risk for central sleep apnea 05/11/2021   Seropositive rheumatoid arthritis (HCC) 04/21/2021   Vitamin D deficiency 04/21/2021   High risk medication use  04/21/2021   Myalgia 03/20/2021   Vertigo 07/23/2019   Constipation 07/23/2019   Anxiety state 03/18/2016   Migraine equivalent 03/18/2016   Type 2 diabetes mellitus with other specified complication (HCC) 06/06/2015   S/P lap cholecystectomy July 2014 09/14/2012   GERD (gastroesophageal reflux disease) 04/28/2012    Past Medical History:  Diagnosis Date   Abnormal Pap smear of cervix    in her 20's-- hx of conization/LEEP procedure-normal paps since   Abnormal uterine bleeding    Anemia    ~2005   Arthritis    Atrial fibrillation (HCC)    Chest pain    Fibroid    GERD (gastroesophageal reflux disease)    Helicobacter pylori (H. pylori)    Psoriasis    Reflux    STD (sexually transmitted disease) 1994   Tx'd for Chlamydia   TMJ (temporomandibular joint syndrome)    Vision abnormalities    tunnel vision    Family History  Problem Relation Age of Onset   Diabetes Mother    Hypertension Father    Healthy Sister    Hypertension Sister    Hypertension Brother    Gout Brother    Heart attack Brother    Breast cancer Sister 68       lumpectomy   Sickle cell trait Brother    Diabetes Maternal Grandmother        with 2 BKA   Stroke Maternal Grandmother    Breast cancer Maternal Aunt    Past Surgical History:  Procedure Laterality Date   cervix dilated     CHOLECYSTECTOMY N/A 09/13/2012   Procedure: LAPAROSCOPIC CHOLECYSTECTOMY WITH INTRAOPERATIVE CHOLANGIOGRAM;  Surgeon: Valarie Merino, MD;  Location: [facility];  Service: General;  Laterality: N/A;   DILATION AND CURETTAGE OF UTERUS     DILATION AND CURETTAGE, DIAGNOSTIC / THERAPEUTIC  1989   with Conization   ESOPHAGOGASTRODUODENOSCOPY N/A 05/11/2012   Procedure: ESOPHAGOGASTRODUODENOSCOPY (EGD);  Surgeon: Malissa Hippo, MD;  Location: [facility];  Service: Endoscopy;  Laterality: N/A;  125   LEEP     Social History   Social History Narrative   Not on file   Immunization History  Administered Date(s)  Administered   Influenza Split 04/19/2012   Influenza,inj,Quad PF,6+ Mos 01/24/2015,  12/23/2017, 12/07/2019, 03/20/2021, 01/07/2022   PFIZER(Purple Top)SARS-COV-2 Vaccination 06/16/2019, 07/07/2019, 01/26/2020   Pneumococcal Conjugate-13 08/31/2016   Pneumococcal Polysaccharide-23 12/07/2019   Tdap 08/31/2016   Zoster Recombinant(Shingrix) 03/06/2019, 05/18/2019     Objective: Vital Signs: LMP 03/06/2021 (Exact Date)    Physical Exam   Musculoskeletal Exam: ***  CDAI Exam: CDAI Score: -- Patient Global: --; Provider Global: -- Swollen: --; Tender: -- Joint Exam 05/05/2023   No joint exam has been documented for this visit   There is currently no information documented on the homunculus. Go to the Rheumatology activity and complete the homunculus joint exam.  Investigation: No additional findings.  Imaging: MM 3D SCREENING MAMMOGRAM BILATERAL BREAST Result Date: 03/25/2023 CLINICAL DATA:  Screening. EXAM: DIGITAL SCREENING BILATERAL MAMMOGRAM WITH TOMOSYNTHESIS AND CAD TECHNIQUE: Bilateral screening digital craniocaudal and mediolateral oblique mammograms were obtained. Bilateral screening digital breast tomosynthesis was performed. The images were evaluated with computer-aided detection. COMPARISON:  Previous exam(s). ACR Breast Density Category a: The breasts are almost entirely fatty. FINDINGS: There are no findings suspicious for malignancy. IMPRESSION: No mammographic evidence of malignancy. A result letter of this screening mammogram will be mailed directly to the patient. RECOMMENDATION: Screening mammogram in one year. (Code:SM-B-01Y) BI-RADS CATEGORY  1: Negative. Electronically Signed   By: Amie Portland M.D.   On: 03/25/2023 15:31    Recent Labs: Lab Results  Component Value Date   WBC 7.0 02/01/2023   HGB 11.4 (L) 02/01/2023   PLT 468 (H) 02/01/2023   NA 140 02/01/2023   K 4.3 02/01/2023   CL 102 02/01/2023   CO2 32 02/01/2023   GLUCOSE 141 (H) 02/01/2023    BUN 7 02/01/2023   CREATININE 0.74 02/01/2023   BILITOT 0.4 02/01/2023   ALKPHOS 84 06/03/2022   AST 13 02/01/2023   ALT 13 02/01/2023   PROT 6.7 02/01/2023   ALBUMIN 3.9 06/03/2022   CALCIUM 9.2 02/01/2023   GFRAA 83 08/24/2019   QFTBGOLDPLUS NEGATIVE 02/01/2023    Speciality Comments: Labs not drawn 5/22 due to currently transitioning insurance, plans to have collected at local [facility] once she has the card  Procedures:  No procedures performed Allergies: Esomeprazole magnesium   Assessment / Plan:     Visit Diagnoses: No diagnosis found.  ***  Orders: No orders of the defined types were placed in this encounter.  No orders of the defined types were placed in this encounter.    Follow-Up Instructions: No follow-ups on file.   Metta Clines, RT  Note - This record has been created using [employer].  Chart creation errors have been sought, but may not always  have been located. Such creation errors do not reflect on  the standard of medical care.

## 2023-04-26 ENCOUNTER — Encounter: Payer: Self-pay | Admitting: Family

## 2023-04-26 ENCOUNTER — Telehealth: Payer: Medicaid Other | Admitting: Family

## 2023-04-26 DIAGNOSIS — R42 Dizziness and giddiness: Secondary | ICD-10-CM | POA: Diagnosis not present

## 2023-04-26 DIAGNOSIS — G4733 Obstructive sleep apnea (adult) (pediatric): Secondary | ICD-10-CM

## 2023-04-26 DIAGNOSIS — K219 Gastro-esophageal reflux disease without esophagitis: Secondary | ICD-10-CM | POA: Diagnosis not present

## 2023-04-26 DIAGNOSIS — E1169 Type 2 diabetes mellitus with other specified complication: Secondary | ICD-10-CM

## 2023-04-26 DIAGNOSIS — Z7985 Long-term (current) use of injectable non-insulin antidiabetic drugs: Secondary | ICD-10-CM | POA: Diagnosis not present

## 2023-04-26 DIAGNOSIS — K5900 Constipation, unspecified: Secondary | ICD-10-CM | POA: Diagnosis not present

## 2023-04-26 DIAGNOSIS — E559 Vitamin D deficiency, unspecified: Secondary | ICD-10-CM | POA: Diagnosis not present

## 2023-04-26 DIAGNOSIS — M059 Rheumatoid arthritis with rheumatoid factor, unspecified: Secondary | ICD-10-CM

## 2023-04-26 DIAGNOSIS — I480 Paroxysmal atrial fibrillation: Secondary | ICD-10-CM

## 2023-04-26 MED ORDER — SEMAGLUTIDE(0.25 OR 0.5MG/DOS) 2 MG/3ML ~~LOC~~ SOPN: 0.2500 mg | PEN_INJECTOR | SUBCUTANEOUS | 2 refills | Status: DC

## 2023-04-26 NOTE — Patient Instructions (Signed)
 Vertigo Vertigo is the feeling that you or the things around you are moving or spinning when they're not. It's different than feeling dizzy. It can also cause: Loss of balance. Trouble standing or walking. Nausea and vomiting. This feeling can come and go at any time. It can last from a few seconds to minutes or even hours. It may go away on its own or be treated with medicine. What are the types of vertigo? There are two types of vertigo: Peripheral vertigo happens when parts of your inner ear don't work like they should. This is the more common type. Central vertigo happens when your brain and spinal cord don't work like they should. Your health care provider will do tests to find out what kind of vertigo you have. This will help them decide on the right treatment for you. Follow these instructions at home: Eating and drinking Drink enough fluid to keep your pee (urine) pale yellow. Do not drink alcohol. Activity When you get up in the morning, first sit up on the side of the bed. When you feel okay, stand slowly while holding onto something. Move slowly. Avoid sudden body or head movements. Avoid certain positions, as told by your provider. Use a cane if you have trouble standing or walking. Sit down right away if you feel unsteady. Place items in your home so they're easy for you to reach without bending or leaning over. Return to normal activities when you're told. Ask what things are safe for you to do. General instructions Take your medicines only as told by your provider. Contact a health care provider if: Your medicines don't help or make your vertigo worse. You get new symptoms. You have a fever. You have nausea or vomiting. Your family or friends spot any changes in how you're acting. A part of your body goes numb. You feel tingling and prickling in a part of your body. You get very bad headaches. Get help right away if: You're always dizzy or you faint. You have a  stiff neck. You have trouble moving or speaking. Your hands, arms, or legs feel weak. Your hearing or eyesight changes. These symptoms may be an emergency. Call 911 right away. Do not wait to see if the symptoms will go away. Do not drive yourself to the hospital. This information is not intended to replace advice given to you by your health care provider. Make sure you discuss any questions you have with your health care provider. Document Revised: 11/25/2022 Document Reviewed: 05/28/2022 Elsevier Patient Education  2024 [employer].

## 2023-04-26 NOTE — Progress Notes (Signed)
 Virtual Visit Consent   Maria Winters, you are scheduled for a virtual visit with a [facility] provider today. Just as with appointments in the office, your consent must be obtained to participate. Your consent will be active for this visit and any virtual visit you may have with one of our providers in the next 365 days. If you have a MyChart account, a copy of this consent can be sent to you electronically.  As this is a virtual visit, video technology does not allow for your provider to perform a traditional examination. This may limit your provider's ability to fully assess your condition. If your provider identifies any concerns that need to be evaluated in person or the need to arrange testing (such as labs, EKG, etc.), we will make arrangements to do so. Although advances in technology are sophisticated, we cannot ensure that it will always work on either your end or our end. If the connection with a video visit is poor, the visit may have to be switched to a telephone visit. With either a video or telephone visit, we are not always able to ensure that we have a secure connection.  By engaging in this virtual visit, you consent to the provision of healthcare and authorize for your insurance to be billed (if applicable) for the services provided during this visit. Depending on your insurance coverage, you may receive a charge related to this service.  I need to obtain your verbal consent now. Are you willing to proceed with your visit today? Maria Winters has provided verbal consent on 04/26/2023 for a virtual visit (video or telephone). Jannifer Rodney, FNP  Date: 04/26/2023 11:24 AM   Virtual Visit via Video Note   I, Jannifer Rodney, connected with  Maria Winters  ([id number], 1961-03-01) on 04/26/23 at 11:10 AM EST by a video-enabled telemedicine application and verified that I am speaking with the correct person using two identifiers.  Location: Patient: Virtual Visit Location  Patient: Home Provider: Virtual Visit Location Provider: Home Office   I discussed the limitations of evaluation and management by telemedicine and the availability of in person appointments. The patient expressed understanding and agreed to proceed.    History of Present Illness: Maria Winters is a 63 y.o. who identifies as a female who was assigned female at birth, and is being seen today for chronic follow up.  She is following a Cardiologists for A Fib, and intermittent palpitations. She is taking Eliquis BID.    She is followed by Rheumatologists every 3 months for RA.     She has OSA and uses CPAP nightly.   HPI: Gastroesophageal Reflux She complains of belching and heartburn. This is a chronic problem. The current episode started more than 1 year ago. The problem occurs occasionally. The symptoms are aggravated by certain foods.  Diabetes She presents for her follow-up diabetic visit. She has type 2 diabetes mellitus. Hypoglycemia symptoms include dizziness. Pertinent negatives for diabetes include no blurred vision and no foot paresthesias. Symptoms are stable. Pertinent negatives for diabetic complications include no peripheral neuropathy. Risk factors for coronary artery disease include dyslipidemia, diabetes mellitus, hypertension, sedentary lifestyle and post-menopausal. She is following a generally healthy diet. Her overall blood glucose range is 110-130 mg/dl.  Constipation This is a chronic problem. The current episode started more than 1 year ago. The problem has been resolved since onset. Risk factors include obesity. She has tried laxatives for the symptoms. The treatment provided moderate relief.  Dizziness  This is a chronic problem. The current episode started more than 1 year ago. The problem occurs intermittently. The problem has been waxing and waning. Treatments tried: antivert. The treatment provided moderate relief.     Problems:  Patient Active Problem List    Diagnosis Date Noted   Eye inflammation 11/01/2022   OSA (obstructive sleep apnea) 11/27/2021   Morbid obesity (HCC) 05/11/2021   Paroxysmal atrial fibrillation (HCC) 05/11/2021   Intermittent palpitations 05/11/2021   Excessive daytime sleepiness 05/11/2021   At risk for central sleep apnea 05/11/2021   Seropositive rheumatoid arthritis (HCC) 04/21/2021   Vitamin D deficiency 04/21/2021   High risk medication use 04/21/2021   Myalgia 03/20/2021   Vertigo 07/23/2019   Constipation 07/23/2019   Anxiety state 03/18/2016   Migraine equivalent 03/18/2016   Type 2 diabetes mellitus with other specified complication (HCC) 06/06/2015   S/P lap cholecystectomy July 2014 09/14/2012   GERD (gastroesophageal reflux disease) 04/28/2012    Allergies:  Allergies  Allergen Reactions   Esomeprazole Magnesium     Patient states that the Nexium caused tremors   Medications:  Current Outpatient Medications:    Semaglutide,0.25 or 0.5MG /DOS, 2 MG/3ML SOPN, Inject 0.25 mg into the skin once a week., Disp: 3 mL, Rfl: 2   acetaminophen (TYLENOL) 325 MG tablet, Take 650 mg by mouth every 6 (six) hours as needed., Disp: , Rfl:    apixaban (ELIQUIS) 5 MG TABS tablet, TAKE 1 TABLET BY MOUTH TWICE A DAY, Disp: 60 tablet, Rfl: 5   Cholecalciferol (VITAMIN D) 125 MCG (5000 UT) CAPS, Take by mouth., Disp: , Rfl:    dexamethasone 0.5 MG/5ML elixir, Take 0.5 mg by mouth 4 (four) times daily., Disp: , Rfl:    fluocinonide gel (LIDEX) 0.05 %, Apply topically 4 (four) times daily., Disp: , Rfl:    folic acid (FOLVITE) 1 MG tablet, Take 1 tablet (1 mg total) by mouth daily., Disp: 90 tablet, Rfl: 3   meclizine (ANTIVERT) 25 MG tablet, Take 25 mg by mouth 3 (three) times daily as needed for dizziness., Disp: , Rfl:    methotrexate (RHEUMATREX) 2.5 MG tablet, Take 10 tablets (25 mg total) by mouth once a week. Caution:Chemotherapy. Protect from light., Disp: 130 tablet, Rfl: 0   metoprolol succinate (TOPROL-XL) 25 MG  24 hr tablet, Take 1 tablet (25 mg total) by mouth daily., Disp: 90 tablet, Rfl: 3   polyethylene glycol (MIRALAX / GLYCOLAX) 17 g packet, Take 17 g by mouth daily., Disp: , Rfl:    RESTASIS 0.05 % ophthalmic emulsion, 1 drop 2 (two) times daily., Disp: , Rfl:   Observations/Objective: Patient is well-developed, well-nourished in no acute distress.  Resting comfortably  at home.  Head is normocephalic, atraumatic.  No labored breathing.  Speech is clear and coherent with logical content.  Patient is alert and oriented at baseline.    Assessment and Plan: 1. Type 2 diabetes mellitus with other specified complication, without long-term current use of insulin (HCC) (Primary) - Semaglutide,0.25 or 0.5MG /DOS, 2 MG/3ML SOPN; Inject 0.25 mg into the skin once a week.  Dispense: 3 mL; Refill: 2 - Bayer DCA Hb A1c Waived; Future - CBC with Differential/Platelet; Future - CMP14+EGFR; Future  2. Vertigo - CBC with Differential/Platelet; Future - CMP14+EGFR; Future  3. Vitamin D deficiency - CBC with Differential/Platelet; Future - CMP14+EGFR; Future  4. Seropositive rheumatoid arthritis (HCC) - CBC with Differential/Platelet; Future - CMP14+EGFR; Future  5. Paroxysmal atrial fibrillation (HCC) - CBC with Differential/Platelet; Future -  CMP14+EGFR; Future  6. OSA (obstructive sleep apnea) - CBC with Differential/Platelet; Future - CMP14+EGFR; Future  7. Morbid obesity (HCC) - CBC with Differential/Platelet; Future - CMP14+EGFR; Future  8. Gastroesophageal reflux disease without esophagitis - CBC with Differential/Platelet; Future - CMP14+EGFR; Future  9. Constipation, unspecified constipation type - CBC with Differential/Platelet; Future - CMP14+EGFR; Future  Will start Ozempic 0.25 mg today ,higher doses make her nauseous  Low carb diet Continue current medications  Pt will come in and get lab work  Follow up in 3 months    Follow Up Instructions: I discussed the  assessment and treatment plan with the patient. The patient was provided an opportunity to ask questions and all were answered. The patient agreed with the plan and demonstrated an understanding of the instructions.  A copy of instructions were sent to the patient via MyChart unless otherwise noted below.     The patient was advised to call back or seek an in-person evaluation if the symptoms worsen or if the condition fails to improve as anticipated.    Jannifer Rodney, FNP

## 2023-04-28 ENCOUNTER — Telehealth: Payer: Self-pay | Admitting: Pharmacy Technician

## 2023-04-28 ENCOUNTER — Other Ambulatory Visit (HOSPITAL_COMMUNITY): Payer: Self-pay

## 2023-04-28 NOTE — Telephone Encounter (Signed)
 Pharmacy Patient Advocate Encounter  Received notification from [facility] that Prior Authorization for Ozempic (0.25 or 0.5 MG/DOSE) 2MG /3ML pen-injectors  has been APPROVED from 04/28/2023 to 04/27/2024. Ran test claim, Copay is $4.00. This test claim was processed through [facility]- copay amounts may vary at other pharmacies due to pharmacy/plan contracts, or as the patient moves through the different stages of their insurance plan.   PA #/Case ID/Reference #: [id number]

## 2023-04-28 NOTE — Telephone Encounter (Signed)
 Pharmacy Patient Advocate Encounter   Received notification from CoverMyMeds that prior authorization for Ozempic (0.25 or 0.5 MG/DOSE) 2MG /3ML pen-injectors is required/requested.   Insurance verification completed.   The patient is insured through [facility] .   Per test claim: PA required; PA submitted to above mentioned insurance via CoverMyMeds Key/confirmation #/EOC [id number] Status is pending

## 2023-05-02 ENCOUNTER — Other Ambulatory Visit: Payer: Self-pay | Admitting: Family

## 2023-05-02 ENCOUNTER — Other Ambulatory Visit: Payer: Medicaid Other

## 2023-05-02 DIAGNOSIS — E1169 Type 2 diabetes mellitus with other specified complication: Secondary | ICD-10-CM | POA: Diagnosis not present

## 2023-05-02 DIAGNOSIS — G4733 Obstructive sleep apnea (adult) (pediatric): Secondary | ICD-10-CM | POA: Diagnosis not present

## 2023-05-02 DIAGNOSIS — M059 Rheumatoid arthritis with rheumatoid factor, unspecified: Secondary | ICD-10-CM

## 2023-05-02 DIAGNOSIS — K5900 Constipation, unspecified: Secondary | ICD-10-CM | POA: Diagnosis not present

## 2023-05-02 DIAGNOSIS — R42 Dizziness and giddiness: Secondary | ICD-10-CM

## 2023-05-02 DIAGNOSIS — I480 Paroxysmal atrial fibrillation: Secondary | ICD-10-CM | POA: Diagnosis not present

## 2023-05-02 DIAGNOSIS — E559 Vitamin D deficiency, unspecified: Secondary | ICD-10-CM | POA: Diagnosis not present

## 2023-05-02 DIAGNOSIS — K219 Gastro-esophageal reflux disease without esophagitis: Secondary | ICD-10-CM

## 2023-05-02 LAB — BAYER DCA HB A1C WAIVED: HB A1C (BAYER DCA - WAIVED): 6.2 % — ABNORMAL HIGH (ref 4.8–5.6)

## 2023-05-02 MED ORDER — MECLIZINE HCL 25 MG PO TABS: 25.0000 mg | ORAL_TABLET | Freq: Three times a day (TID) | ORAL | 1 refills | Status: AC | PRN

## 2023-05-03 LAB — CMP14+EGFR
ALT: 21 IU/L (ref 0–32)
AST: 18 IU/L (ref 0–40)
Albumin: 3.8 g/dL — ABNORMAL LOW (ref 3.9–4.9)
Alkaline Phosphatase: 103 IU/L (ref 44–121)
BUN/Creatinine Ratio: 16 (ref 12–28)
BUN: 10 mg/dL (ref 8–27)
Bilirubin Total: 0.3 mg/dL (ref 0.0–1.2)
CO2: 25 mmol/L (ref 20–29)
Calcium: 9.2 mg/dL (ref 8.7–10.3)
Chloride: 100 mmol/L (ref 96–106)
Creatinine, Ser: 0.63 mg/dL (ref 0.57–1.00)
Globulin, Total: 2.8 g/dL (ref 1.5–4.5)
Glucose: 74 mg/dL (ref 70–99)
Potassium: 4.5 mmol/L (ref 3.5–5.2)
Sodium: 140 mmol/L (ref 134–144)
Total Protein: 6.6 g/dL (ref 6.0–8.5)
eGFR: 99 mL/min/{1.73_m2} (ref >59)

## 2023-05-03 LAB — CBC WITH DIFFERENTIAL/PLATELET
Basophils Absolute: 0 10*3/uL (ref 0.0–0.2)
Basos: 1 %
EOS (ABSOLUTE): 0.1 10*3/uL (ref 0.0–0.4)
Eos: 2 %
Hematocrit: 36.3 % (ref 34.0–46.6)
Hemoglobin: 12.1 g/dL (ref 11.1–15.9)
Immature Grans (Abs): 0.1 10*3/uL (ref 0.0–0.1)
Immature Granulocytes: 1 %
Lymphocytes Absolute: 1.9 10*3/uL (ref 0.7–3.1)
Lymphs: 34 %
MCH: 28.7 pg (ref 26.6–33.0)
MCHC: 33.3 g/dL (ref 31.5–35.7)
MCV: 86 fL (ref 79–97)
Monocytes Absolute: 0.3 10*3/uL (ref 0.1–0.9)
Monocytes: 6 %
Neutrophils Absolute: 3.1 10*3/uL (ref 1.4–7.0)
Neutrophils: 56 %
Platelets: 373 10*3/uL (ref 150–450)
RBC: 4.21 x10E6/uL (ref 3.77–5.28)
RDW: 13.8 % (ref 11.7–15.4)
WBC: 5.5 10*3/uL (ref 3.4–10.8)

## 2023-05-04 ENCOUNTER — Ambulatory Visit: Payer: Medicaid Other

## 2023-05-04 ENCOUNTER — Encounter: Payer: Self-pay | Admitting: Family Medicine

## 2023-05-04 DIAGNOSIS — R6889 Other general symptoms and signs: Secondary | ICD-10-CM

## 2023-05-04 DIAGNOSIS — J029 Acute pharyngitis, unspecified: Secondary | ICD-10-CM

## 2023-05-04 LAB — VERITOR FLU A/B WAIVED
Influenza A: NEGATIVE
Influenza B: NEGATIVE

## 2023-05-04 LAB — RAPID STREP SCREEN (MED CTR MEBANE ONLY): Strep Gp A Ag, IA W/Reflex: NEGATIVE

## 2023-05-04 LAB — RSV AG, IMMUNOCHR, WAIVED: RSV Ag, Immunochr, Waived: NEGATIVE

## 2023-05-04 LAB — CULTURE, GROUP A STREP

## 2023-05-04 NOTE — Patient Instructions (Signed)
It appears that you have a viral upper respiratory infection (cold).  Cold symptoms can last up to 2 weeks.   ? ?- Get plenty of rest and drink plenty of fluids. ?- Try to breathe moist air. Use a cold mist humidifier. ?- Consume warm fluids (soup or tea) to provide relief for a stuffy nose and to loosen phlegm. ?- For nasal stuffiness, try saline nasal spray or a Neti Pot. Afrin nasal spray can also be used but this product should not be used longer than 3 days or it will cause rebound nasal stuffiness (worsening nasal congestion). ?- For sore throat pain relief: use chloraseptic spray, suck on throat lozenges, hard candy or popsicles; gargle with warm salt water (1/4 tsp. salt per 8 oz. of water); and eat soft, bland foods. ?- Eat a well-balanced diet. If you cannot, ensure you are getting enough nutrients by taking a daily multivitamin. ?- Avoid dairy products, as they can thicken phlegm. ?- Avoid alcohol, as it impairs your body?s immune system. ? ?CONTACT YOUR DOCTOR IF YOU EXPERIENCE ANY OF THE FOLLOWING: ?- High fever ?- Ear pain ?- Sinus-type headache ?- Unusually severe cold symptoms ?- Cough that gets worse while other cold symptoms improve ?- Flare up of any chronic lung problem, such as asthma ?- Your symptoms persist longer than 2 weeks ? ? ?

## 2023-05-04 NOTE — Progress Notes (Signed)
 Subjective:  Patient ID: Maria Winters, female    DOB: September 21, 1960, 63 y.o.   MRN: [id number]  Patient Care Team: Junie Spencer, FNP as PCP - General (Family Medicine) Michaelle Copas, MD as Consulting Physician (Optometry) Marvel Plan, MD as Consulting Physician (Neurology) Malissa Hippo, MD (Inactive) as Consulting Physician (Gastroenterology) Janalyn Harder, MD (Inactive) as Consulting Physician (Dermatology)   Chief Complaint:  URI  HPI: Maria Winters is a 63 y.o. female presenting on 05/04/2023 for URI  HPI 1. Flu-like symptoms States that symptoms started on Monday with sore throat, headaches, fatigue, intermittent cough and left ear ache. Has not tried anything over the counter, has theraflu at home. Denies N/V, diarrhea. Endorses chills and flushing.   Relevant past medical, surgical, family, and social history reviewed and updated as indicated.  Allergies and medications reviewed and updated. Data reviewed: Chart in Epic.   Past Medical History:  Diagnosis Date   Abnormal Pap smear of cervix    in her 20's-- hx of conization/LEEP procedure-normal paps since   Abnormal uterine bleeding    Anemia    ~2005   Arthritis    Atrial fibrillation (HCC)    Chest pain    Fibroid    GERD (gastroesophageal reflux disease)    Helicobacter pylori (H. pylori)    Psoriasis    Reflux    STD (sexually transmitted disease) 1994   Tx'd for Chlamydia   TMJ (temporomandibular joint syndrome)    Vision abnormalities    tunnel vision    Past Surgical History:  Procedure Laterality Date   cervix dilated     CHOLECYSTECTOMY N/A 09/13/2012   Procedure: LAPAROSCOPIC CHOLECYSTECTOMY WITH INTRAOPERATIVE CHOLANGIOGRAM;  Surgeon: Valarie Merino, MD;  Location: [facility];  Service: General;  Laterality: N/A;   DILATION AND CURETTAGE OF UTERUS     DILATION AND CURETTAGE, DIAGNOSTIC / THERAPEUTIC  1989   with Conization   ESOPHAGOGASTRODUODENOSCOPY N/A 05/11/2012   Procedure:  ESOPHAGOGASTRODUODENOSCOPY (EGD);  Surgeon: Malissa Hippo, MD;  Location: [facility];  Service: Endoscopy;  Laterality: N/A;  125   LEEP      Social History   Socioeconomic History   Marital status: Single    Spouse name: Not on file   Number of children: 0   Years of education: Not on file   Highest education level: Some college, no degree  Occupational History   Not on file  Tobacco Use   Smoking status: Former    Current packs/day: 0.00    Average packs/day: 0.3 packs/day for 10.0 years (2.5 ttl pk-yrs)    Types: Cigarettes    Start date: 06/21/1982    Quit date: 06/20/1992    Years since quitting: 30.8    Passive exposure: Never   Smokeless tobacco: Never  Vaping Use   Vaping status: Never Used  Substance and Sexual Activity   Alcohol use: Never   Drug use: No   Sexual activity: Never    Birth control/protection: Abstinence  Other Topics Concern   Not on file  Social History Narrative   Not on file   Social Drivers of Health   Financial Resource Strain: Low Risk  (05/03/2023)   Overall Financial Resource Strain (CARDIA)    Difficulty of Paying Living Expenses: Not hard at all  Food Insecurity: No Food Insecurity (05/03/2023)   Hunger Vital Sign    Worried About Running Out of Food in the Last Year: Never true  Ran Out of Food in the Last Year: Never true  Transportation Needs: No Transportation Needs (05/03/2023)   PRAPARE - Administrator, Civil Service (Medical): No    Lack of Transportation (Non-Medical): No  Physical Activity: Sufficiently Active (05/03/2023)   Exercise Vital Sign    Days of Exercise per Week: 6 days    Minutes of Exercise per Session: 30 min  Stress: No Stress Concern Present (05/03/2023)   [employer] of Occupational Health - Occupational Stress Questionnaire    Feeling of Stress : Not at all  Social Connections: Moderately Isolated (05/03/2023)   Social Connection and Isolation Panel [NHANES]    Frequency of  Communication with Friends and Family: Once a week    Frequency of Social Gatherings with Friends and Family: More than three times a week    Attends Religious Services: More than 4 times per year    Active Member of [employer] or Organizations: No    Attends Banker Meetings: Not on file    Marital Status: Never married  Intimate Partner Violence: Not on file    Outpatient Encounter Medications as of 05/04/2023  Medication Sig   acetaminophen (TYLENOL) 325 MG tablet Take 650 mg by mouth every 6 (six) hours as needed.   apixaban (ELIQUIS) 5 MG TABS tablet TAKE 1 TABLET BY MOUTH TWICE A DAY   Cholecalciferol (VITAMIN D) 125 MCG (5000 UT) CAPS Take by mouth.   dexamethasone 0.5 MG/5ML elixir Take 0.5 mg by mouth 4 (four) times daily.   fluocinonide gel (LIDEX) 0.05 % Apply topically 4 (four) times daily.   folic acid (FOLVITE) 1 MG tablet Take 1 tablet (1 mg total) by mouth daily.   meclizine (ANTIVERT) 25 MG tablet Take 1 tablet (25 mg total) by mouth 3 (three) times daily as needed for dizziness.   methotrexate (RHEUMATREX) 2.5 MG tablet Take 10 tablets (25 mg total) by mouth once a week. Caution:Chemotherapy. Protect from light.   metoprolol succinate (TOPROL-XL) 25 MG 24 hr tablet Take 1 tablet (25 mg total) by mouth daily.   polyethylene glycol (MIRALAX / GLYCOLAX) 17 g packet Take 17 g by mouth daily.   RESTASIS 0.05 % ophthalmic emulsion 1 drop 2 (two) times daily.   Semaglutide,0.25 or 0.5MG /DOS, 2 MG/3ML SOPN Inject 0.25 mg into the skin once a week.   No facility-administered encounter medications on file as of 05/04/2023.    Allergies  Allergen Reactions   Esomeprazole Magnesium     Patient states that the Nexium caused tremors   Review of Systems As per HPI  Objective:  BP 139/73   Pulse 94   Temp 98.2 F (36.8 C)   Ht 5' 5.5" (1.664 m)   Wt 255 lb (115.7 kg)   LMP 03/06/2021 (Exact Date)   SpO2 98%   BMI 41.79 kg/m    Wt Readings from Last 3 Encounters:   05/04/23 255 lb (115.7 kg)  02/01/23 245 lb (111.1 kg)  11/30/22 239 lb 12.8 oz (108.8 kg)    Physical Exam Constitutional:      General: She is awake. She is not in acute distress.    Appearance: Normal appearance. She is well-developed and well-groomed. She is obese. She is not ill-appearing, toxic-appearing or diaphoretic.  HENT:     Right Ear: No drainage, swelling or tenderness. A middle ear effusion is present. There is no impacted cerumen. No foreign body. No mastoid tenderness. No PE tube. No hemotympanum. Tympanic membrane is not  injected, scarred, perforated, erythematous, retracted or bulging.     Left Ear: No drainage, swelling or tenderness. A middle ear effusion is present. There is no impacted cerumen. No foreign body. No mastoid tenderness. No PE tube. No hemotympanum. Tympanic membrane is not injected, scarred, perforated, erythematous, retracted or bulging.     Nose: Congestion and rhinorrhea present. Rhinorrhea is clear.     Right Sinus: No maxillary sinus tenderness or frontal sinus tenderness.     Left Sinus: No maxillary sinus tenderness or frontal sinus tenderness.     Mouth/Throat:     Lips: Pink. No lesions.     Mouth: Mucous membranes are moist.     Tongue: No lesions. Tongue does not deviate from midline.     Palate: No mass and lesions.     Pharynx: Posterior oropharyngeal erythema present. No pharyngeal swelling, oropharyngeal exudate, uvula swelling or postnasal drip.     Tonsils: No tonsillar exudate or tonsillar abscesses. 2+ on the right. 2+ on the left.  Cardiovascular:     Rate and Rhythm: Normal rate and regular rhythm.     Pulses: Normal pulses.          Radial pulses are 2+ on the right side and 2+ on the left side.       Posterior tibial pulses are 2+ on the right side and 2+ on the left side.     Heart sounds: Normal heart sounds. No murmur heard.    No gallop.  Pulmonary:     Effort: Pulmonary effort is normal. No respiratory distress.      Breath sounds: Normal breath sounds. No stridor. No wheezing, rhonchi or rales.  Musculoskeletal:     Cervical back: Full passive range of motion without pain and neck supple.     Right lower leg: No edema.     Left lower leg: No edema.  Skin:    General: Skin is warm.     Capillary Refill: Capillary refill takes less than 2 seconds.  Neurological:     General: No focal deficit present.     Mental Status: She is alert, oriented to person, place, and time and easily aroused. Mental status is at baseline.     GCS: GCS eye subscore is 4. GCS verbal subscore is 5. GCS motor subscore is 6.     Motor: No weakness.  Psychiatric:        Attention and Perception: Attention and perception normal.        Mood and Affect: Mood and affect normal.        Speech: Speech normal.        Behavior: Behavior normal. Behavior is cooperative.        Thought Content: Thought content normal. Thought content does not include homicidal or suicidal ideation. Thought content does not include homicidal or suicidal plan.        Cognition and Memory: Cognition and memory normal.        Judgment: Judgment normal.     Results for orders placed or performed in visit on 05/02/23  Bayer DCA Hb A1c Waived   Collection Time: 05/02/23 12:39 PM  Result Value Ref Range   HB A1C (BAYER DCA - WAIVED) 6.2 (H) 4.8 - 5.6 %  CMP14+EGFR   Collection Time: 05/02/23 12:41 PM  Result Value Ref Range   Glucose 74 70 - 99 mg/dL   BUN 10 8 - 27 mg/dL   Creatinine, Ser 1.91 0.57 - 1.00 mg/dL   eGFR  99 >59 mL/min/1.73   BUN/Creatinine Ratio 16 12 - 28   Sodium 140 134 - 144 mmol/L   Potassium 4.5 3.5 - 5.2 mmol/L   Chloride 100 96 - 106 mmol/L   CO2 25 20 - 29 mmol/L   Calcium 9.2 8.7 - 10.3 mg/dL   Total Protein 6.6 6.0 - 8.5 g/dL   Albumin 3.8 (L) 3.9 - 4.9 g/dL   Globulin, Total 2.8 1.5 - 4.5 g/dL   Bilirubin Total 0.3 0.0 - 1.2 mg/dL   Alkaline Phosphatase 103 44 - 121 IU/L   AST 18 0 - 40 IU/L   ALT 21 0 - 32 IU/L   CBC with Differential/Platelet   Collection Time: 05/02/23 12:41 PM  Result Value Ref Range   WBC 5.5 3.4 - 10.8 x10E3/uL   RBC 4.21 3.77 - 5.28 x10E6/uL   Hemoglobin 12.1 11.1 - 15.9 g/dL   Hematocrit 16.1 09.6 - 46.6 %   MCV 86 79 - 97 fL   MCH 28.7 26.6 - 33.0 pg   MCHC 33.3 31.5 - 35.7 g/dL   RDW 04.5 40.9 - 81.1 %   Platelets 373 150 - 450 x10E3/uL   Neutrophils 56 Not Estab. %   Lymphs 34 Not Estab. %   Monocytes 6 Not Estab. %   Eos 2 Not Estab. %   Basos 1 Not Estab. %   Neutrophils Absolute 3.1 1.4 - 7.0 x10E3/uL   Lymphocytes Absolute 1.9 0.7 - 3.1 x10E3/uL   Monocytes Absolute 0.3 0.1 - 0.9 x10E3/uL   EOS (ABSOLUTE) 0.1 0.0 - 0.4 x10E3/uL   Basophils Absolute 0.0 0.0 - 0.2 x10E3/uL   Immature Granulocytes 1 Not Estab. %   Immature Grans (Abs) 0.1 0.0 - 0.1 x10E3/uL       05/04/2023   11:10 AM 11/30/2022   10:54 AM 06/03/2022    2:41 PM 08/28/2021   12:23 PM 04/02/2021    2:57 PM  Depression screen PHQ 2/9  Decreased Interest 0 2 0 0 2  Down, Depressed, Hopeless 0 2 0 0 0  PHQ - 2 Score 0 4 0 0 2  Altered sleeping 1 2  1  0  Tired, decreased energy 2 2  1  0  Change in appetite 0 3  2 0  Feeling bad or failure about yourself  0 0  0 0  Trouble concentrating 0 0  1 0  Moving slowly or fidgety/restless 0 0  0 0  Suicidal thoughts 0 0  0 0  PHQ-9 Score 3 11  5 2   Difficult doing work/chores  Not difficult at all  Not difficult at all        05/04/2023   11:11 AM 11/30/2022   10:53 AM 08/28/2021   12:23 PM 04/02/2021    2:58 PM  GAD 7 : Generalized Anxiety Score  Nervous, Anxious, on Edge 0 0 0 0  Control/stop worrying 0 0 0 0  Worry too much - different things 0 0 0   Trouble relaxing 0 0 0 0  Restless 0 1 0 3  Easily annoyed or irritable 0 0 1 0  Afraid - awful might happen 0 0 0 0  Total GAD 7 Score 0 1 1   Anxiety Difficulty  Not difficult at all Not difficult at all    Pertinent labs & imaging results that were available during my care of the patient  were reviewed by me and considered in my medical decision making.  Assessment & Plan:  Diagnoses and all orders for this visit:  Flu-like symptoms Negative for flu, rsv, strep on rapid swab. Discussed with patient that likely viral in etiology. Discussed that symptoms can last for up to 2 weeks. Encouraged patient to follow up if they have worsening symptoms or signs of double worsening. Discussed at home care such as humidifier, saline spray, throat lozenges, increasing hydration, and tylenol for pain or fever.  -     RSV Ag, Immunochr, Waived -     Veritor Flu A/B Waived -     COVID-19, Flu A+B and RSV  Sore throat As above.  -     Rapid Strep Screen ([facility] ONLY); Future -     Culture, Group A Strep; Future -     Rapid Strep Screen ([facility] ONLY) -     Culture, Group A Strep  Continue all other maintenance medications.  Follow up plan: Return if symptoms worsen or fail to improve.   Continue healthy lifestyle choices, including diet (rich in fruits, vegetables, and lean proteins, and low in salt and simple carbohydrates) and exercise (at least 30 minutes of moderate physical activity daily).  Written and verbal instructions provided   The above assessment and management plan was discussed with the patient. The patient verbalized understanding of and has agreed to the management plan. Patient is aware to call the clinic if they develop any new symptoms or if symptoms persist or worsen. Patient is aware when to return to the clinic for a follow-up visit. Patient educated on when it is appropriate to go to the emergency department.   Neale Burly, DNP-FNP [facility] [address] [place], [place] [address] [phone]

## 2023-05-05 ENCOUNTER — Ambulatory Visit: Payer: Medicaid Other | Admitting: Internal Medicine

## 2023-05-05 DIAGNOSIS — Z79899 Other long term (current) drug therapy: Secondary | ICD-10-CM

## 2023-05-05 DIAGNOSIS — M059 Rheumatoid arthritis with rheumatoid factor, unspecified: Secondary | ICD-10-CM

## 2023-05-05 LAB — COVID-19, FLU A+B AND RSV
Influenza A, NAA: NOT DETECTED
Influenza B, NAA: NOT DETECTED
RSV, NAA: NOT DETECTED
SARS-CoV-2, NAA: NOT DETECTED

## 2023-05-06 ENCOUNTER — Encounter: Payer: Self-pay | Admitting: Family Medicine

## 2023-05-06 LAB — CULTURE, GROUP A STREP: Strep A Culture: NEGATIVE

## 2023-05-06 NOTE — Progress Notes (Signed)
 Negative for all tests. Follow up if symptoms continue or worsen.

## 2023-05-13 ENCOUNTER — Encounter: Payer: Self-pay | Admitting: Cardiology

## 2023-05-13 ENCOUNTER — Other Ambulatory Visit: Payer: Self-pay | Admitting: Family

## 2023-05-13 ENCOUNTER — Ambulatory Visit: Payer: Medicaid Other | Attending: Cardiology | Admitting: Cardiology

## 2023-05-13 VITALS — BP 132/74 | HR 67 | Resp 16 | Ht 65.0 in | Wt 253.2 lb

## 2023-05-13 DIAGNOSIS — I10 Essential (primary) hypertension: Secondary | ICD-10-CM | POA: Diagnosis not present

## 2023-05-13 DIAGNOSIS — I480 Paroxysmal atrial fibrillation: Secondary | ICD-10-CM

## 2023-05-13 DIAGNOSIS — E1165 Type 2 diabetes mellitus with hyperglycemia: Secondary | ICD-10-CM

## 2023-05-13 DIAGNOSIS — G4733 Obstructive sleep apnea (adult) (pediatric): Secondary | ICD-10-CM | POA: Diagnosis not present

## 2023-05-13 NOTE — Progress Notes (Signed)
 Cardiology Office Note:  .   Date:  05/13/2023  ID:  Maria Winters, DOB 11-27-1960, MRN [id number] PCP: Yates Decamp, MD  [facility] Providers Cardiologist:  None   History of Present Illness: .   Maria Winters is a 63 y.o. African-American obese female with history of diabetes, hypertension, hyperlipidemia, morbid obesity and OSA on CPAP and compliant, psoriasis of the skin and also seropositive rheumatoid arthritis being followed by rheumatology and presently on long-term methotrexate presents for 79-month office visit, diagnosed with PAF in April 2022 when she presented to the emergency room.   Discussed the use of AI scribe software for clinical note transcription with the patient, who gave verbal consent to proceed.  History of Present Illness   The patient, with a history of obesity and sleep apnea, presents for a routine follow-up. She reports ongoing use of her CPAP machine, but is awaiting new supplies. She has noticed intermittent cessation of the machine's sound and the presence of liquid in the tubing, which she believes may be causing chest discomfort.  Regarding her weight management, the patient had previously discontinued Ozempic due to nausea but has since resumed a lower dose in February. She reports some weight loss and improved fit of clothing, but has plateaued at 250 lbs. She expresses a desire to continue losing weight.  The patient also reports infrequent episodes of an unspecified condition, occurring approximately once every six months. She is maintaining a regular exercise regimen of walking 30 minutes a day, five days a week.       Labs   Lab Results  Component Value Date   CHOL 170 01/07/2022   HDL 92 01/07/2022   LDLCALC 63 01/07/2022   TRIG 79 01/07/2022   CHOLHDL 1.8 01/07/2022   Lab Results  Component Value Date   NA 140 05/02/2023   K 4.5 05/02/2023   CO2 25 05/02/2023   GLUCOSE 74 05/02/2023   BUN 10 05/02/2023   CREATININE 0.63  05/02/2023   CALCIUM 9.2 05/02/2023   EGFR 99 05/02/2023   GFRNONAA >60 07/02/2020      Latest Ref Rng & Units 05/02/2023   12:41 PM 02/01/2023    3:10 PM 11/01/2022    2:22 PM  BMP  Glucose 70 - 99 mg/dL 74  962  952   BUN 8 - 27 mg/dL 10  7  8    Creatinine 0.57 - 1.00 mg/dL 8.41  3.24  4.01   BUN/Creat Ratio 12 - 28 16  SEE NOTE:  SEE NOTE:   Sodium 134 - 144 mmol/L 140  140  139   Potassium 3.5 - 5.2 mmol/L 4.5  4.3  4.3   Chloride 96 - 106 mmol/L 100  102  104   CO2 20 - 29 mmol/L 25  32  30   Calcium 8.7 - 10.3 mg/dL 9.2  9.2  8.9       Latest Ref Rng & Units 05/02/2023   12:41 PM 02/01/2023    3:10 PM 11/01/2022    2:22 PM  CBC  WBC 3.4 - 10.8 x10E3/uL 5.5  7.0  6.1   Hemoglobin 11.1 - 15.9 g/dL 02.7  25.3  66.4   Hematocrit 34.0 - 46.6 % 36.3  35.2  33.9   Platelets 150 - 450 x10E3/uL 373  468  375    Lab Results  Component Value Date   HGBA1C 6.2 (H) 05/02/2023    Lab Results  Component Value Date   TSH  1.130 01/07/2022    Review of Systems  Cardiovascular:  Positive for palpitations (occasional). Negative for chest pain, dyspnea on exertion and leg swelling.   Physical Exam:   VS:  BP 132/74 (BP Location: Left Arm, Patient Position: Sitting, Cuff Size: Large)   Pulse 67   Resp 16   Ht 5\' 5"  (1.651 m)   Wt 253 lb 3.2 oz (114.9 kg)   LMP 03/06/2021 (Exact Date)   SpO2 95%   BMI 42.13 kg/m    Wt Readings from Last 3 Encounters:  05/13/23 253 lb 3.2 oz (114.9 kg)  05/04/23 255 lb (115.7 kg)  02/01/23 245 lb (111.1 kg)    Physical Exam Constitutional:      Appearance: She is obese.  Neck:     Vascular: No carotid bruit or JVD.  Cardiovascular:     Rate and Rhythm: Normal rate and regular rhythm.     Pulses: Intact distal pulses.     Heart sounds: Normal heart sounds. No murmur heard.    No gallop.  Pulmonary:     Effort: Pulmonary effort is normal.     Breath sounds: Normal breath sounds.  Abdominal:     General: Bowel sounds are normal.      Palpations: Abdomen is soft.  Musculoskeletal:     Right lower leg: No edema.     Left lower leg: No edema.    Studies Reviewed: .    Carotid artery duplex 06/08/2022:  No hemodynamically significant arterial disease in the internal carotid  artery bilaterally.  Antegrade right vertebral artery flow. Antegrade left vertebral artery  flow.  EKG:    EKG Interpretation Date/Time:  Friday May 13 2023 11:28:42 EST Ventricular Rate:  67 PR Interval:  160 QRS Duration:  80 QT Interval:  370 QTC Calculation: 390 R Axis:   31  Text Interpretation: EKG 05/13/2023: Normal sinus rhythm at the rate of 67 bpm, normal EKG. Confirmed by Delrae Rend [phone]) on 05/13/2023 11:39:17 AM    Medications and allergies    Allergies  Allergen Reactions   Esomeprazole Magnesium     Patient states that the Nexium caused tremors     Current Outpatient Medications:    acetaminophen (TYLENOL) 325 MG tablet, Take 650 mg by mouth every 6 (six) hours as needed., Disp: , Rfl:    apixaban (ELIQUIS) 5 MG TABS tablet, TAKE 1 TABLET BY MOUTH TWICE A DAY, Disp: 60 tablet, Rfl: 5   Cholecalciferol (VITAMIN D) 125 MCG (5000 UT) CAPS, Take by mouth., Disp: , Rfl:    dexamethasone 0.5 MG/5ML elixir, Take 0.5 mg by mouth 4 (four) times daily., Disp: , Rfl:    fluocinonide gel (LIDEX) 0.05 %, Apply topically 4 (four) times daily., Disp: , Rfl:    folic acid (FOLVITE) 1 MG tablet, Take 1 tablet (1 mg total) by mouth daily., Disp: 90 tablet, Rfl: 3   meclizine (ANTIVERT) 25 MG tablet, Take 1 tablet (25 mg total) by mouth 3 (three) times daily as needed for dizziness., Disp: 90 tablet, Rfl: 1   methotrexate (RHEUMATREX) 2.5 MG tablet, Take 10 tablets (25 mg total) by mouth once a week. Caution:Chemotherapy. Protect from light., Disp: 130 tablet, Rfl: 0   metoprolol succinate (TOPROL-XL) 25 MG 24 hr tablet, Take 1 tablet (25 mg total) by mouth daily., Disp: 90 tablet, Rfl: 3   polyethylene glycol (MIRALAX / GLYCOLAX)  17 g packet, Take 17 g by mouth daily., Disp: , Rfl:    RESTASIS 0.05 %  ophthalmic emulsion, 1 drop 2 (two) times daily., Disp: , Rfl:    Semaglutide,0.25 or 0.5MG /DOS, 2 MG/3ML SOPN, Inject 0.25 mg into the skin once a week., Disp: 3 mL, Rfl: 2   ASSESSMENT AND PLAN: .      ICD-10-CM   1. Paroxysmal atrial fibrillation (HCC)  I48.0 EKG 12-Lead    2. Primary hypertension  I10       1. Paroxysmal atrial fibrillation (HCC) Patient is maintaining sinus rhythm, present on Eliquis appropriately in view of high chads vascular score of 3 or greater in view of female, hypertension, diabetes  2. Primary hypertension Blood pressure is well-controlled on present medical regimen including metoprolol succinate 25 mg daily, would consider addition of an ACE inhibitor or ARB in view of diabetic status.  Since being on Ozempic, she has lost some weight, A1c is now <6.5%.  Advised her to increase the dose of the Ozempic to higher dose improve weight loss.  Otherwise she remained stable from a cardiac standpoint, I will see her back on a as needed basis.  Obstructive Sleep Apnea   She continues CPAP therapy but experiences noise and discomfort, likely due to excess humidity causing liquid in the tubing. This affects sleep quality and causes chest discomfort. Adjust CPAP humidity settings to auto or lower to reduce excess water in the tubing.  Obesity   She is on Ozempic (semaglutide) for weight management. After experiencing nausea on a higher dose, she reduced to 0.25 mg, resulting in weight loss from 259 lbs to 250 lbs. Increase the Ozempic dose to enhance weight loss, as benefits outweigh potential nausea. Advise dietary modifications: eat slowly, less, and avoid high-sugar fruits like ripe bananas and grapes. Encourage continuation of physical activity, such as walking 30 minutes a day, five days a week.           Signed,  Yates Decamp, MD, [facility] 05/13/2023, 11:49 AM [facility] [address] [place], [place] [address] Phone: [phone]. Fax:  [phone]

## 2023-05-13 NOTE — Patient Instructions (Signed)
 Medication Instructions:  Your physician recommends that you continue on your current medications as directed. Please refer to the Current Medication list given to you today. *If you need a refill on your cardiac medications before your next appointment, please call your pharmacy*   Follow-Up: At [facility], you and your health needs are our priority.  As part of our continuing mission to provide you with exceptional heart care, we have created designated Provider Care Teams.  These Care Teams include your primary Cardiologist (physician) and Advanced Practice Providers (APPs -  Physician Assistants and Nurse Practitioners) who all work together to provide you with the care you need, when you need it.  We recommend signing up for the patient portal called "MyChart".  Sign up information is provided on this After Visit Summary.  MyChart is used to connect with patients for Virtual Visits (Telemedicine).  Patients are able to view lab/test results, encounter notes, upcoming appointments, etc.  Non-urgent messages can be sent to your provider as well.   To learn more about what you can do with MyChart, go to [email or web address].    Your next appointment:   1 year(s)  Provider:   Dr Jacinto Halim

## 2023-05-24 ENCOUNTER — Other Ambulatory Visit: Payer: Self-pay | Admitting: Internal Medicine

## 2023-05-24 DIAGNOSIS — M059 Rheumatoid arthritis with rheumatoid factor, unspecified: Secondary | ICD-10-CM

## 2023-05-24 NOTE — Telephone Encounter (Signed)
 Last Fill: 02/01/2023  Labs: 05/02/2023 Albumin 3.8,   Next Visit: 06/22/2023  Last Visit: 02/01/2023  DX: Seropositive rheumatoid arthritis   Current Dose per office note 02/01/2023: methotrexate 25 mg p.o. weekly   Okay to refill Methotrexate?

## 2023-05-27 ENCOUNTER — Other Ambulatory Visit (HOSPITAL_COMMUNITY): Payer: Self-pay

## 2023-06-09 NOTE — Progress Notes (Deleted)
 Office Visit Note  Patient: Maria Winters             Date of Birth: 09/11/1960           MRN: [id number]             PCP: Yates Decamp, MD Referring: Junie Spencer, FNP Visit Date: 06/22/2023   Subjective:  No chief complaint on file.   History of Present Illness: Maria Winters is a 63 y.o. female here for follow up for seropositive RA on methotrexate increased to 25 mg p.o. weekly since last visit and on folic acid 1 mg daily.    Previous HPI 02/01/2023 Maria Winters is a 63 y.o. female here for follow up for seropositive RA on methotrexate increased to 25 mg p.o. weekly since last visit and on folic acid 1 mg daily.  Overall symptoms have been doing worse recently.  Especially has been experiencing increased pain in her hips and at her ankles in the past few weeks.  This is bad enough that she cannot bear weight on her left foot a couple occasions during the past 4 days.  She is also noticed pain and swelling in the fingers usually affecting 2 or 3 fingers at a time.  She did not notice any change in activity no new illness or injury.  The cold weather does seem to at least somewhat aggravate symptoms.  She has noticed some generalized hair thinning wonders if this is related to the medication.   Previous HPI 11/01/2022 Maria Winters is a 63 y.o. female here for follow up for seropositive RA on methotrexate increased to 25 mg p.o. weekly since last visit and on folic acid 1 mg daily.  Since her last visit she had a few flareups of increased symptoms with bilateral hand shoulder and feet pain.  Also associated with episode of TMJ and these were acting up worse in June or early July she thinks associated with weather changes.  Since then come back down currently does not have any prolonged morning stiffness or joint swelling.  Last follow-up with her eye doctor they reported there was some inflammation thought to be related with her rheumatoid arthritis.  She was prescribed  Restasis for this to use in addition to artificial tears.   Previous HPI 07/28/2022 Maria Winters is a 63 y.o. female here for follow up for seropositive RA on methotrexate increased to 25 mg p.o. weekly since last visit and on folic acid 1 mg daily.  She had several events since her last visit she officially retired and so had a transition in insurance coverage which delayed her scheduling this follow-up appointment.  She was sick with upper respiratory infection symptoms and had a positive COVID test in February took molnupiravir for this.  She is now on semaglutide injection and has worked on cutting out sweets and caffeine from her diet as well as consistent daily exercise and nighttime use of CPAP.  Overall she feels very well with less fatigue and inflammation than she has had in the past several years.   Previous HPI 03/31/22 Maria Winters is a 63 y.o. female here for follow up of rheumatoid arthritis on methotrexate 20 mg p.o. weekly and folic acid 1 mg daily.  She has increased problems since a flareup started in her right foot on January 9.  She experiences migratory joint inflammation mostly in 1 joint at a time starting in the right foot proceeded to eventually involve  both feet both hands and both shoulders each spot lasting for about 2 or 3 days at a time.  Currently symptoms are improved again without any swollen area.  She did not recall any kind of illness injury or change in activity prior to this episode.  She has not been taking any specific medication for but is the spending all weekend resting and elevating her feet but symptoms worsening and during the week while having to be more active.   Previous HPI 01/25/22 Maria Winters is a 63 y.o. female here for follow up for seropositive RA  on MTX 15 mg PO weekly and folic acid 1 mg daily.  She has been mostly doing well since her last visit without significant daily peripheral joint pains or swelling.  Does get pain in her  knees somewhat limiting for prolonged walking standing and low back pains in a fixed seated position.  She is noticing pain in her feet on both sides most often after driving.  Had some episodic increase in pain at her hands and elbows improving on their own within about 2 weeks.  Also recently went to the emergency department a few weeks ago due to onset of left-sided jaw pain.  Evaluation was reassuring against cardiac etiology thought to be temporomandibular joint pain.  She completed a steroid taper and low-dose hydrocodone symptoms have resolved.   Previous HPI 10/20/2021 Maria Winters is a 63 y.o. female here for follow up for seropositive RA on methotrexate 15 mg p.o. weekly.  She seen a sizable improvement in much of her joint symptoms and has not experienced any trouble taking the medication.  She is having some pain in the right hip most often occurred when sitting in 1 position for prolonged time usually about 30 minutes or longer provokes this.  Otherwise joint problems are mostly doing better and she is not seeing any ongoing swelling changes.   Previous HPI  06/22/2021 Maria Winters is a 63 y.o. female here for follow up for seropositive RA after starting methotrexate 15 mg PO weekly. Also increase vitamin D supplementation to 5000 units daily. She misunderstood instructions so started taking methotrexate only 1 tablet weekly for 2 weeks then increased to 6 tablets as planned for the past 2 weeks. She has some diarrhea with the medication but not severe or lasting continuously. She had increased in bilateral heel pain worst when getting up to walk initially, the left side has improved but right side ongoing symptoms. Also now for about 3 days has pain in her left side around the flank or middle and low back. This radiates around to the front. No dysuria, frequency, or hematuria reported. She does not recall any injury. No history of kidney stones or kidney infections.   Previous  HPI 05/07/21 Maria Winters is a 63 y.o. female here for follow up for new seropositive RA labs at initial visit negative screening for hepatitis and TB her CCP is highly positive and vitamin D low at 15. Xrays showed mild osteoarthritis in the feet otherwise unremarkable. She continues having significant joint pain symptoms affected hands and feet and fatigue. She notices more difficulty performing tasks at work such as pulling drawer handles or turning door knobs with pain or poor grip strength.   Previous HPI 04/21/21 Maria Winters is a 63 y.o. female here for evaluation of joint pain in multiple areas with elevated RF and sed rate. She reports joint pain starting since about a month ago.  Pain is affecting many areas including shoulders, elbows, wrists, fingers, hips, knees, and ankles. She has some night time awakenings due to hand pain. Decreased grip strength is affecting her daily activities including typing and driving. Swelling is most apparent in her hands and on the top of her feet around the base of the toes. She is stiff for about 3 hours each morning before getting some improvement in her mobility. She is not able to take NSAIDs due to anticoagulation for Afib she takes tylenol which helps but has to repeat this about every 8 hours for symptoms returning.   Labs reviewed 03/2021 RF 494 ESR 70   No Rheumatology ROS completed.   PMFS History:  Patient Active Problem List   Diagnosis Date Noted   Eye inflammation 11/01/2022   OSA (obstructive sleep apnea) 11/27/2021   Morbid obesity (HCC) 05/11/2021   Paroxysmal atrial fibrillation (HCC) 05/11/2021   Intermittent palpitations 05/11/2021   Excessive daytime sleepiness 05/11/2021   At risk for central sleep apnea 05/11/2021   Seropositive rheumatoid arthritis (HCC) 04/21/2021   Vitamin D deficiency 04/21/2021   High risk medication use 04/21/2021   Myalgia 03/20/2021   Vertigo 07/23/2019   Constipation 07/23/2019   Anxiety  state 03/18/2016   Migraine equivalent 03/18/2016   Type 2 diabetes mellitus with other specified complication (HCC) 06/06/2015   S/P lap cholecystectomy July 2014 09/14/2012   GERD (gastroesophageal reflux disease) 04/28/2012    Past Medical History:  Diagnosis Date   Abnormal Pap smear of cervix    in her 20's-- hx of conization/LEEP procedure-normal paps since   Abnormal uterine bleeding    Anemia    ~2005   Arthritis    Atrial fibrillation (HCC)    Chest pain    Fibroid    GERD (gastroesophageal reflux disease)    Helicobacter pylori (H. pylori)    Psoriasis    Reflux    STD (sexually transmitted disease) 1994   Tx'd for Chlamydia   TMJ (temporomandibular joint syndrome)    Vision abnormalities    tunnel vision    Family History  Problem Relation Age of Onset   Diabetes Mother    Hypertension Father    Healthy Sister    Hypertension Sister    Hypertension Brother    Gout Brother    Heart attack Brother    Breast cancer Sister 32       lumpectomy   Sickle cell trait Brother    Diabetes Maternal Grandmother        with 2 BKA   Stroke Maternal Grandmother    Breast cancer Maternal Aunt    Past Surgical History:  Procedure Laterality Date   cervix dilated     CHOLECYSTECTOMY N/A 09/13/2012   Procedure: LAPAROSCOPIC CHOLECYSTECTOMY WITH INTRAOPERATIVE CHOLANGIOGRAM;  Surgeon: Valarie Merino, MD;  Location: [facility];  Service: General;  Laterality: N/A;   DILATION AND CURETTAGE OF UTERUS     DILATION AND CURETTAGE, DIAGNOSTIC / THERAPEUTIC  1989   with Conization   ESOPHAGOGASTRODUODENOSCOPY N/A 05/11/2012   Procedure: ESOPHAGOGASTRODUODENOSCOPY (EGD);  Surgeon: Malissa Hippo, MD;  Location: [facility];  Service: Endoscopy;  Laterality: N/A;  125   LEEP     Social History   Social History Narrative   Not on file   Immunization History  Administered Date(s) Administered   Influenza Split 04/19/2012   Influenza,inj,Quad PF,6+ Mos 01/24/2015, 12/23/2017,  12/07/2019, 03/20/2021, 01/07/2022   PFIZER(Purple Top)SARS-COV-2 Vaccination 06/16/2019, 07/07/2019, 01/26/2020   Pneumococcal  Conjugate-13 08/31/2016   Pneumococcal Polysaccharide-23 12/07/2019   Tdap 08/31/2016   Zoster Recombinant(Shingrix) 03/06/2019, 05/18/2019     Objective: Vital Signs: LMP 03/06/2021 (Exact Date)    Physical Exam   Musculoskeletal Exam: ***  CDAI Exam: CDAI Score: -- Patient Global: --; Provider Global: -- Swollen: --; Tender: -- Joint Exam 06/22/2023   No joint exam has been documented for this visit   There is currently no information documented on the homunculus. Go to the Rheumatology activity and complete the homunculus joint exam.  Investigation: No additional findings.  Imaging: No results found.  Recent Labs: Lab Results  Component Value Date   WBC 5.5 05/02/2023   HGB 12.1 05/02/2023   PLT 373 05/02/2023   NA 140 05/02/2023   K 4.5 05/02/2023   CL 100 05/02/2023   CO2 25 05/02/2023   GLUCOSE 74 05/02/2023   BUN 10 05/02/2023   CREATININE 0.63 05/02/2023   BILITOT 0.3 05/02/2023   ALKPHOS 103 05/02/2023   AST 18 05/02/2023   ALT 21 05/02/2023   PROT 6.6 05/02/2023   ALBUMIN 3.8 (L) 05/02/2023   CALCIUM 9.2 05/02/2023   GFRAA 83 08/24/2019   QFTBGOLDPLUS NEGATIVE 02/01/2023    Speciality Comments: Labs not drawn 5/22 due to currently transitioning insurance, plans to have collected at local [facility] once she has the card  Procedures:  No procedures performed Allergies: Esomeprazole magnesium   Assessment / Plan:     Visit Diagnoses: No diagnosis found.  ***  Orders: No orders of the defined types were placed in this encounter.  No orders of the defined types were placed in this encounter.    Follow-Up Instructions: No follow-ups on file.   Metta Clines, RT  Note - This record has been created using [employer].  Chart creation errors have been sought, but may not always  have been located. Such creation  errors do not reflect on  the standard of medical care.

## 2023-06-22 ENCOUNTER — Ambulatory Visit: Payer: Medicaid Other | Admitting: Internal Medicine

## 2023-06-22 DIAGNOSIS — M059 Rheumatoid arthritis with rheumatoid factor, unspecified: Secondary | ICD-10-CM

## 2023-06-22 DIAGNOSIS — Z79899 Other long term (current) drug therapy: Secondary | ICD-10-CM

## 2023-06-23 ENCOUNTER — Other Ambulatory Visit: Payer: Self-pay | Admitting: Internal Medicine

## 2023-06-23 DIAGNOSIS — M059 Rheumatoid arthritis with rheumatoid factor, unspecified: Secondary | ICD-10-CM

## 2023-06-23 NOTE — Telephone Encounter (Signed)
 Last Fill: 05/24/2023  Labs: 05/02/2023 Albumin 3.8  Next Visit: 08/11/2023  Last Visit: 02/01/2023  DX: Seropositive rheumatoid arthritis (HCC)   Current Dose per office note 02/01/2023: methotrexate 25 mg p.o. weekly   Okay to refill Methotrexate?

## 2023-06-30 IMAGING — CR DG CHEST 2V
2 series · 2 of 2 positions shown · non-contrast
Comparison: 07/02/2020

CLINICAL DATA: 60-year-old female with a history of rheumatoid
arthritis and no current complaints

EXAM:
CHEST - 2 VIEW

[w chest pa]
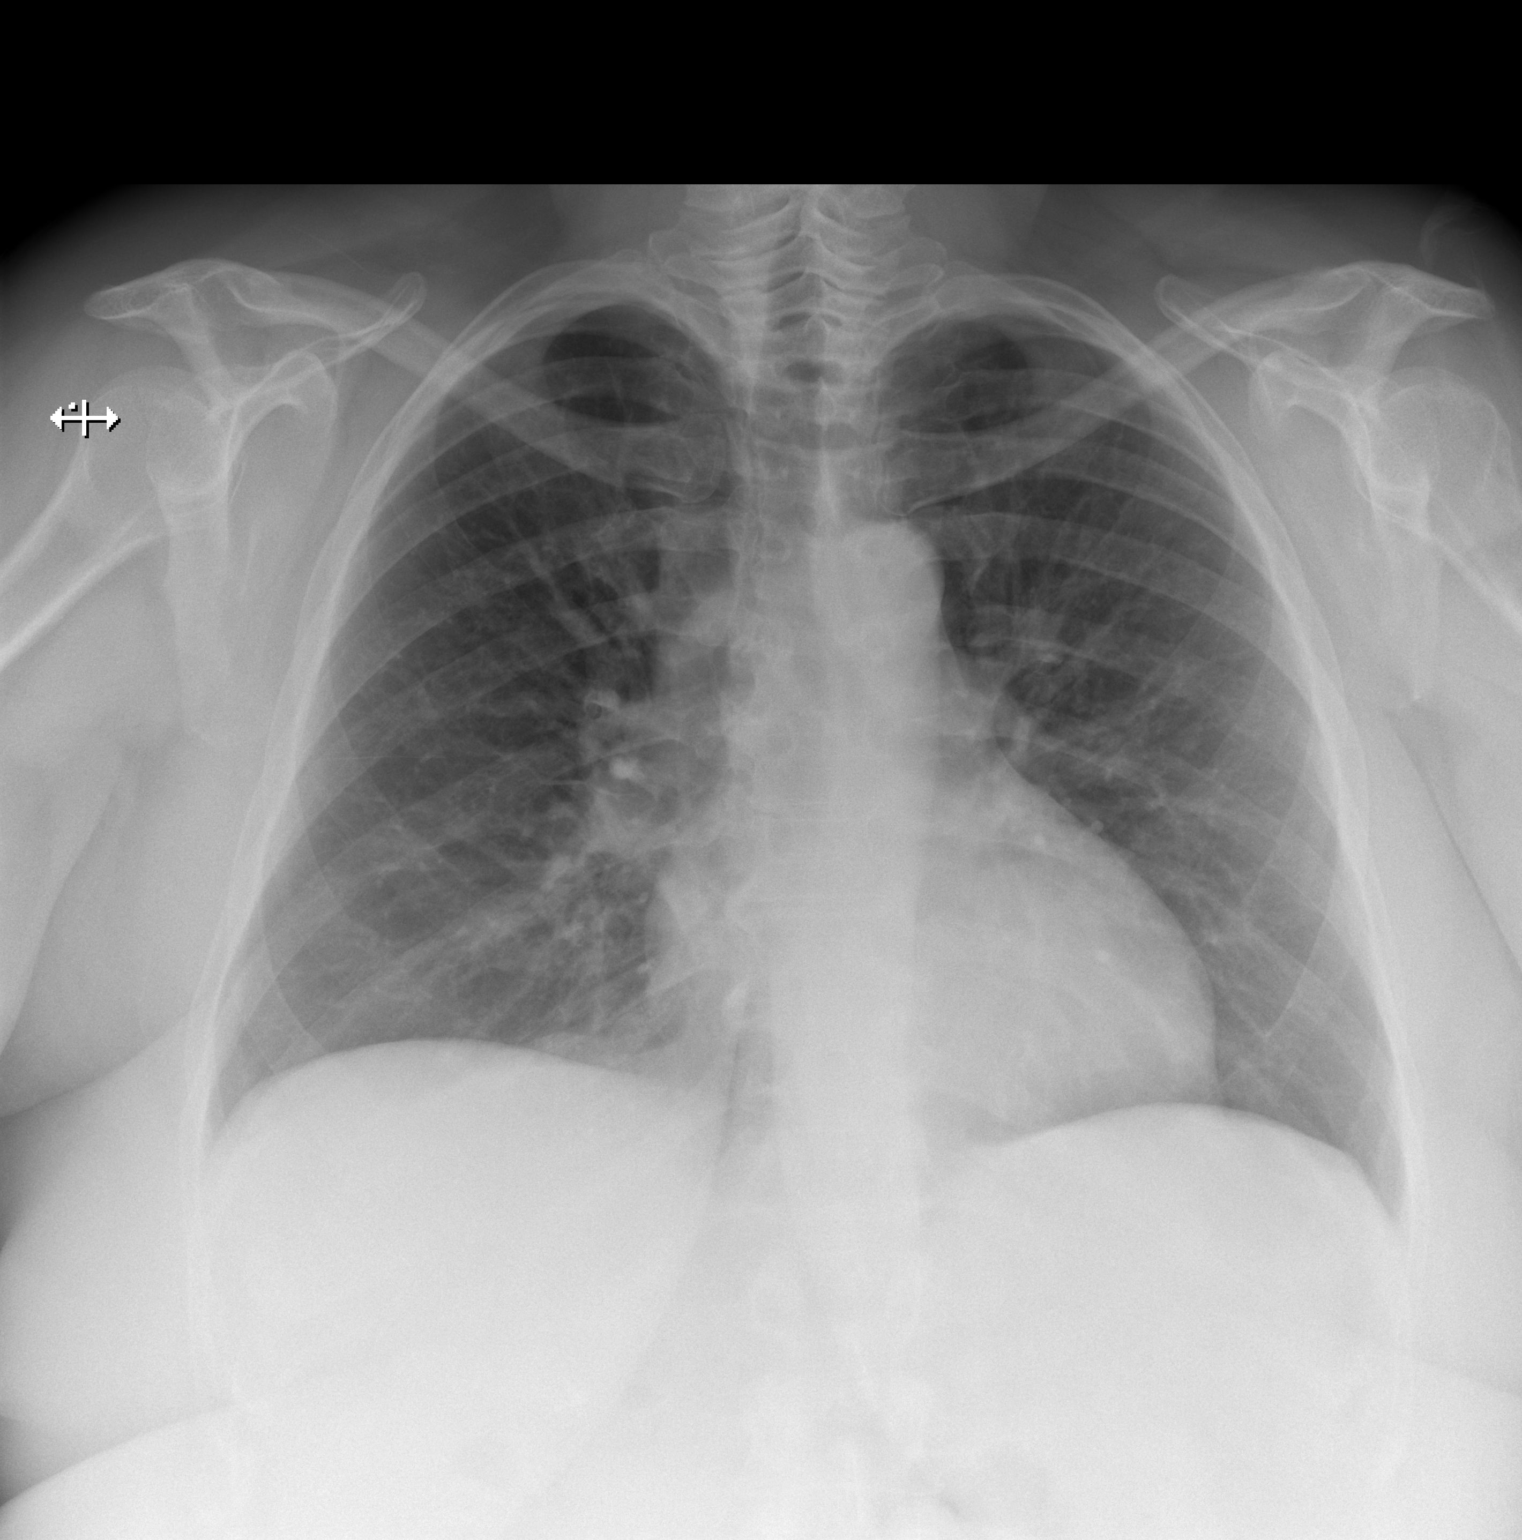

[w chest lat]
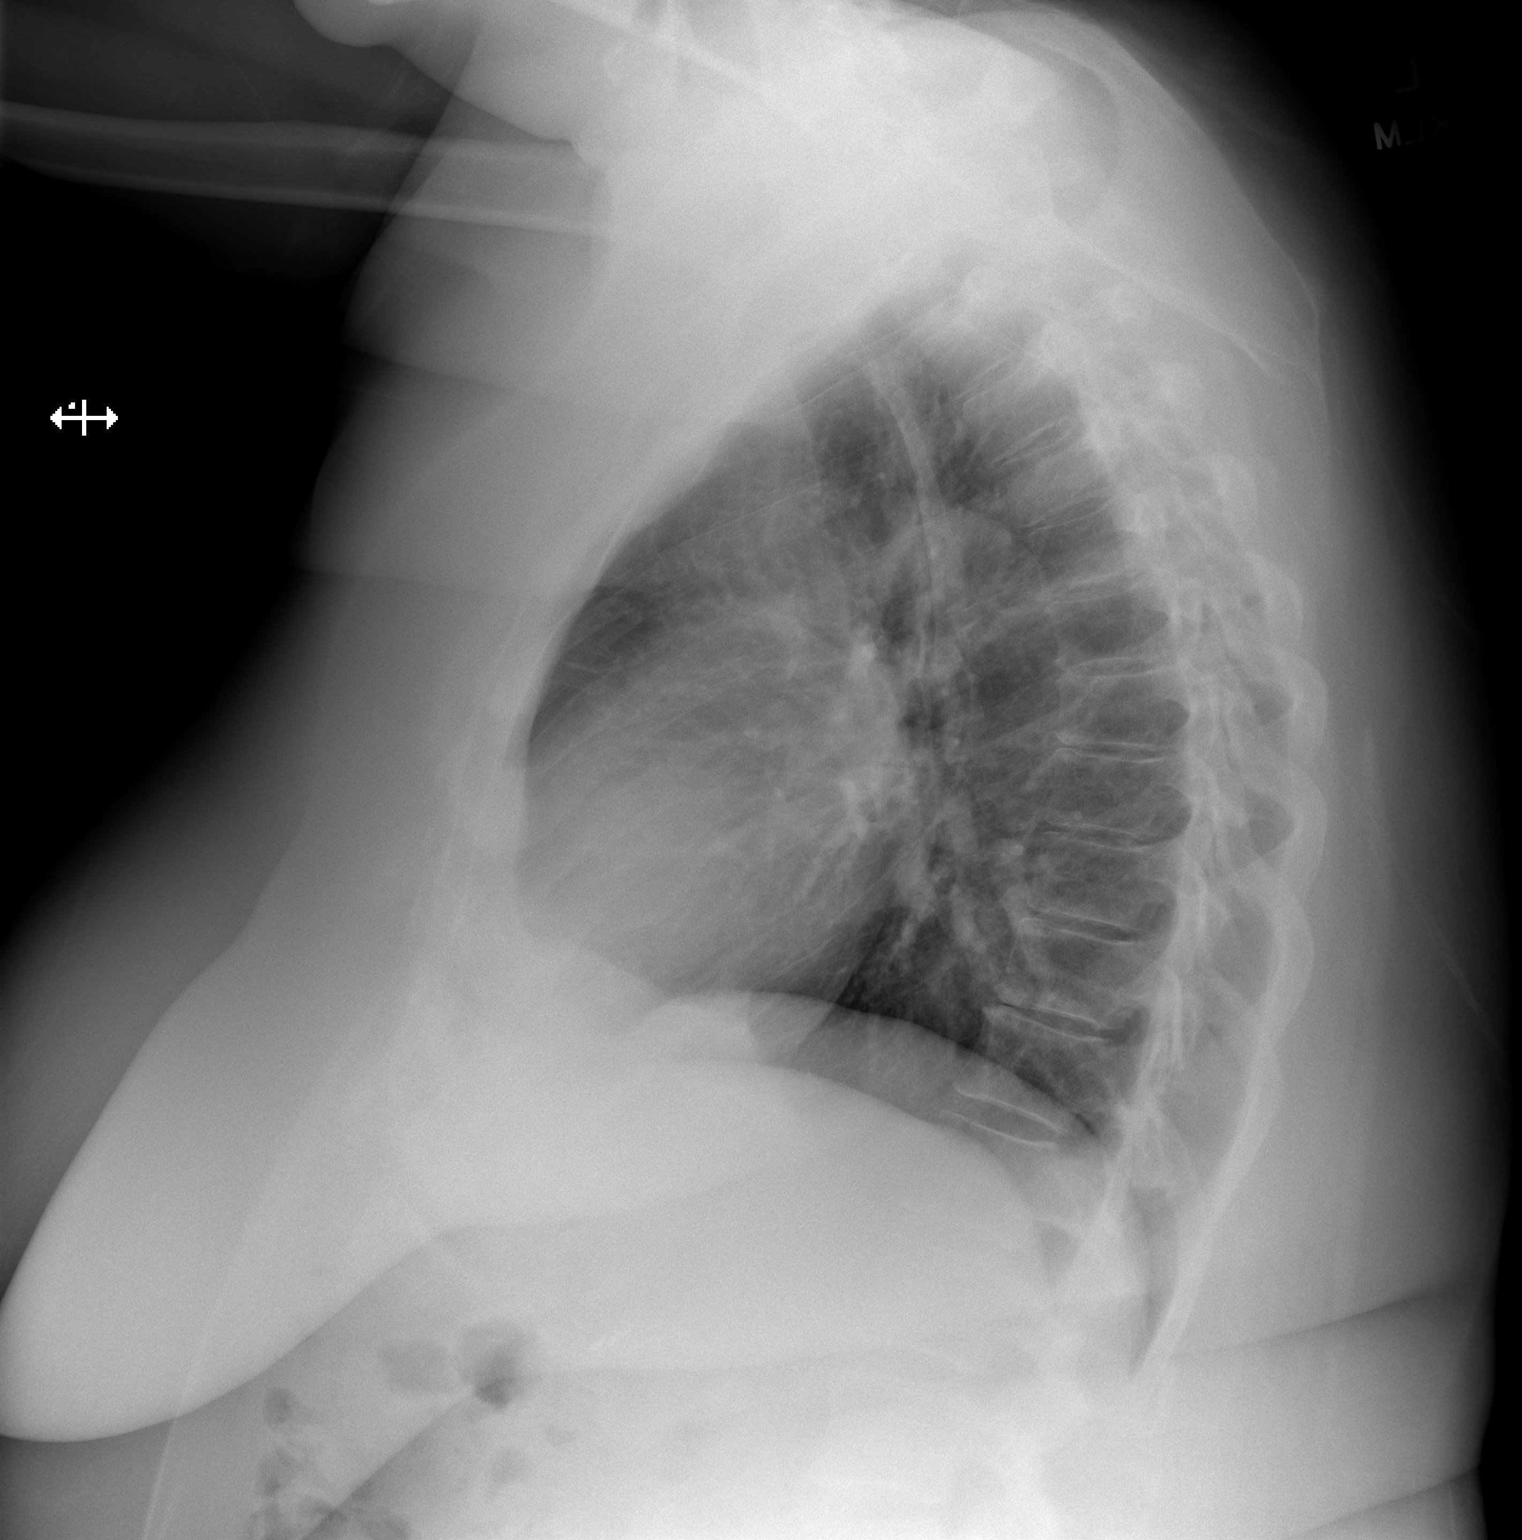

[2 of 2 positions shown; findings below may reference images not displayed]

FINDINGS: Cardiomediastinal silhouette unchanged in size and contour.

Improved aeration compared to the prior, with the previous mild
nodularity not as prominent on the current plain film. No increasing
nodularity.

No pneumothorax or pleural effusion. No new confluent airspace
disease.

Degenerative changes spine.  No acute displaced fracture
IMPRESSION: Improved aeration compared to the prior, with the previous
nodularity not as prominent as the comparison.

## 2023-07-13 DIAGNOSIS — G4733 Obstructive sleep apnea (adult) (pediatric): Secondary | ICD-10-CM | POA: Diagnosis not present

## 2023-07-18 ENCOUNTER — Encounter (HOSPITAL_COMMUNITY): Payer: Self-pay

## 2023-07-25 ENCOUNTER — Encounter: Payer: Self-pay | Admitting: Family

## 2023-07-25 ENCOUNTER — Ambulatory Visit: Payer: Medicaid Other | Admitting: Family

## 2023-07-25 VITALS — BP 112/63 | HR 55 | Temp 97.8°F | Ht 65.0 in | Wt 245.0 lb

## 2023-07-25 DIAGNOSIS — F411 Generalized anxiety disorder: Secondary | ICD-10-CM

## 2023-07-25 DIAGNOSIS — I480 Paroxysmal atrial fibrillation: Secondary | ICD-10-CM | POA: Diagnosis not present

## 2023-07-25 DIAGNOSIS — K5900 Constipation, unspecified: Secondary | ICD-10-CM

## 2023-07-25 DIAGNOSIS — E1169 Type 2 diabetes mellitus with other specified complication: Secondary | ICD-10-CM

## 2023-07-25 DIAGNOSIS — K219 Gastro-esophageal reflux disease without esophagitis: Secondary | ICD-10-CM

## 2023-07-25 DIAGNOSIS — Z6841 Body Mass Index (BMI) 40.0 and over, adult: Secondary | ICD-10-CM

## 2023-07-25 DIAGNOSIS — G4733 Obstructive sleep apnea (adult) (pediatric): Secondary | ICD-10-CM

## 2023-07-25 LAB — BAYER DCA HB A1C WAIVED: HB A1C (BAYER DCA - WAIVED): 5.9 % — ABNORMAL HIGH (ref 4.8–5.6)

## 2023-07-25 MED ORDER — SEMAGLUTIDE(0.25 OR 0.5MG/DOS) 2 MG/3ML ~~LOC~~ SOPN: 0.5000 mg | PEN_INJECTOR | SUBCUTANEOUS | 2 refills | Status: DC

## 2023-07-25 NOTE — Patient Instructions (Signed)

## 2023-07-25 NOTE — Progress Notes (Signed)
 Subjective:    Patient ID: Maria Winters, female    DOB: 1960-06-22, 63 y.o.   MRN: [id number]  Chief Complaint  Patient presents with   Medical Management of Chronic Issues   Pt calls the office today for chronic follow up.   She is following a Cardiologists for A Fib, and intermittent palpitations. She is taking Eliquis  BID.    She is followed by Rheumatologists every 3 months for RA.     She has OSA and uses CPAP nightly.   She is taking Ozempic . Her starting weight was 305 lb. She has lost 30 lbs.      07/25/2023   12:02 PM 05/13/2023   11:24 AM 05/04/2023   11:04 AM  Last 3 Weights  Weight (lbs) 245 lb 253 lb 3.2 oz 255 lb  Weight (kg) 111.131 kg 114.851 kg 115.667 kg     Gastroesophageal Reflux She complains of belching and heartburn. This is a chronic problem. The current episode started more than 1 year ago. The problem occurs occasionally. Risk factors include obesity. She has tried an antacid for the symptoms. The treatment provided moderate relief.  Diabetes She presents for her follow-up diabetic visit. She has type 2 diabetes mellitus. Hypoglycemia symptoms include nervousness/anxiousness. Pertinent negatives for diabetes include no blurred vision and no foot paresthesias. Symptoms are stable. Risk factors for coronary artery disease include dyslipidemia, diabetes mellitus, hypertension and sedentary lifestyle. She is following a generally healthy diet. Her overall blood glucose range is 110-130 mg/dl. Eye exam is current.  Constipation This is a chronic problem. The current episode started more than 1 year ago. Her stool frequency is 1 time per day. Risk factors include obesity. She has tried laxatives for the symptoms. The treatment provided moderate relief.  Anxiety Presents for follow-up visit. Symptoms include excessive worry, nervous/anxious behavior and restlessness. Symptoms occur rarely. The severity of symptoms is moderate.       Review of Systems   Eyes:  Negative for blurred vision.  Gastrointestinal:  Positive for constipation and heartburn.  Psychiatric/Behavioral:  The patient is nervous/anxious.   All other systems reviewed and are negative.  Family History  Problem Relation Age of Onset   Diabetes Mother    Hypertension Father    Healthy Sister    Hypertension Sister    Hypertension Brother    Gout Brother    Heart attack Brother    Breast cancer Sister 42       lumpectomy   Sickle cell trait Brother    Diabetes Maternal Grandmother        with 2 BKA   Stroke Maternal Grandmother    Breast cancer Maternal Aunt    Social History   Socioeconomic History   Marital status: Single    Spouse name: Not on file   Number of children: 0   Years of education: Not on file   Highest education level: Some college, no degree  Occupational History   Not on file  Tobacco Use   Smoking status: Former    Current packs/day: 0.00    Average packs/day: 0.3 packs/day for 10.0 years (2.5 ttl pk-yrs)    Types: Cigarettes    Start date: 06/21/1982    Quit date: 06/20/1992    Years since quitting: 31.1    Passive exposure: Never   Smokeless tobacco: Never  Vaping Use   Vaping status: Never Used  Substance and Sexual Activity   Alcohol use: Never   Drug use: No  Sexual activity: Never    Birth control/protection: Abstinence  Other Topics Concern   Not on file  Social History Narrative   Not on file   Social Drivers of Health   Financial Resource Strain: Low Risk  (05/03/2023)   Overall Financial Resource Strain (CARDIA)    Difficulty of Paying Living Expenses: Not hard at all  Food Insecurity: No Food Insecurity (05/03/2023)   Hunger Vital Sign    Worried About Running Out of Food in the Last Year: Never true    Ran Out of Food in the Last Year: Never true  Transportation Needs: No Transportation Needs (05/03/2023)   PRAPARE - Administrator, Civil Service (Medical): No    Lack of Transportation  (Non-Medical): No  Physical Activity: Sufficiently Active (05/03/2023)   Exercise Vital Sign    Days of Exercise per Week: 6 days    Minutes of Exercise per Session: 30 min  Stress: No Stress Concern Present (05/03/2023)   [employer] of Occupational Health - Occupational Stress Questionnaire    Feeling of Stress : Not at all  Social Connections: Moderately Isolated (05/03/2023)   Social Connection and Isolation Panel [NHANES]    Frequency of Communication with Friends and Family: Once a week    Frequency of Social Gatherings with Friends and Family: More than three times a week    Attends Religious Services: More than 4 times per year    Active Member of [employer] or Organizations: No    Attends Engineer, structural: Not on file    Marital Status: Never married       Objective:   Physical Exam Vitals reviewed.  Constitutional:      General: She is not in acute distress.    Appearance: She is well-developed. She is obese.  HENT:     Head: Normocephalic and atraumatic.     Right Ear: Tympanic membrane normal.     Left Ear: Tympanic membrane normal.  Eyes:     Pupils: Pupils are equal, round, and reactive to light.  Neck:     Thyroid : No thyromegaly.  Cardiovascular:     Rate and Rhythm: Normal rate and regular rhythm.     Heart sounds: Normal heart sounds. No murmur heard. Pulmonary:     Effort: Pulmonary effort is normal. No respiratory distress.     Breath sounds: Normal breath sounds. No wheezing.  Abdominal:     General: Bowel sounds are normal. There is no distension.     Palpations: Abdomen is soft.     Tenderness: There is no abdominal tenderness.  Musculoskeletal:        General: No tenderness. Normal range of motion.     Cervical back: Normal range of motion and neck supple.  Skin:    General: Skin is warm and dry.  Neurological:     Mental Status: She is alert and oriented to person, place, and time.     Cranial Nerves: No cranial nerve deficit.      Deep Tendon Reflexes: Reflexes are normal and symmetric.  Psychiatric:        Behavior: Behavior normal.        Thought Content: Thought content normal.        Judgment: Judgment normal.    BP 112/63   Pulse (!) 55   Temp 97.8 F (36.6 C) (Temporal)   Ht 5\' 5"  (1.651 m)   Wt 245 lb (111.1 kg)   LMP 03/06/2021 (Exact Date)  SpO2 95%   BMI 40.77 kg/m        Assessment & Plan:  Maria Winters comes in today with chief complaint of Medical Management of Chronic Issues   Diagnosis and orders addressed:  1. Gastroesophageal reflux disease without esophagitis - CMP14+EGFR  2. Type 2 diabetes mellitus with other specified complication, without long-term current use of insulin (HCC) (Primary) - Microalbumin / creatinine urine ratio - CMP14+EGFR - Bayer DCA Hb A1c Waived - Semaglutide ,0.25 or 0.5MG /DOS, 2 MG/3ML SOPN; Inject 0.5 mg into the skin once a week.  Dispense: 3 mL; Refill: 2  3. Anxiety state - CMP14+EGFR  4. Constipation, unspecified constipation type - CMP14+EGFR  5. Morbid obesity (HCC)  - CMP14+EGFR  6. Paroxysmal atrial fibrillation (HCC) - CMP14+EGFR  7. OSA (obstructive sleep apnea) - CMP14+EGFR   Labs pending Continue current medications  Health Maintenance reviewed Diet and exercise encouraged  Follow up plan: 6 months    Tommas Fragmin, FNP

## 2023-07-26 ENCOUNTER — Ambulatory Visit: Payer: Self-pay | Admitting: Family

## 2023-07-26 LAB — CMP14+EGFR
ALT: 36 IU/L — ABNORMAL HIGH (ref 0–32)
AST: 32 IU/L (ref 0–40)
Albumin: 3.7 g/dL — ABNORMAL LOW (ref 3.9–4.9)
Alkaline Phosphatase: 95 IU/L (ref 44–121)
BUN/Creatinine Ratio: 10 — ABNORMAL LOW (ref 12–28)
BUN: 7 mg/dL — ABNORMAL LOW (ref 8–27)
Bilirubin Total: 0.3 mg/dL (ref 0.0–1.2)
CO2: 23 mmol/L (ref 20–29)
Calcium: 9 mg/dL (ref 8.7–10.3)
Chloride: 100 mmol/L (ref 96–106)
Creatinine, Ser: 0.7 mg/dL (ref 0.57–1.00)
Globulin, Total: 2.3 g/dL (ref 1.5–4.5)
Glucose: 86 mg/dL (ref 70–99)
Potassium: 4.1 mmol/L (ref 3.5–5.2)
Sodium: 138 mmol/L (ref 134–144)
Total Protein: 6 g/dL (ref 6.0–8.5)
eGFR: 97 mL/min/{1.73_m2} (ref >59)

## 2023-07-26 LAB — MICROALBUMIN / CREATININE URINE RATIO
Creatinine, Urine: 86.1 mg/dL
Microalb/Creat Ratio: <3 mg/g{creat} (ref 0–29)
Microalbumin, Urine: <3 ug/mL

## 2023-07-28 NOTE — Progress Notes (Addendum)
 Office Visit Note  Patient: Maria Winters             Date of Birth: Nov 06, 1960           MRN: [id number]             PCP: Lavell Bari LABOR, FNP Referring: Lavell Bari LABOR, FNP Visit Date: 08/11/2023   Subjective:  Follow-up (Patient states she is having more bilateral hip pain from sitting or riding in a car for 10-12 minutes. Patient states recently she has what felt like a tooth infection is the back and could not bite on it and did not know if it was inflammation in the gums. )    Discussed the use of AI scribe software for clinical note transcription with the patient, who gave verbal consent to proceed.  History of Present Illness   Maria Winters is a 63 y.o. female here for follow up for seropositive RA on methotrexate  25 mg p.o. weekly and on folic acid  1 mg daily.    She experiences stiffness and pain mostly in the hips, particularly after sitting for ten to fifteen minutes, whether at home or while driving. The pain is located on the side of the hips and improves with standing or walking. It is not constant, and she has good days as well. The pain does not radiate down the leg, and there is no numbness or tingling. The pain is more pronounced when crossing her legs or lifting her leg in the shower.  She has a history of TMJ episodes, although none have occurred in the past one to two months. She recalls an episode three weeks ago where biting down on a tooth was extremely painful, despite the tooth being healthy. She wonders if this could be related to inflammation from her arthritis.  She has a history of gingivitis and periodontal disease, with pocketing noted during her last dental visit nearly two years ago. She was prescribed a gel and a mouthwash for gum pain, which she has not needed to use recently. She questions whether methotrexate , which she is currently taking, has helped alleviate her gum issues, as she has not experienced gum pain recently.  She is  concerned about a recent lab result showing a liver enzyme slightly outside the normal range given use of methotrexate . She does not drink any alcohol.  She is actively trying to manage her weight by walking for thirty minutes daily, which she feels helps prevent weight gain.   Addendum 09/13/23: Ms. Riviere also updated us  about mobility limitation related to arthritis. She had to purchase crutches because patient cannot bear weight when she has inflammation due to OA and has used them intermittently since November 2024. Patient has particular difficulty getting to the bathroom during a flare.    Previous HPI 02/01/2023 Maria Winters is a 63 y.o. female here for follow up for seropositive RA on methotrexate  increased to 25 mg p.o. weekly since last visit and on folic acid  1 mg daily.  Overall symptoms have been doing worse recently.  Especially has been experiencing increased pain in her hips and at her ankles in the past few weeks.  This is bad enough that she cannot bear weight on her left foot a couple occasions during the past 4 days.  She is also noticed pain and swelling in the fingers usually affecting 2 or 3 fingers at a time.  She did not notice any change in activity no new illness or injury.  The cold weather does seem to at least somewhat aggravate symptoms.  She has noticed some generalized hair thinning wonders if this is related to the medication.   Previous HPI 11/01/2022 Maria Winters is a 63 y.o. female here for follow up for seropositive RA on methotrexate  increased to 25 mg p.o. weekly since last visit and on folic acid  1 mg daily.  Since her last visit she had a few flareups of increased symptoms with bilateral hand shoulder and feet pain.  Also associated with episode of TMJ and these were acting up worse in June or early July she thinks associated with weather changes.  Since then come back down currently does not have any prolonged morning stiffness or joint swelling.   Last follow-up with her eye doctor they reported there was some inflammation thought to be related with her rheumatoid arthritis.  She was prescribed Restasis for this to use in addition to artificial tears.   Previous HPI 07/28/2022 MAREA Winters is a 63 y.o. female here for follow up for seropositive RA on methotrexate  increased to 25 mg p.o. weekly since last visit and on folic acid  1 mg daily.  She had several events since her last visit she officially retired and so had a transition in insurance coverage which delayed her scheduling this follow-up appointment.  She was sick with upper respiratory infection symptoms and had a positive COVID test in February took molnupiravir  for this.  She is now on semaglutide  injection and has worked on cutting out sweets and caffeine from her diet as well as consistent daily exercise and nighttime use of CPAP.  Overall she feels very well with less fatigue and inflammation than she has had in the past several years.   Previous HPI 03/31/22 Maria Winters is a 63 y.o. female here for follow up of rheumatoid arthritis on methotrexate  20 mg p.o. weekly and folic acid  1 mg daily.  She has increased problems since a flareup started in her right foot on January 9.  She experiences migratory joint inflammation mostly in 1 joint at a time starting in the right foot proceeded to eventually involve both feet both hands and both shoulders each spot lasting for about 2 or 3 days at a time.  Currently symptoms are improved again without any swollen area.  She did not recall any kind of illness injury or change in activity prior to this episode.  She has not been taking any specific medication for but is the spending all weekend resting and elevating her feet but symptoms worsening and during the week while having to be more active.   Previous HPI 01/25/22 Maria Winters is a 63 y.o. female here for follow up for seropositive RA  on MTX 15 mg PO weekly and folic acid  1  mg daily.  She has been mostly doing well since her last visit without significant daily peripheral joint pains or swelling.  Does get pain in her knees somewhat limiting for prolonged walking standing and low back pains in a fixed seated position.  She is noticing pain in her feet on both sides most often after driving.  Had some episodic increase in pain at her hands and elbows improving on their own within about 2 weeks.  Also recently went to the emergency department a few weeks ago due to onset of left-sided jaw pain.  Evaluation was reassuring against cardiac etiology thought to be temporomandibular joint pain.  She completed a steroid taper and low-dose hydrocodone   symptoms have resolved.   Previous HPI 10/20/2021 KARLYNN FURROW is a 63 y.o. female here for follow up for seropositive RA on methotrexate  15 mg p.o. weekly.  She seen a sizable improvement in much of her joint symptoms and has not experienced any trouble taking the medication.  She is having some pain in the right hip most often occurred when sitting in 1 position for prolonged time usually about 30 minutes or longer provokes this.  Otherwise joint problems are mostly doing better and she is not seeing any ongoing swelling changes.   Previous HPI  06/22/2021 LAINI URICK is a 63 y.o. female here for follow up for seropositive RA after starting methotrexate  15 mg PO weekly. Also increase vitamin D  supplementation to 5000 units daily. She misunderstood instructions so started taking methotrexate  only 1 tablet weekly for 2 weeks then increased to 6 tablets as planned for the past 2 weeks. She has some diarrhea with the medication but not severe or lasting continuously. She had increased in bilateral heel pain worst when getting up to walk initially, the left side has improved but right side ongoing symptoms. Also now for about 3 days has pain in her left side around the flank or middle and low back. This radiates around to the front.  No dysuria, frequency, or hematuria reported. She does not recall any injury. No history of kidney stones or kidney infections.   Previous HPI 05/07/21 RYLLIE NIELAND is a 63 y.o. female here for follow up for new seropositive RA labs at initial visit negative screening for hepatitis and TB her CCP is highly positive and vitamin D  low at 15. Xrays showed mild osteoarthritis in the feet otherwise unremarkable. She continues having significant joint pain symptoms affected hands and feet and fatigue. She notices more difficulty performing tasks at work such as pulling drawer handles or turning door knobs with pain or poor grip strength.   Previous HPI 04/21/21 LAVETA GILKEY is a 63 y.o. female here for evaluation of joint pain in multiple areas with elevated RF and sed rate. She reports joint pain starting since about a month ago. Pain is affecting many areas including shoulders, elbows, wrists, fingers, hips, knees, and ankles. She has some night time awakenings due to hand pain. Decreased grip strength is affecting her daily activities including typing and driving. Swelling is most apparent in her hands and on the top of her feet around the base of the toes. She is stiff for about 3 hours each morning before getting some improvement in her mobility. She is not able to take NSAIDs due to anticoagulation for Afib she takes tylenol  which helps but has to repeat this about every 8 hours for symptoms returning.   Labs reviewed 03/2021 RF 494 ESR 70   Review of Systems  Constitutional:  Negative for fatigue.  HENT:  Negative for mouth sores and mouth dryness.   Eyes:  Positive for dryness.  Respiratory:  Negative for shortness of breath.   Cardiovascular:  Negative for chest pain and palpitations.  Gastrointestinal:  Negative for blood in stool, constipation and diarrhea.  Endocrine: Negative for increased urination.  Genitourinary:  Negative for involuntary urination.  Musculoskeletal:   Positive for joint pain, joint pain, joint swelling and morning stiffness. Negative for gait problem, myalgias, muscle weakness, muscle tenderness and myalgias.  Skin:  Positive for sensitivity to sunlight. Negative for color change, rash and hair loss.  Allergic/Immunologic: Negative for susceptible to infections.  Neurological:  Negative for dizziness and headaches.  Hematological:  Negative for swollen glands.  Psychiatric/Behavioral:  Positive for sleep disturbance. Negative for depressed mood. The patient is not nervous/anxious.     PMFS History:  Patient Active Problem List   Diagnosis Date Noted   Bilateral hip pain 08/11/2023   OSA (obstructive sleep apnea) 11/27/2021   Morbid obesity (HCC) 05/11/2021   Paroxysmal atrial fibrillation (HCC) 05/11/2021   Intermittent palpitations 05/11/2021   Excessive daytime sleepiness 05/11/2021   At risk for central sleep apnea 05/11/2021   Seropositive rheumatoid arthritis (HCC) 04/21/2021   Vitamin D  deficiency 04/21/2021   High risk medication use 04/21/2021   Myalgia 03/20/2021   Vertigo 07/23/2019   Constipation 07/23/2019   Anxiety state 03/18/2016   Migraine equivalent 03/18/2016   Type 2 diabetes mellitus with other specified complication (HCC) 06/06/2015   S/P lap cholecystectomy July 2014 09/14/2012   GERD (gastroesophageal reflux disease) 04/28/2012    Past Medical History:  Diagnosis Date   Abnormal Pap smear of cervix    in her 20's-- hx of conization/LEEP procedure-normal paps since   Abnormal uterine bleeding    Anemia    ~2005   Arthritis    Atrial fibrillation (HCC)    Chest pain    Fibroid    GERD (gastroesophageal reflux disease)    Helicobacter pylori (H. pylori)    Psoriasis    Reflux    STD (sexually transmitted disease) 1994   Tx'd for Chlamydia   TMJ (temporomandibular joint syndrome)    Vision abnormalities    tunnel vision    Family History  Problem Relation Age of Onset   Diabetes Mother     Hypertension Father    Healthy Sister    Hypertension Sister    Hypertension Brother    Gout Brother    Heart attack Brother    Breast cancer Sister 88       lumpectomy   Sickle cell trait Brother    Diabetes Maternal Grandmother        with 2 BKA   Stroke Maternal Grandmother    Breast cancer Maternal Aunt    Past Surgical History:  Procedure Laterality Date   cervix dilated     CHOLECYSTECTOMY N/A 09/13/2012   Procedure: LAPAROSCOPIC CHOLECYSTECTOMY WITH INTRAOPERATIVE CHOLANGIOGRAM;  Surgeon: Donnice KATHEE Lunger, MD;  Location: [facility];  Service: General;  Laterality: N/A;   DILATION AND CURETTAGE OF UTERUS     DILATION AND CURETTAGE, DIAGNOSTIC / THERAPEUTIC  1989   with Conization   ESOPHAGOGASTRODUODENOSCOPY N/A 05/11/2012   Procedure: ESOPHAGOGASTRODUODENOSCOPY (EGD);  Surgeon: Claudis RAYMOND Rivet, MD;  Location: [facility];  Service: Endoscopy;  Laterality: N/A;  125   LEEP     Social History   Social History Narrative   Not on file   Immunization History  Administered Date(s) Administered   Influenza Split 04/19/2012   Influenza, Seasonal, Injecte, Preservative Fre 04/10/2016   Influenza,inj,Quad PF,6+ Mos 01/24/2015, 12/23/2017, 12/07/2019, 03/20/2021, 01/07/2022   PFIZER(Purple Top)SARS-COV-2 Vaccination 06/16/2019, 07/07/2019, 01/26/2020   Pneumococcal Conjugate-13 08/31/2016   Pneumococcal Polysaccharide-23 12/07/2019   Tdap 08/31/2016   Zoster Recombinant(Shingrix) 03/06/2019, 05/18/2019     Objective: Vital Signs: BP 114/72 (BP Location: Left Arm, Patient Position: Sitting, Cuff Size: Normal)   Pulse (!) 50   Resp 14   Ht 5' 5.5 (1.664 m)   Wt 246 lb (111.6 kg)   LMP 03/06/2021 (Exact Date)   BMI 40.31 kg/m    Physical Exam Constitutional:  Appearance: She is obese.   Eyes:     Conjunctiva/sclera: Conjunctivae normal.    Cardiovascular:     Rate and Rhythm: Normal rate and regular rhythm.  Pulmonary:     Effort: Pulmonary effort is normal.      Breath sounds: Normal breath sounds.   Musculoskeletal:     Right lower leg: No edema.     Left lower leg: No edema.   Skin:    General: Skin is warm and dry.     Findings: No rash.   Neurological:     Mental Status: She is alert.   Psychiatric:        Mood and Affect: Mood normal.      Musculoskeletal Exam:  Shoulders full ROM no tenderness or swelling Elbows full ROM no tenderness or swelling Wrists full ROM no tenderness or swelling Fingers full ROM, no focal tenderness no palpable synovitis Lateral hip tenderness to pressure on both sides, stiffness in lateral hips limiting ROm without hard endpoint Knees full ROM no tenderness or swelling Left ankle pain palpation and range of motion    Investigation: No additional findings.  Imaging: No results found.  Recent Labs: Lab Results  Component Value Date   WBC 5.4 08/11/2023   HGB 11.5 (L) 08/11/2023   PLT 358 08/11/2023   NA 139 08/11/2023   K 4.5 08/11/2023   CL 105 08/11/2023   CO2 30 08/11/2023   GLUCOSE 89 08/11/2023   BUN 11 08/11/2023   CREATININE 0.69 08/11/2023   BILITOT 0.3 08/11/2023   ALKPHOS 95 07/25/2023   AST 12 08/11/2023   ALT 14 08/11/2023   PROT 6.3 08/11/2023   ALBUMIN 3.7 (L) 07/25/2023   CALCIUM  9.0 08/11/2023   GFRAA 83 08/24/2019   QFTBGOLDPLUS NEGATIVE 02/01/2023    Speciality Comments: Labs not drawn 5/22 due to currently transitioning insurance, plans to have collected at local [facility] once she has the card  Procedures:  No procedures performed Allergies: Esomeprazole magnesium   Assessment / Plan:     Visit Diagnoses: Seropositive rheumatoid arthritis (HCC) - Plan: Sedimentation rate Rheumatoid arthritis with TMJ episodes. Improvement in gum pain possibly linked to methotrexate . Discussed RA and periodontal disease association. - Continue methotrexate  25 mg PO weekly and folic acid  1 mg daily. - Checking sed rate for disease activity monitoring - Monitor for new symptoms  or flare-ups.  High risk medication use - methotrexate  25 mg p.o. weekly folic acid  1 mg daily. - Plan: CBC with Differential/Platelet, Comprehensive metabolic panel with GFR Slight elevation in liver enzymes, likely due to methotrexate  or non-alcoholic fatty liver disease. Not significant enough to alter treatment.  No particular medication intolerance reported. - Checking CBC and CMP for medication monitoring on continued long-term use of methotrexate . - If LFTs are remained elevated or trending worse may need to decrease methotrexate  dose  Bilateral hip pain Hip muscle tightness Intermittent lateral hip muscle tightness due to adductor and extension muscle tightness, worsened by sitting. - Recommend hip stretching exercises targeting adductors and extension muscles, such as figure four stretch or lying on the side and letting the leg hang forward.    Orders: Orders Placed This Encounter  Procedures   Sedimentation rate   CBC with Differential/Platelet   Comprehensive metabolic panel with GFR   No orders of the defined types were placed in this encounter.    Follow-Up Instructions: Return in about 3 months (around 11/11/2023) for RA on MTX f/u 3mos.   Lonni LELON Ester, MD  Note - This record has been created using [employer].  Chart creation errors have been sought, but may not always  have been located. Such creation errors do not reflect on  the standard of medical care.

## 2023-08-11 ENCOUNTER — Encounter: Payer: Self-pay | Admitting: Internal Medicine

## 2023-08-11 ENCOUNTER — Ambulatory Visit: Attending: Internal Medicine | Admitting: Internal Medicine

## 2023-08-11 VITALS — BP 114/72 | HR 50 | Resp 14 | Ht 65.5 in | Wt 246.0 lb

## 2023-08-11 DIAGNOSIS — Z79899 Other long term (current) drug therapy: Secondary | ICD-10-CM | POA: Insufficient documentation

## 2023-08-11 DIAGNOSIS — M25551 Pain in right hip: Secondary | ICD-10-CM | POA: Insufficient documentation

## 2023-08-11 DIAGNOSIS — M25552 Pain in left hip: Secondary | ICD-10-CM | POA: Insufficient documentation

## 2023-08-11 DIAGNOSIS — M059 Rheumatoid arthritis with rheumatoid factor, unspecified: Secondary | ICD-10-CM | POA: Diagnosis not present

## 2023-08-12 LAB — CBC WITH DIFFERENTIAL/PLATELET
Absolute Lymphocytes: 1555 {cells}/uL (ref 850–3900)
Absolute Monocytes: 572 {cells}/uL (ref 200–950)
Basophils Absolute: 70 {cells}/uL (ref 0–200)
Basophils Relative: 1.3 %
Eosinophils Absolute: 189 {cells}/uL (ref 15–500)
Eosinophils Relative: 3.5 %
HCT: 36.5 % (ref 35.0–45.0)
Hemoglobin: 11.5 g/dL — ABNORMAL LOW (ref 11.7–15.5)
MCH: 28 pg (ref 27.0–33.0)
MCHC: 31.5 g/dL — ABNORMAL LOW (ref 32.0–36.0)
MCV: 88.8 fL (ref 80.0–100.0)
MPV: 9.2 fL (ref 7.5–12.5)
Monocytes Relative: 10.6 %
Neutro Abs: 3013 {cells}/uL (ref 1500–7800)
Neutrophils Relative %: 55.8 %
Platelets: 358 10*3/uL (ref 140–400)
RBC: 4.11 10*6/uL (ref 3.80–5.10)
RDW: 14.4 % (ref 11.0–15.0)
Total Lymphocyte: 28.8 %
WBC: 5.4 10*3/uL (ref 3.8–10.8)

## 2023-08-12 LAB — COMPREHENSIVE METABOLIC PANEL WITH GFR
AG Ratio: 1.3 (calc) (ref 1.0–2.5)
ALT: 14 U/L (ref 6–29)
AST: 12 U/L (ref 10–35)
Albumin: 3.5 g/dL — ABNORMAL LOW (ref 3.6–5.1)
Alkaline phosphatase (APISO): 77 U/L (ref 37–153)
BUN: 11 mg/dL (ref 7–25)
CO2: 30 mmol/L (ref 20–32)
Calcium: 9 mg/dL (ref 8.6–10.4)
Chloride: 105 mmol/L (ref 98–110)
Creat: 0.69 mg/dL (ref 0.50–1.05)
Globulin: 2.8 g/dL (ref 1.9–3.7)
Glucose, Bld: 89 mg/dL (ref 65–99)
Potassium: 4.5 mmol/L (ref 3.5–5.3)
Sodium: 139 mmol/L (ref 135–146)
Total Bilirubin: 0.3 mg/dL (ref 0.2–1.2)
Total Protein: 6.3 g/dL (ref 6.1–8.1)
eGFR: 97 mL/min/{1.73_m2} (ref >=60)

## 2023-08-12 LAB — SEDIMENTATION RATE: Sed Rate: 39 mm/h — ABNORMAL HIGH (ref 0–30)

## 2023-08-16 ENCOUNTER — Other Ambulatory Visit: Payer: Self-pay | Admitting: Cardiology

## 2023-08-25 MED ORDER — METHOTREXATE SODIUM 2.5 MG PO TABS: 25.0000 mg | ORAL_TABLET | ORAL | 0 refills | Status: DC

## 2023-08-25 MED ORDER — FOLIC ACID 1 MG PO TABS
1.0000 mg | ORAL_TABLET | Freq: Every day | ORAL | 3 refills | Status: DC
Start: 2023-08-25 — End: 2024-01-30

## 2023-09-05 ENCOUNTER — Telehealth: Payer: Self-pay | Admitting: *Deleted

## 2023-09-05 NOTE — Telephone Encounter (Signed)
 Patient wants you to add an addendum to your last office note  that patient had to purchase crutches because patient cannot bear weight when she has inflammation due to OA, purchased 01/29/2023. Patient has difficulty getting to the bathroom during a flare.

## 2023-09-07 NOTE — Telephone Encounter (Signed)
 Patient called the office again today to see if the addendum can be added to her file, since she has a disability hearing coming up soon. Please advise.

## 2023-09-13 NOTE — Telephone Encounter (Signed)
 Patient called the office again to see if there could be an addendum added to her MyChart. Purchased crutches in 2024 and this would be helpful for her disability hearing. Please advise

## 2023-09-19 ENCOUNTER — Encounter: Payer: Self-pay | Admitting: *Deleted

## 2023-09-20 ENCOUNTER — Telehealth: Payer: Medicaid Other | Admitting: Adult Health

## 2023-09-20 DIAGNOSIS — G4733 Obstructive sleep apnea (adult) (pediatric): Secondary | ICD-10-CM

## 2023-09-20 NOTE — Patient Instructions (Signed)
 Continue using CPAP nightly and greater than 4 hours each night Mask refitting ordered If your symptoms worsen or you develop new symptoms please let us know.

## 2023-09-20 NOTE — Progress Notes (Signed)
 PATIENT: Maria Winters DOB: [birth year]  REASON FOR VISIT: follow up HISTORY FROM: patient PRIMARY NEUROLOGIST: Dr. Chalice   Virtual Visit via Video Note  I connected with Maria Winters on 09/20/23 at  2:00 PM EDT by a video enabled telemedicine application located remotely at [facility] and verified that I am speaking with the correct person using two identifiers who was located at their own home in [place]   I discussed the limitations of evaluation and management by telemedicine and the availability of in person appointments. The patient expressed understanding and agreed to proceed.   PATIENT: Maria Winters DOB: [birth year]  REASON FOR VISIT: follow up HISTORY FROM: patient    HISTORY OF PRESENT ILLNESS: Today 09/20/23:  Maria Winters is a 63 y.o. female with a history of OSA on CPAP. Returns today for follow-up.  Overall she feels that she is doing well.  Reports that she did call her DME company yesterday because the water  was leaking and she had a dry mouth.  They adjusted her humidification.  She states that she still waking up with a dry mouth.  She does have the nasal mask and uses a chinstrap.  Her download is below      09/21/22: [name] is a 63 y.o. female with a history of OSA on CPAP. Returns today for follow-up. Overall CPAP is working well. Occasionally the mask will come off when she turns. She does change out her supplies regularly.  Download is below       REVIEW OF SYSTEMS: Out of a complete 14 system review of symptoms, the patient complains only of the following symptoms, and all other reviewed systems are negative.  ALLERGIES: Allergies  Allergen Reactions   Esomeprazole Magnesium     Patient states that the Nexium caused tremors    HOME MEDICATIONS: Outpatient Medications Prior to Visit  Medication Sig Dispense Refill   acetaminophen  (TYLENOL ) 325 MG tablet Take 650 mg by mouth every 6 (six)  hours as needed.     apixaban  (ELIQUIS ) 5 MG TABS tablet TAKE 1 TABLET BY MOUTH TWICE A DAY 60 tablet 5   Cholecalciferol (VITAMIN D ) 125 MCG (5000 UT) CAPS Take by mouth.     dexamethasone  0.5 MG/5ML elixir Take 0.5 mg by mouth 4 (four) times daily.     fluocinonide gel (LIDEX) 0.05 % Apply topically 4 (four) times daily.     folic acid  (FOLVITE ) 1 MG tablet Take 1 tablet (1 mg total) by mouth daily. 90 tablet 3   meclizine  (ANTIVERT ) 25 MG tablet Take 1 tablet (25 mg total) by mouth 3 (three) times daily as needed for dizziness. 90 tablet 1   methotrexate  (RHEUMATREX) 2.5 MG tablet Take 10 tablets (25 mg total) by mouth once a week. Caution:Chemotherapy. Protect from light. 120 tablet 0   metoprolol  succinate (TOPROL -XL) 25 MG 24 hr tablet TAKE 1 TABLET (25 MG TOTAL) BY MOUTH DAILY. 90 tablet 3   polyethylene glycol (MIRALAX  / GLYCOLAX ) 17 g packet Take 17 g by mouth daily.     RESTASIS 0.05 % ophthalmic emulsion 1 drop 2 (two) times daily.     Semaglutide ,0.25 or 0.5MG /DOS, 2 MG/3ML SOPN Inject 0.5 mg into the skin once a week. 3 mL 2   No facility-administered medications prior to visit.    PAST MEDICAL HISTORY: Past Medical History:  Diagnosis Date   Abnormal Pap smear of cervix    in her 70's-- hx of  conization/LEEP procedure-normal paps since   Abnormal uterine bleeding    Anemia    ~2005   Arthritis    Atrial fibrillation (HCC)    Chest pain    Fibroid    GERD (gastroesophageal reflux disease)    Helicobacter pylori (H. pylori)    Psoriasis    Reflux    STD (sexually transmitted disease) 1994   Tx'd for Chlamydia   TMJ (temporomandibular joint syndrome)    Vision abnormalities    tunnel vision    PAST SURGICAL HISTORY: Past Surgical History:  Procedure Laterality Date   cervix dilated     CHOLECYSTECTOMY N/A 09/13/2012   Procedure: LAPAROSCOPIC CHOLECYSTECTOMY WITH INTRAOPERATIVE CHOLANGIOGRAM;  Surgeon: Donnice KATHEE Lunger, MD;  Location: [facility];  Service: General;   Laterality: N/A;   DILATION AND CURETTAGE OF UTERUS     DILATION AND CURETTAGE, DIAGNOSTIC / THERAPEUTIC  1989   with Conization   ESOPHAGOGASTRODUODENOSCOPY N/A 05/11/2012   Procedure: ESOPHAGOGASTRODUODENOSCOPY (EGD);  Surgeon: Claudis RAYMOND Rivet, MD;  Location: [facility];  Service: Endoscopy;  Laterality: N/A;  125   LEEP      FAMILY HISTORY: Family History  Problem Relation Age of Onset   Diabetes Mother    Hypertension Father    Healthy Sister    Hypertension Sister    Hypertension Brother    Gout Brother    Heart attack Brother    Breast cancer Sister 33       lumpectomy   Sickle cell trait Brother    Diabetes Maternal Grandmother        with 2 BKA   Stroke Maternal Grandmother    Breast cancer Maternal Aunt     SOCIAL HISTORY: Social History   Socioeconomic History   Marital status: Single    Spouse name: Not on file   Number of children: 0   Years of education: Not on file   Highest education level: Some college, no degree  Occupational History   Not on file  Tobacco Use   Smoking status: Former    Current packs/day: 0.00    Average packs/day: 0.3 packs/day for 10.0 years (2.5 ttl pk-yrs)    Types: Cigarettes    Start date: 06/21/1982    Quit date: 06/20/1992    Years since quitting: 31.2    Passive exposure: Never   Smokeless tobacco: Never  Vaping Use   Vaping status: Never Used  Substance and Sexual Activity   Alcohol use: Never   Drug use: No   Sexual activity: Never    Birth control/protection: Abstinence  Other Topics Concern   Not on file  Social History Narrative   Not on file   Social Drivers of Health   Financial Resource Strain: Low Risk  (05/03/2023)   Overall Financial Resource Strain (CARDIA)    Difficulty of Paying Living Expenses: Not hard at all  Food Insecurity: No Food Insecurity (05/03/2023)   Hunger Vital Sign    Worried About Running Out of Food in the Last Year: Never true    Ran Out of Food in the Last Year: Never true   Transportation Needs: No Transportation Needs (05/03/2023)   PRAPARE - Administrator, Civil Service (Medical): No    Lack of Transportation (Non-Medical): No  Physical Activity: Sufficiently Active (05/03/2023)   Exercise Vital Sign    Days of Exercise per Week: 6 days    Minutes of Exercise per Session: 30 min  Stress: No Stress Concern Present (05/03/2023)  [employer] of Occupational Health - Occupational Stress Questionnaire    Feeling of Stress : Not at all  Social Connections: Moderately Isolated (05/03/2023)   Social Connection and Isolation Panel    Frequency of Communication with Friends and Family: Once a week    Frequency of Social Gatherings with Friends and Family: More than three times a week    Attends Religious Services: More than 4 times per year    Active Member of [employer] or Organizations: No    Attends Engineer, structural: Not on file    Marital Status: Never married  Intimate Partner Violence: Not on file      PHYSICAL EXAM Generalized: Well developed, in no acute distress   Neurological examination  Mentation: Alert oriented to time, place, history taking. Follows all commands speech and language fluent Cranial nerve II-XII: Facial symmetry noted  DIAGNOSTIC DATA (LABS, IMAGING, TESTING) - I reviewed patient records, labs, notes, testing and imaging myself where available.  Lab Results  Component Value Date   WBC 5.4 08/11/2023   HGB 11.5 (L) 08/11/2023   HCT 36.5 08/11/2023   MCV 88.8 08/11/2023   PLT 358 08/11/2023      Component Value Date/Time   NA 139 08/11/2023 1132   NA 138 07/25/2023 1234   K 4.5 08/11/2023 1132   CL 105 08/11/2023 1132   CO2 30 08/11/2023 1132   GLUCOSE 89 08/11/2023 1132   BUN 11 08/11/2023 1132   BUN 7 (L) 07/25/2023 1234   CREATININE 0.69 08/11/2023 1132   CALCIUM  9.0 08/11/2023 1132   PROT 6.3 08/11/2023 1132   PROT 6.0 07/25/2023 1234   ALBUMIN 3.7 (L) 07/25/2023 1234   AST 12  08/11/2023 1132   ALT 14 08/11/2023 1132   ALKPHOS 95 07/25/2023 1234   BILITOT 0.3 08/11/2023 1132   BILITOT 0.3 07/25/2023 1234   GFRNONAA >60 07/02/2020 1442   GFRAA 83 08/24/2019 0806   Lab Results  Component Value Date   CHOL 170 01/07/2022   HDL 92 01/07/2022   LDLCALC 63 01/07/2022   TRIG 79 01/07/2022   CHOLHDL 1.8 01/07/2022   Lab Results  Component Value Date   HGBA1C 5.9 (H) 07/25/2023   Lab Results  Component Value Date   VITAMINB12 380 04/02/2021   Lab Results  Component Value Date   TSH 1.130 01/07/2022      ASSESSMENT AND PLAN 63 y.o. year old female  has a past medical history of Abnormal Pap smear of cervix, Abnormal uterine bleeding, Anemia, Arthritis, Atrial fibrillation (HCC), Chest pain, Fibroid, GERD (gastroesophageal reflux disease), Helicobacter pylori (H. pylori), Psoriasis, Reflux, STD (sexually transmitted disease) (1994), TMJ (temporomandibular joint syndrome), and Vision abnormalities. here with:  OSA on CPAP  CPAP compliance excellent Residual AHI is good Encouraged patient to continue using CPAP nightly and > 4 hours each night Mask refitting ordered F/U in 1 year or sooner if needed   Duwaine Russell, MSN, NP-C 09/20/2023, 2:00 PM [facility] [address] [place], [place] [address] [phone]  The patient's condition requires frequent monitoring and adjustments in the treatment plan, reflecting the ongoing complexity of care.  This provider is the continuing focal point for all needed services for this condition.

## 2023-09-21 ENCOUNTER — Telehealth: Payer: Self-pay | Admitting: *Deleted

## 2023-09-21 NOTE — Telephone Encounter (Signed)
-----   Message from Duwaine Russell sent at 09/20/2023  3:25 PM EDT ----- Order placed for mask refitting

## 2023-09-21 NOTE — Telephone Encounter (Signed)
 Order sent to [facility].

## 2023-09-22 ENCOUNTER — Other Ambulatory Visit: Payer: Self-pay | Admitting: Cardiology

## 2023-09-22 NOTE — Telephone Encounter (Signed)
 Prescription refill request for Eliquis  received. Indication: afib  Last office visit: [place], 05/13/2023 Scr: 0.69, 08/11/2023 Age: 63 yo  Weight: 111.6 kg   Refill sent.

## 2023-09-26 NOTE — Telephone Encounter (Signed)
 [place], Virginia Grim, Clara Herbison L, RN; [place], Ammon Bales; [place], [name] Got It Thank you

## 2023-10-19 NOTE — Telephone Encounter (Signed)
 Note was addended to include this information in the current HPI.

## 2023-10-27 DIAGNOSIS — G4733 Obstructive sleep apnea (adult) (pediatric): Secondary | ICD-10-CM | POA: Diagnosis not present

## 2023-11-04 NOTE — Progress Notes (Deleted)
 Office Visit Note  Patient: Maria Winters             Date of Birth: Sep 02, 1960           MRN: [id number]             PCP: Maria Bari LABOR, FNP Referring: Maria Bari LABOR, FNP Visit Date: 11/17/2023   Subjective:  No chief complaint on file.   History of Present Illness: Maria Winters is a 63 y.o. female here for follow up  for seropositive RA on methotrexate  25 mg p.o. weekly and on folic acid  1 mg daily.     Previous HPI 08/11/2023 Maria Winters is a 63 y.o. female here for follow up for seropositive RA on methotrexate  25 mg p.o. weekly and on folic acid  1 mg daily.     She experiences stiffness and pain mostly in the hips, particularly after sitting for ten to fifteen minutes, whether at home or while driving. The pain is located on the side of the hips and improves with standing or walking. It is not constant, and she has good days as well. The pain does not radiate down the leg, and there is no numbness or tingling. The pain is more pronounced when crossing her legs or lifting her leg in the shower.   She has a history of TMJ episodes, although none have occurred in the past one to two months. She recalls an episode three weeks ago where biting down on a tooth was extremely painful, despite the tooth being healthy. She wonders if this could be related to inflammation from her arthritis.   She has a history of gingivitis and periodontal disease, with pocketing noted during her last dental visit nearly two years ago. She was prescribed a gel and a mouthwash for gum pain, which she has not needed to use recently. She questions whether methotrexate , which she is currently taking, has helped alleviate her gum issues, as she has not experienced gum pain recently.   She is concerned about a recent lab result showing a liver enzyme slightly outside the normal range given use of methotrexate . She does not drink any alcohol.   She is actively trying to manage her weight by  walking for thirty minutes daily, which she feels helps prevent weight gain.     Addendum 09/13/23: Maria Winters also updated us  about mobility limitation related to arthritis. She had to purchase crutches because patient cannot bear weight when she has inflammation due to OA and has used them intermittently since November 2024. Patient has particular difficulty getting to the bathroom during a flare.      Previous HPI 02/01/2023 Maria Winters is a 63 y.o. female here for follow up for seropositive RA on methotrexate  increased to 25 mg p.o. weekly since last visit and on folic acid  1 mg daily.  Overall symptoms have been doing worse recently.  Especially has been experiencing increased pain in her hips and at her ankles in the past few weeks.  This is bad enough that she cannot bear weight on her left foot a couple occasions during the past 4 days.  She is also noticed pain and swelling in the fingers usually affecting 2 or 3 fingers at a time.  She did not notice any change in activity no new illness or injury.  The cold weather does seem to at least somewhat aggravate symptoms.  She has noticed some generalized hair thinning wonders if this is related to  the medication.   Previous HPI 11/01/2022 Maria Winters is a 63 y.o. female here for follow up for seropositive RA on methotrexate  increased to 25 mg p.o. weekly since last visit and on folic acid  1 mg daily.  Since her last visit she had a few flareups of increased symptoms with bilateral hand shoulder and feet pain.  Also associated with episode of TMJ and these were acting up worse in June or early July she thinks associated with weather changes.  Since then come back down currently does not have any prolonged morning stiffness or joint swelling.  Last follow-up with her eye doctor they reported there was some inflammation thought to be related with her rheumatoid arthritis.  She was prescribed Restasis for this to use in addition to  artificial tears.   Previous HPI 07/28/2022 Maria Winters is a 63 y.o. female here for follow up for seropositive RA on methotrexate  increased to 25 mg p.o. weekly since last visit and on folic acid  1 mg daily.  She had several events since her last visit she officially retired and so had a transition in insurance coverage which delayed her scheduling this follow-up appointment.  She was sick with upper respiratory infection symptoms and had a positive COVID test in February took molnupiravir  for this.  She is now on semaglutide  injection and has worked on cutting out sweets and caffeine from her diet as well as consistent daily exercise and nighttime use of CPAP.  Overall she feels very well with less fatigue and inflammation than she has had in the past several years.   Previous HPI 03/31/22 Maria Winters is a 63 y.o. female here for follow up of rheumatoid arthritis on methotrexate  20 mg p.o. weekly and folic acid  1 mg daily.  She has increased problems since a flareup started in her right foot on January 9.  She experiences migratory joint inflammation mostly in 1 joint at a time starting in the right foot proceeded to eventually involve both feet both hands and both shoulders each spot lasting for about 2 or 3 days at a time.  Currently symptoms are improved again without any swollen area.  She did not recall any kind of illness injury or change in activity prior to this episode.  She has not been taking any specific medication for but is the spending all weekend resting and elevating her feet but symptoms worsening and during the week while having to be more active.   Previous HPI 01/25/22 Maria Winters is a 63 y.o. female here for follow up for seropositive RA  on MTX 15 mg PO weekly and folic acid  1 mg daily.  She has been mostly doing well since her last visit without significant daily peripheral joint pains or swelling.  Does get pain in her knees somewhat limiting for prolonged  walking standing and low back pains in a fixed seated position.  She is noticing pain in her feet on both sides most often after driving.  Had some episodic increase in pain at her hands and elbows improving on their own within about 2 weeks.  Also recently went to the emergency department a few weeks ago due to onset of left-sided jaw pain.  Evaluation was reassuring against cardiac etiology thought to be temporomandibular joint pain.  She completed a steroid taper and low-dose hydrocodone  symptoms have resolved.   Previous HPI 10/20/2021 Maria Winters is a 63 y.o. female here for follow up for seropositive RA on methotrexate   15 mg p.o. weekly.  She seen a sizable improvement in much of her joint symptoms and has not experienced any trouble taking the medication.  She is having some pain in the right hip most often occurred when sitting in 1 position for prolonged time usually about 30 minutes or longer provokes this.  Otherwise joint problems are mostly doing better and she is not seeing any ongoing swelling changes.   Previous HPI  06/22/2021 Maria Winters is a 63 y.o. female here for follow up for seropositive RA after starting methotrexate  15 mg PO weekly. Also increase vitamin D  supplementation to 5000 units daily. She misunderstood instructions so started taking methotrexate  only 1 tablet weekly for 2 weeks then increased to 6 tablets as planned for the past 2 weeks. She has some diarrhea with the medication but not severe or lasting continuously. She had increased in bilateral heel pain worst when getting up to walk initially, the left side has improved but right side ongoing symptoms. Also now for about 3 days has pain in her left side around the flank or middle and low back. This radiates around to the front. No dysuria, frequency, or hematuria reported. She does not recall any injury. No history of kidney stones or kidney infections.   Previous HPI 05/07/21 Maria Winters is a 63 y.o.  female here for follow up for new seropositive RA labs at initial visit negative screening for hepatitis and TB her CCP is highly positive and vitamin D  low at 15. Xrays showed mild osteoarthritis in the feet otherwise unremarkable. She continues having significant joint pain symptoms affected hands and feet and fatigue. She notices more difficulty performing tasks at work such as pulling drawer handles or turning door knobs with pain or poor grip strength.   Previous HPI 04/21/21 Maria Winters is a 63 y.o. female here for evaluation of joint pain in multiple areas with elevated RF and sed rate. She reports joint pain starting since about a month ago. Pain is affecting many areas including shoulders, elbows, wrists, fingers, hips, knees, and ankles. She has some night time awakenings due to hand pain. Decreased grip strength is affecting her daily activities including typing and driving. Swelling is most apparent in her hands and on the top of her feet around the base of the toes. She is stiff for about 3 hours each morning before getting some improvement in her mobility. She is not able to take NSAIDs due to anticoagulation for Afib she takes tylenol  which helps but has to repeat this about every 8 hours for symptoms returning.   Labs reviewed 03/2021 RF 494 ESR 70   No Rheumatology ROS completed.   PMFS History:  Patient Active Problem List   Diagnosis Date Noted   Bilateral hip pain 08/11/2023   OSA (obstructive sleep apnea) 11/27/2021   Morbid obesity (HCC) 05/11/2021   Paroxysmal atrial fibrillation (HCC) 05/11/2021   Intermittent palpitations 05/11/2021   Excessive daytime sleepiness 05/11/2021   At risk for central sleep apnea 05/11/2021   Seropositive rheumatoid arthritis (HCC) 04/21/2021   Vitamin D  deficiency 04/21/2021   High risk medication use 04/21/2021   Myalgia 03/20/2021   Vertigo 07/23/2019   Constipation 07/23/2019   Anxiety state 03/18/2016   Migraine equivalent  03/18/2016   Type 2 diabetes mellitus with other specified complication (HCC) 06/06/2015   S/P lap cholecystectomy July 2014 09/14/2012   GERD (gastroesophageal reflux disease) 04/28/2012    Past Medical History:  Diagnosis Date  Abnormal Pap smear of cervix    in her 20's-- hx of conization/LEEP procedure-normal paps since   Abnormal uterine bleeding    Anemia    ~2005   Arthritis    Atrial fibrillation (HCC)    Chest pain    Fibroid    GERD (gastroesophageal reflux disease)    Helicobacter pylori (H. pylori)    Psoriasis    Reflux    STD (sexually transmitted disease) 1994   Tx'd for Chlamydia   TMJ (temporomandibular joint syndrome)    Vision abnormalities    tunnel vision    Family History  Problem Relation Age of Onset   Diabetes Mother    Hypertension Father    Healthy Sister    Hypertension Sister    Hypertension Brother    Gout Brother    Heart attack Brother    Breast cancer Sister 59       lumpectomy   Sickle cell trait Brother    Diabetes Maternal Grandmother        with 2 BKA   Stroke Maternal Grandmother    Breast cancer Maternal Aunt    Past Surgical History:  Procedure Laterality Date   cervix dilated     CHOLECYSTECTOMY N/A 09/13/2012   Procedure: LAPAROSCOPIC CHOLECYSTECTOMY WITH INTRAOPERATIVE CHOLANGIOGRAM;  Surgeon: Donnice KATHEE Lunger, MD;  Location: [facility];  Service: General;  Laterality: N/A;   DILATION AND CURETTAGE OF UTERUS     DILATION AND CURETTAGE, DIAGNOSTIC / THERAPEUTIC  1989   with Conization   ESOPHAGOGASTRODUODENOSCOPY N/A 05/11/2012   Procedure: ESOPHAGOGASTRODUODENOSCOPY (EGD);  Surgeon: Claudis RAYMOND Rivet, MD;  Location: [facility];  Service: Endoscopy;  Laterality: N/A;  125   LEEP     Social History   Social History Narrative   Not on file   Immunization History  Administered Date(s) Administered   Influenza Split 04/19/2012   Influenza, Seasonal, Injecte, Preservative Fre 04/10/2016   Influenza,inj,Quad PF,6+ Mos  01/24/2015, 12/23/2017, 12/07/2019, 03/20/2021, 01/07/2022   PFIZER(Purple Top)SARS-COV-2 Vaccination 06/16/2019, 07/07/2019, 01/26/2020   Pneumococcal Conjugate-13 08/31/2016   Pneumococcal Polysaccharide-23 12/07/2019   Tdap 08/31/2016   Zoster Recombinant(Shingrix) 03/06/2019, 05/18/2019     Objective: Vital Signs: LMP 03/06/2021 (Exact Date)    Physical Exam   Musculoskeletal Exam: ***  CDAI Exam: CDAI Score: -- Patient Global: --; Provider Global: -- Swollen: --; Tender: -- Joint Exam 11/17/2023   No joint exam has been documented for this visit   There is currently no information documented on the homunculus. Go to the Rheumatology activity and complete the homunculus joint exam.  Investigation: No additional findings.  Imaging: No results found.  Recent Labs: Lab Results  Component Value Date   WBC 5.4 08/11/2023   HGB 11.5 (L) 08/11/2023   PLT 358 08/11/2023   NA 139 08/11/2023   K 4.5 08/11/2023   CL 105 08/11/2023   CO2 30 08/11/2023   GLUCOSE 89 08/11/2023   BUN 11 08/11/2023   CREATININE 0.69 08/11/2023   BILITOT 0.3 08/11/2023   ALKPHOS 95 07/25/2023   AST 12 08/11/2023   ALT 14 08/11/2023   PROT 6.3 08/11/2023   ALBUMIN 3.7 (L) 07/25/2023   CALCIUM  9.0 08/11/2023   GFRAA 83 08/24/2019   QFTBGOLDPLUS NEGATIVE 02/01/2023    Speciality Comments: Labs not drawn 5/22 due to currently transitioning insurance, plans to have collected at local [facility] once she has the card  Procedures:  No procedures performed Allergies: Esomeprazole magnesium   Assessment / Plan:  Visit Diagnoses: No diagnosis found.  ***  Orders: No orders of the defined types were placed in this encounter.  No orders of the defined types were placed in this encounter.    Follow-Up Instructions: No follow-ups on file.   Shenia Alan M Isaiyah Feldhaus, CMA  Note - This record has been created using Animal nutritionist.  Chart creation errors have been sought, but may not always   have been located. Such creation errors do not reflect on  the standard of medical care.

## 2023-11-17 ENCOUNTER — Ambulatory Visit: Admitting: Internal Medicine

## 2023-11-17 DIAGNOSIS — Z79899 Other long term (current) drug therapy: Secondary | ICD-10-CM

## 2023-11-17 DIAGNOSIS — M059 Rheumatoid arthritis with rheumatoid factor, unspecified: Secondary | ICD-10-CM

## 2023-11-21 ENCOUNTER — Other Ambulatory Visit: Payer: Self-pay | Admitting: Internal Medicine

## 2023-11-21 ENCOUNTER — Encounter: Payer: Self-pay | Admitting: Cardiology

## 2023-11-21 DIAGNOSIS — M059 Rheumatoid arthritis with rheumatoid factor, unspecified: Secondary | ICD-10-CM

## 2023-11-21 NOTE — Telephone Encounter (Signed)
 Hello Maria Winters,  Maria will need to come in to the office to sign a release to have the records released to you. There is a fee for the records to be printed.

## 2023-11-22 ENCOUNTER — Other Ambulatory Visit: Payer: Self-pay | Admitting: *Deleted

## 2023-11-22 DIAGNOSIS — Z79899 Other long term (current) drug therapy: Secondary | ICD-10-CM

## 2023-11-22 NOTE — Telephone Encounter (Signed)
 Last Fill: 08/25/2023  Labs: 08/11/2023 Albumin 3.5, Hgb 11.5, MCHC 31.5  Next Visit: 01/24/2024  Last Visit: 08/11/2023  DX: Seropositive rheumatoid arthritis   Current Dose per office note 08/11/2023:  methotrexate  25 mg PO weekly   Patient advised she is due to update her lab work. Patient plans on going Thursday to update. Orders released.   Okay to refill Methotrexate ?

## 2023-11-24 DIAGNOSIS — Z79899 Other long term (current) drug therapy: Secondary | ICD-10-CM | POA: Diagnosis not present

## 2023-11-24 LAB — CBC WITH DIFFERENTIAL/PLATELET
Absolute Lymphocytes: 1487 {cells}/uL (ref 850–3900)
Absolute Monocytes: 677 {cells}/uL (ref 200–950)
Basophils Absolute: 34 {cells}/uL (ref 0–200)
Basophils Relative: 0.5 %
Eosinophils Absolute: 94 {cells}/uL (ref 15–500)
Eosinophils Relative: 1.4 %
HCT: 37.6 % (ref 35.0–45.0)
Hemoglobin: 12.3 g/dL (ref 11.7–15.5)
MCH: 28.8 pg (ref 27.0–33.0)
MCHC: 32.7 g/dL (ref 32.0–36.0)
MCV: 88.1 fL (ref 80.0–100.0)
MPV: 9 fL (ref 7.5–12.5)
Monocytes Relative: 10.1 %
Neutro Abs: 4409 {cells}/uL (ref 1500–7800)
Neutrophils Relative %: 65.8 %
Platelets: 330 Thousand/uL (ref 140–400)
RBC: 4.27 Million/uL (ref 3.80–5.10)
RDW: 13 % (ref 11.0–15.0)
Total Lymphocyte: 22.2 %
WBC: 6.7 Thousand/uL (ref 3.8–10.8)

## 2023-11-24 LAB — COMPREHENSIVE METABOLIC PANEL WITH GFR
AG Ratio: 1.2 (calc) (ref 1.0–2.5)
ALT: 13 U/L (ref 6–29)
AST: 16 U/L (ref 10–35)
Albumin: 3.6 g/dL (ref 3.6–5.1)
Alkaline phosphatase (APISO): 71 U/L (ref 37–153)
BUN: 10 mg/dL (ref 7–25)
CO2: 29 mmol/L (ref 20–32)
Calcium: 9.1 mg/dL (ref 8.6–10.4)
Chloride: 103 mmol/L (ref 98–110)
Creat: 0.65 mg/dL (ref 0.50–1.05)
Globulin: 2.9 g/dL (ref 1.9–3.7)
Glucose, Bld: 110 mg/dL — ABNORMAL HIGH (ref 65–99)
Potassium: 4.6 mmol/L (ref 3.5–5.3)
Sodium: 139 mmol/L (ref 135–146)
Total Bilirubin: 0.4 mg/dL (ref 0.2–1.2)
Total Protein: 6.5 g/dL (ref 6.1–8.1)
eGFR: 98 mL/min/1.73m2 (ref >=60)

## 2023-12-21 ENCOUNTER — Encounter (INDEPENDENT_AMBULATORY_CARE_PROVIDER_SITE_OTHER): Payer: Self-pay | Admitting: Gastroenterology

## 2023-12-28 ENCOUNTER — Other Ambulatory Visit: Payer: Self-pay | Admitting: Internal Medicine

## 2023-12-28 DIAGNOSIS — M059 Rheumatoid arthritis with rheumatoid factor, unspecified: Secondary | ICD-10-CM

## 2023-12-28 NOTE — Telephone Encounter (Signed)
 Last Fill: 11/22/2023 (30 day supply)  Labs: 11/24/2023 Glucose 110  Next Visit: 01/24/2024  Last Visit: 08/11/2023  DX: Seropositive rheumatoid arthritis   Current Dose per office note 08/11/2023: methotrexate  25 mg PO weekly   Okay to refill Methotrexate ?

## 2023-12-29 ENCOUNTER — Ambulatory Visit: Payer: Self-pay | Admitting: Internal Medicine

## 2024-01-12 NOTE — Progress Notes (Signed)
 Office Visit Note  Patient: Maria Winters             Date of Birth: 19-Aug-1960           MRN: [id number]             PCP: Lavell Bari LABOR, FNP Referring: Lavell Bari LABOR, FNP Visit Date: 01/24/2024   Subjective:   Discussed the use of AI scribe software for clinical note transcription with the patient, who gave verbal consent to proceed.  History of Present Illness   Maria Winters is a 63 y.o. female here for follow up for seropositive RA on methotrexate  25 mg p.o. weekly and on folic acid  1 mg daily.   She experiences pain primarily in her hips, legs, and shoulders, which she notices seems exacerbated by dietary choices, particularly foods with preservatives and spicy ingredients, as well as changes in weather from hot to cold.  She recently moved apartments on November 1st, which involved lifting and moving, contributing to her soreness. The pain subsided after two to three days.  She continues to walk daily and thinks that helps a little bit. No recent sacral pain is reported and no significant changes or events aside from her recent move.  No swelling in her legs or hands and no pain on the side of her hips.      Previous HPI 08/11/2023 Maria Winters is a 63 y.o. female here for follow up for seropositive RA on methotrexate  25 mg p.o. weekly and on folic acid  1 mg daily.     She experiences stiffness and pain mostly in the hips, particularly after sitting for ten to fifteen minutes, whether at home or while driving. The pain is located on the side of the hips and improves with standing or walking. It is not constant, and she has good days as well. The pain does not radiate down the leg, and there is no numbness or tingling. The pain is more pronounced when crossing her legs or lifting her leg in the shower.   She has a history of TMJ episodes, although none have occurred in the past one to two months. She recalls an episode three weeks ago where biting down on a  tooth was extremely painful, despite the tooth being healthy. She wonders if this could be related to inflammation from her arthritis.   She has a history of gingivitis and periodontal disease, with pocketing noted during her last dental visit nearly two years ago. She was prescribed a gel and a mouthwash for gum pain, which she has not needed to use recently. She questions whether methotrexate , which she is currently taking, has helped alleviate her gum issues, as she has not experienced gum pain recently.   She is concerned about a recent lab result showing a liver enzyme slightly outside the normal range given use of methotrexate . She does not drink any alcohol.   She is actively trying to manage her weight by walking for thirty minutes daily, which she feels helps prevent weight gain.     Addendum 09/13/23: [name] also updated us  about mobility limitation related to arthritis. She had to purchase crutches because patient cannot bear weight when she has inflammation due to OA and has used them intermittently since November 2024. Patient has particular difficulty getting to the bathroom during a flare.      Previous HPI 02/01/2023 Maria Winters is a 63 y.o. female here for follow up for seropositive RA on  methotrexate  increased to 25 mg p.o. weekly since last visit and on folic acid  1 mg daily.  Overall symptoms have been doing worse recently.  Especially has been experiencing increased pain in her hips and at her ankles in the past few weeks.  This is bad enough that she cannot bear weight on her left foot a couple occasions during the past 4 days.  She is also noticed pain and swelling in the fingers usually affecting 2 or 3 fingers at a time.  She did not notice any change in activity no new illness or injury.  The cold weather does seem to at least somewhat aggravate symptoms.  She has noticed some generalized hair thinning wonders if this is related to the medication.   Previous  HPI 11/01/2022 Maria Winters is a 63 y.o. female here for follow up for seropositive RA on methotrexate  increased to 25 mg p.o. weekly since last visit and on folic acid  1 mg daily.  Since her last visit she had a few flareups of increased symptoms with bilateral hand shoulder and feet pain.  Also associated with episode of TMJ and these were acting up worse in June or early July she thinks associated with weather changes.  Since then come back down currently does not have any prolonged morning stiffness or joint swelling.  Last follow-up with her eye doctor they reported there was some inflammation thought to be related with her rheumatoid arthritis.  She was prescribed Restasis for this to use in addition to artificial tears.   Previous HPI 07/28/2022 Maria Winters is a 63 y.o. female here for follow up for seropositive RA on methotrexate  increased to 25 mg p.o. weekly since last visit and on folic acid  1 mg daily.  She had several events since her last visit she officially retired and so had a transition in insurance coverage which delayed her scheduling this follow-up appointment.  She was sick with upper respiratory infection symptoms and had a positive COVID test in February took molnupiravir  for this.  She is now on semaglutide  injection and has worked on cutting out sweets and caffeine from her diet as well as consistent daily exercise and nighttime use of CPAP.  Overall she feels very well with less fatigue and inflammation than she has had in the past several years.   Previous HPI 03/31/22 Maria Winters is a 63 y.o. female here for follow up of rheumatoid arthritis on methotrexate  20 mg p.o. weekly and folic acid  1 mg daily.  She has increased problems since a flareup started in her right foot on January 9.  She experiences migratory joint inflammation mostly in 1 joint at a time starting in the right foot proceeded to eventually involve both feet both hands and both shoulders each spot  lasting for about 2 or 3 days at a time.  Currently symptoms are improved again without any swollen area.  She did not recall any kind of illness injury or change in activity prior to this episode.  She has not been taking any specific medication for but is the spending all weekend resting and elevating her feet but symptoms worsening and during the week while having to be more active.   Previous HPI 01/25/22 Maria Winters is a 63 y.o. female here for follow up for seropositive RA  on MTX 15 mg PO weekly and folic acid  1 mg daily.  She has been mostly doing well since her last visit without significant daily peripheral joint  pains or swelling.  Does get pain in her knees somewhat limiting for prolonged walking standing and low back pains in a fixed seated position.  She is noticing pain in her feet on both sides most often after driving.  Had some episodic increase in pain at her hands and elbows improving on their own within about 2 weeks.  Also recently went to the emergency department a few weeks ago due to onset of left-sided jaw pain.  Evaluation was reassuring against cardiac etiology thought to be temporomandibular joint pain.  She completed a steroid taper and low-dose hydrocodone  symptoms have resolved.   Previous HPI 10/20/2021 Maria Winters is a 63 y.o. female here for follow up for seropositive RA on methotrexate  15 mg p.o. weekly.  She seen a sizable improvement in much of her joint symptoms and has not experienced any trouble taking the medication.  She is having some pain in the right hip most often occurred when sitting in 1 position for prolonged time usually about 30 minutes or longer provokes this.  Otherwise joint problems are mostly doing better and she is not seeing any ongoing swelling changes.   Previous HPI  06/22/2021 Maria Winters is a 63 y.o. female here for follow up for seropositive RA after starting methotrexate  15 mg PO weekly. Also increase vitamin D   supplementation to 5000 units daily. She misunderstood instructions so started taking methotrexate  only 1 tablet weekly for 2 weeks then increased to 6 tablets as planned for the past 2 weeks. She has some diarrhea with the medication but not severe or lasting continuously. She had increased in bilateral heel pain worst when getting up to walk initially, the left side has improved but right side ongoing symptoms. Also now for about 3 days has pain in her left side around the flank or middle and low back. This radiates around to the front. No dysuria, frequency, or hematuria reported. She does not recall any injury. No history of kidney stones or kidney infections.   Previous HPI 05/07/21 Maria Winters is a 63 y.o. female here for follow up for new seropositive RA labs at initial visit negative screening for hepatitis and TB her CCP is highly positive and vitamin D  low at 15. Xrays showed mild osteoarthritis in the feet otherwise unremarkable. She continues having significant joint pain symptoms affected hands and feet and fatigue. She notices more difficulty performing tasks at work such as pulling drawer handles or turning door knobs with pain or poor grip strength.   Previous HPI 04/21/21 Maria Winters is a 63 y.o. female here for evaluation of joint pain in multiple areas with elevated RF and sed rate. She reports joint pain starting since about a month ago. Pain is affecting many areas including shoulders, elbows, wrists, fingers, hips, knees, and ankles. She has some night time awakenings due to hand pain. Decreased grip strength is affecting her daily activities including typing and driving. Swelling is most apparent in her hands and on the top of her feet around the base of the toes. She is stiff for about 3 hours each morning before getting some improvement in her mobility. She is not able to take NSAIDs due to anticoagulation for Afib she takes tylenol  which helps but has to repeat this about  every 8 hours for symptoms returning.   Labs reviewed 03/2021 RF 494 ESR 70     Review of Systems  Constitutional:  Negative for fatigue.  HENT:  Positive for mouth dryness.  Negative for mouth sores.   Eyes:  Positive for dryness.  Respiratory:  Negative for shortness of breath.   Cardiovascular:  Positive for palpitations. Negative for chest pain.  Gastrointestinal:  Positive for blood in stool. Negative for constipation and diarrhea.  Endocrine: Negative for increased urination.  Genitourinary:  Negative for involuntary urination.  Musculoskeletal:  Positive for joint pain and joint pain. Negative for gait problem, joint swelling, myalgias, muscle weakness, morning stiffness, muscle tenderness and myalgias.  Skin:  Positive for hair loss. Negative for color change, rash and sensitivity to sunlight.  Allergic/Immunologic: Negative for susceptible to infections.  Neurological:  Negative for dizziness and headaches.  Hematological:  Negative for swollen glands.  Psychiatric/Behavioral:  Positive for sleep disturbance. Negative for depressed mood. The patient is not nervous/anxious.     PMFS History:  Patient Active Problem List   Diagnosis Date Noted   Bilateral hip pain 08/11/2023   OSA (obstructive sleep apnea) 11/27/2021   Morbid obesity (HCC) 05/11/2021   Paroxysmal atrial fibrillation (HCC) 05/11/2021   Intermittent palpitations 05/11/2021   Excessive daytime sleepiness 05/11/2021   Seropositive rheumatoid arthritis (HCC) 04/21/2021   Vitamin D  deficiency 04/21/2021   High risk medication use 04/21/2021   Myalgia 03/20/2021   Vertigo 07/23/2019   Constipation 07/23/2019   Anxiety state 03/18/2016   Migraine equivalent 03/18/2016   Type 2 diabetes mellitus with other specified complication (HCC) 06/06/2015   S/P lap cholecystectomy July 2014 09/14/2012   GERD (gastroesophageal reflux disease) 04/28/2012    Past Medical History:  Diagnosis Date   Abnormal Pap smear  of cervix    in her 20's-- hx of conization/LEEP procedure-normal paps since   Abnormal uterine bleeding    Allergy    Anemia    ~2005   Arthritis    Atrial fibrillation (HCC)    Chest pain    Diabetes mellitus without complication (HCC)    Fibroid    GERD (gastroesophageal reflux disease)    Heart murmur 2023   Helicobacter pylori (H. pylori)    Psoriasis    Reflux    Sleep apnea 2023   STD (sexually transmitted disease) 1994   Tx'd for Chlamydia   TMJ (temporomandibular joint syndrome)    Vision abnormalities    tunnel vision    Family History  Problem Relation Age of Onset   Diabetes Mother    Hypertension Father    Arthritis Father    Hypertension Sister    Varicose Veins Sister    Hypertension Brother    Gout Brother    Heart attack Brother    Breast cancer Sister 54       lumpectomy   Sickle cell trait Brother    Diabetes Maternal Grandmother        with 2 BKA   Stroke Maternal Grandmother    Breast cancer Maternal Aunt    Cancer Maternal Aunt    Past Surgical History:  Procedure Laterality Date   cervix dilated     CHOLECYSTECTOMY N/A 09/13/2012   Procedure: LAPAROSCOPIC CHOLECYSTECTOMY WITH INTRAOPERATIVE CHOLANGIOGRAM;  Surgeon: Donnice KATHEE Lunger, MD;  Location: [facility];  Service: General;  Laterality: N/A;   DILATION AND CURETTAGE OF UTERUS     DILATION AND CURETTAGE, DIAGNOSTIC / THERAPEUTIC  1989   with Conization   ESOPHAGOGASTRODUODENOSCOPY N/A 05/11/2012   Procedure: ESOPHAGOGASTRODUODENOSCOPY (EGD);  Surgeon: Claudis RAYMOND Rivet, MD;  Location: [facility];  Service: Endoscopy;  Laterality: N/A;  125   LEEP  Social History   Social History Narrative   Not on file   Immunization History  Administered Date(s) Administered   Influenza Split 04/19/2012   Influenza, Seasonal, Injecte, Preservative Fre 04/10/2016, 01/27/2024   Influenza,inj,Quad PF,6+ Mos 01/24/2015, 12/23/2017, 12/07/2019, 03/20/2021, 01/07/2022   PFIZER(Purple Top)SARS-COV-2  Vaccination 06/16/2019, 07/07/2019, 01/26/2020   Pneumococcal Conjugate-13 08/31/2016   Pneumococcal Polysaccharide-23 12/07/2019   Tdap 08/31/2016   Zoster Recombinant(Shingrix) 03/06/2019, 05/18/2019     Objective: Vital Signs: BP (!) 100/54   Pulse 65   Temp (!) 97.3 F (36.3 C)   Resp 16   Ht 5' 5.5 (1.664 m)   Wt 233 lb 3.2 oz (105.8 kg)   LMP 03/06/2021 (Exact Date)   BMI 38.22 kg/m    Physical Exam Constitutional:      Appearance: She is obese.  Eyes:     Conjunctiva/sclera: Conjunctivae normal.  Cardiovascular:     Rate and Rhythm: Normal rate and regular rhythm.  Pulmonary:     Effort: Pulmonary effort is normal.     Breath sounds: Normal breath sounds.  Lymphadenopathy:     Cervical: No cervical adenopathy.  Skin:    General: Skin is warm and dry.  Neurological:     Mental Status: She is alert.  Psychiatric:        Mood and Affect: Mood normal.      Musculoskeletal Exam:  Shoulders full ROM no tenderness or swelling Elbows full ROM no tenderness or swelling Wrists full ROM no tenderness or swelling Fingers full ROM, no focal tenderness no palpable synovitis Lateral hip tenderness to pressure on both sides, stiffness in lateral hips limiting ROM without hard endpoint Knees full ROM no tenderness or swelling   Investigation: No additional findings.  Imaging: No results found.  Recent Labs: Lab Results  Component Value Date   WBC 6.7 11/24/2023   HGB 12.3 11/24/2023   PLT 330 11/24/2023   NA 139 01/27/2024   K 4.3 01/27/2024   CL 103 01/27/2024   CO2 26 01/27/2024   GLUCOSE 81 01/27/2024   BUN 8 01/27/2024   CREATININE 0.96 01/27/2024   BILITOT 0.3 01/27/2024   ALKPHOS 83 01/27/2024   AST 15 01/27/2024   ALT 19 01/27/2024   PROT 6.2 01/27/2024   ALBUMIN 3.7 (L) 01/27/2024   CALCIUM  9.0 01/27/2024   GFRAA 83 08/24/2019   QFTBGOLDPLUS NEGATIVE 02/01/2023    Speciality Comments: Labs not drawn 5/22 due to currently transitioning  insurance, plans to have collected at local [facility] once she has the card  Procedures:  No procedures performed Allergies: Esomeprazole magnesium   Assessment / Plan:     Visit Diagnoses: Seropositive rheumatoid arthritis (HCC) - Plan: Sedimentation rate Symptoms exacerbated by diet and weather. No synovitis on exam today. - Continue methotrexate  25 mg PO weekly and folic acid  1 mg daily. - Checking sed rate for disease activity monitoring  High risk medication use - methotrexate  25 mg p.o. weekly folic acid  1 mg daily - Plan: CBC with Differential/Platelet, Comprehensive metabolic panel with GFR Slight elevation in liver enzymes, likely due to methotrexate  or non-alcoholic fatty liver disease. Not significant enough to alter treatment.  No particular medication intolerance reported. - Checking CBC and CMP for medication monitoring on continued long-term use of methotrexate , future orders for next month placed  Chronic musculoskeletal pain in hips, legs, and shoulders Pain likely related to activity and diet. Intermittent and self-limiting most likely after recent exertion, just monitoring for now.  Orders: Orders Placed This Encounter  Procedures   CBC with Differential/Platelet   Comprehensive metabolic panel with GFR   Sedimentation rate   No orders of the defined types were placed in this encounter.    Follow-Up Instructions: Return in about 4 months (around 05/23/2024) for RA on MTX f/u 4mos.   Lonni LELON Ester, MD  Note - This record has been created using [employer].  Chart creation errors have been sought, but may not always  have been located. Such creation errors do not reflect on  the standard of medical care.

## 2024-01-24 ENCOUNTER — Ambulatory Visit: Attending: Internal Medicine | Admitting: Internal Medicine

## 2024-01-24 ENCOUNTER — Encounter: Payer: Self-pay | Admitting: Internal Medicine

## 2024-01-24 VITALS — BP 100/54 | HR 65 | Temp 97.3°F | Resp 16 | Ht 65.5 in | Wt 233.2 lb

## 2024-01-24 DIAGNOSIS — Z79899 Other long term (current) drug therapy: Secondary | ICD-10-CM | POA: Diagnosis not present

## 2024-01-24 DIAGNOSIS — M25552 Pain in left hip: Secondary | ICD-10-CM | POA: Insufficient documentation

## 2024-01-24 DIAGNOSIS — M059 Rheumatoid arthritis with rheumatoid factor, unspecified: Secondary | ICD-10-CM | POA: Insufficient documentation

## 2024-01-24 DIAGNOSIS — M25551 Pain in right hip: Secondary | ICD-10-CM | POA: Insufficient documentation

## 2024-01-27 ENCOUNTER — Other Ambulatory Visit (HOSPITAL_COMMUNITY)
Admission: RE | Admit: 2024-01-27 | Discharge: 2024-01-27 | Disposition: A | Source: Ambulatory Visit | Attending: Family | Admitting: Family

## 2024-01-27 ENCOUNTER — Encounter: Payer: Self-pay | Admitting: Family

## 2024-01-27 ENCOUNTER — Ambulatory Visit (INDEPENDENT_AMBULATORY_CARE_PROVIDER_SITE_OTHER): Payer: Self-pay | Admitting: Family

## 2024-01-27 VITALS — BP 111/64 | HR 72 | Temp 97.3°F | Ht 65.0 in | Wt 230.0 lb

## 2024-01-27 DIAGNOSIS — M059 Rheumatoid arthritis with rheumatoid factor, unspecified: Secondary | ICD-10-CM

## 2024-01-27 DIAGNOSIS — Z01419 Encounter for gynecological examination (general) (routine) without abnormal findings: Secondary | ICD-10-CM | POA: Insufficient documentation

## 2024-01-27 DIAGNOSIS — E1169 Type 2 diabetes mellitus with other specified complication: Secondary | ICD-10-CM

## 2024-01-27 DIAGNOSIS — Z0001 Encounter for general adult medical examination with abnormal findings: Secondary | ICD-10-CM

## 2024-01-27 DIAGNOSIS — K219 Gastro-esophageal reflux disease without esophagitis: Secondary | ICD-10-CM

## 2024-01-27 DIAGNOSIS — E559 Vitamin D deficiency, unspecified: Secondary | ICD-10-CM | POA: Diagnosis not present

## 2024-01-27 DIAGNOSIS — Z0000 Encounter for general adult medical examination without abnormal findings: Secondary | ICD-10-CM | POA: Diagnosis not present

## 2024-01-27 DIAGNOSIS — F411 Generalized anxiety disorder: Secondary | ICD-10-CM

## 2024-01-27 DIAGNOSIS — Z23 Encounter for immunization: Secondary | ICD-10-CM | POA: Diagnosis not present

## 2024-01-27 DIAGNOSIS — G4733 Obstructive sleep apnea (adult) (pediatric): Secondary | ICD-10-CM

## 2024-01-27 DIAGNOSIS — I480 Paroxysmal atrial fibrillation: Secondary | ICD-10-CM | POA: Diagnosis not present

## 2024-01-27 DIAGNOSIS — Z7985 Long-term (current) use of injectable non-insulin antidiabetic drugs: Secondary | ICD-10-CM | POA: Diagnosis not present

## 2024-01-27 DIAGNOSIS — K5900 Constipation, unspecified: Secondary | ICD-10-CM

## 2024-01-27 LAB — BAYER DCA HB A1C WAIVED: HB A1C (BAYER DCA - WAIVED): 6 % — ABNORMAL HIGH (ref 4.8–5.6)

## 2024-01-27 MED ORDER — SEMAGLUTIDE(0.25 OR 0.5MG/DOS) 2 MG/3ML ~~LOC~~ SOPN: 0.5000 mg | PEN_INJECTOR | SUBCUTANEOUS | 2 refills | Status: AC

## 2024-01-27 NOTE — Patient Instructions (Signed)

## 2024-01-27 NOTE — Addendum Note (Signed)
 Addended by: LAVELL LYE A on: 01/27/2024 10:43 AM   Modules accepted: Level of Service

## 2024-01-27 NOTE — Progress Notes (Signed)
 Subjective:    Patient ID: Maria Winters, female    DOB: Aug 27, 1960, 63 y.o.   MRN: [id number]  Chief Complaint  Patient presents with   Annual Exam   Pt calls the office today for CPE with pap.   She is following a Cardiologists for A Fib, and intermittent palpitations. She is taking Eliquis  BID.    She is followed by Rheumatologists every 3 months for RA.     She has OSA and uses CPAP nightly.   She is taking Ozempic . Her starting weight was 305 lb. She has lost 75 lbs. She is morbid obese with a BMI of 38 with DM.      01/27/2024    9:54 AM 01/24/2024    3:46 PM 08/11/2023   10:45 AM  Last 3 Weights  Weight (lbs) 230 lb 233 lb 3.2 oz 246 lb  Weight (kg) 104.327 kg 105.779 kg 111.585 kg     Gastroesophageal Reflux She complains of belching and heartburn. This is a chronic problem. The current episode started more than 1 year ago. The problem occurs occasionally. The symptoms are aggravated by certain foods. Risk factors include obesity. She has tried an antacid for the symptoms. The treatment provided significant relief.  Diabetes She presents for her follow-up diabetic visit. She has type 2 diabetes mellitus. Hypoglycemia symptoms include nervousness/anxiousness. Pertinent negatives for diabetes include no blurred vision and no foot paresthesias. Symptoms are stable. Risk factors for coronary artery disease include dyslipidemia, diabetes mellitus, hypertension, sedentary lifestyle and obesity. She is following a generally healthy diet. (Does not check glucose at home) Eye exam is current.  Constipation This is a chronic problem. The current episode started more than 1 year ago. Her stool frequency is 1 time per day. Risk factors include obesity. She has tried laxatives and diet changes for the symptoms. The treatment provided moderate relief.  Anxiety Presents for follow-up visit. Symptoms include excessive worry, nervous/anxious behavior and restlessness. Symptoms occur  rarely. The severity of symptoms is moderate.        Review of Systems  Eyes:  Negative for blurred vision.  Gastrointestinal:  Positive for constipation and heartburn.  Psychiatric/Behavioral:  The patient is nervous/anxious.   All other systems reviewed and are negative.  Family History  Problem Relation Age of Onset   Diabetes Mother    Hypertension Father    Arthritis Father    Hypertension Sister    Varicose Veins Sister    Hypertension Brother    Gout Brother    Heart attack Brother    Breast cancer Sister 45       lumpectomy   Sickle cell trait Brother    Diabetes Maternal Grandmother        with 2 BKA   Stroke Maternal Grandmother    Breast cancer Maternal Aunt    Cancer Maternal Aunt    Social History   Socioeconomic History   Marital status: Single    Spouse name: Not on file   Number of children: 0   Years of education: Not on file   Highest education level: GED or equivalent  Occupational History   Not on file  Tobacco Use   Smoking status: Former    Current packs/day: 0.00    Average packs/day: 0.3 packs/day for 10.0 years (2.5 ttl pk-yrs)    Types: Cigarettes    Start date: 06/21/1982    Quit date: 06/20/1992    Years since quitting: 31.6    Passive exposure:  Never   Smokeless tobacco: Never  Vaping Use   Vaping status: Never Used  Substance and Sexual Activity   Alcohol use: Never   Drug use: No   Sexual activity: Never    Birth control/protection: Abstinence  Other Topics Concern   Not on file  Social History Narrative   Not on file   Social Drivers of Health   Financial Resource Strain: Low Risk  (01/23/2024)   Overall Financial Resource Strain (CARDIA)    Difficulty of Paying Living Expenses: Not very hard  Food Insecurity: Food Insecurity Present (01/23/2024)   Hunger Vital Sign    Worried About Running Out of Food in the Last Year: Sometimes true    Ran Out of Food in the Last Year: Sometimes true  Transportation Needs: No  Transportation Needs (01/23/2024)   PRAPARE - Administrator, Civil Service (Medical): No    Lack of Transportation (Non-Medical): No  Physical Activity: Sufficiently Active (01/23/2024)   Exercise Vital Sign    Days of Exercise per Week: 7 days    Minutes of Exercise per Session: 30 min  Stress: No Stress Concern Present (01/23/2024)   [employer] of Occupational Health - Occupational Stress Questionnaire    Feeling of Stress: Only a little  Social Connections: Moderately Integrated (01/23/2024)   Social Connection and Isolation Panel    Frequency of Communication with Friends and Family: Once a week    Frequency of Social Gatherings with Friends and Family: More than three times a week    Attends Religious Services: More than 4 times per year    Active Member of [employer] or Organizations: Yes    Attends Engineer, Structural: More than 4 times per year    Marital Status: Never married       Objective:   Physical Exam Vitals reviewed.  Constitutional:      General: She is not in acute distress.    Appearance: She is well-developed. She is obese.  HENT:     Head: Normocephalic and atraumatic.     Right Ear: Tympanic membrane normal.     Left Ear: Tympanic membrane normal.  Eyes:     Pupils: Pupils are equal, round, and reactive to light.  Neck:     Thyroid : No thyromegaly.  Cardiovascular:     Rate and Rhythm: Normal rate and regular rhythm.     Heart sounds: Normal heart sounds. No murmur heard. Pulmonary:     Effort: Pulmonary effort is normal. No respiratory distress.     Breath sounds: Normal breath sounds. No wheezing.  Abdominal:     General: Bowel sounds are normal. There is no distension.     Palpations: Abdomen is soft.     Tenderness: There is no abdominal tenderness.  Musculoskeletal:        General: No tenderness. Normal range of motion.     Cervical back: Normal range of motion and neck supple.  Skin:    General: Skin is warm and  dry.  Neurological:     Mental Status: She is alert and oriented to person, place, and time.     Cranial Nerves: No cranial nerve deficit.     Deep Tendon Reflexes: Reflexes are normal and symmetric.  Psychiatric:        Behavior: Behavior normal.        Thought Content: Thought content normal.        Judgment: Judgment normal.     BP 111/64  Pulse 72   Temp (!) 97.3 F (36.3 C) (Temporal)   Ht 5' 5 (1.651 m)   Wt 230 lb (104.3 kg)   LMP 03/06/2021 (Exact Date)   BMI 38.27 kg/m        Assessment & Plan:  FELCIA HUEBERT comes in today with chief complaint of Annual Exam   Diagnosis and orders addressed:  1. Type 2 diabetes mellitus with other specified complication, without long-term current use of insulin (HCC) -Continue Ozempic  0.5 mg  Low carb diet  - Semaglutide ,0.25 or 0.5MG /DOS, 2 MG/3ML SOPN; Inject 0.5 mg into the skin once a week.  Dispense: 3 mL; Refill: 2 - Bayer DCA Hb A1c Waived - CMP14+EGFR - TSH - Vitamin B12  2. Encounter for immunization - Flu vaccine trivalent PF, 6mos and older(Flulaval,Afluria,Fluarix,Fluzone) - CMP14+EGFR  3. Vitamin D  deficiency - CMP14+EGFR - VITAMIN D  25 Hydroxy (Vit-D Deficiency, Fractures)  4. Paroxysmal atrial fibrillation (HCC) - CMP14+EGFR  5. OSA (obstructive sleep apnea) - CMP14+EGFR  6. Morbid obesity (HCC) - CMP14+EGFR  7. Gastroesophageal reflux disease without esophagitis - CMP14+EGFR  8. Anxiety state - CMP14+EGFR  9. Constipation, unspecified constipation type - CMP14+EGFR  10. Annual physical exam (Primary) - Bayer DCA Hb A1c Waived - CMP14+EGFR - Lipid panel - TSH - VITAMIN D  25 Hydroxy (Vit-D Deficiency, Fractures) - Vitamin B12  11. Encounter for gynecological examination without abnormal finding - CMP14+EGFR  12. Seropositive rheumatoid arthritis (HCC) - CMP14+EGFR   Labs pending Continue current medications  Health Maintenance reviewed Diet and exercise  encouraged  Follow up plan: 6 months    Bari Learn, FNP

## 2024-01-27 NOTE — Addendum Note (Signed)
 Addended by: MICHELINE ROSINA FALCON on: 01/27/2024 10:41 AM   Modules accepted: Orders

## 2024-01-28 LAB — CMP14+EGFR
ALT: 19 IU/L (ref 0–32)
AST: 15 IU/L (ref 0–40)
Albumin: 3.7 g/dL — ABNORMAL LOW (ref 3.9–4.9)
Alkaline Phosphatase: 83 IU/L (ref 49–135)
BUN/Creatinine Ratio: 8 — ABNORMAL LOW (ref 12–28)
BUN: 8 mg/dL (ref 8–27)
Bilirubin Total: 0.3 mg/dL (ref 0.0–1.2)
CO2: 26 mmol/L (ref 20–29)
Calcium: 9 mg/dL (ref 8.7–10.3)
Chloride: 103 mmol/L (ref 96–106)
Creatinine, Ser: 0.96 mg/dL (ref 0.57–1.00)
Globulin, Total: 2.5 g/dL (ref 1.5–4.5)
Glucose: 81 mg/dL (ref 70–99)
Potassium: 4.3 mmol/L (ref 3.5–5.2)
Sodium: 139 mmol/L (ref 134–144)
Total Protein: 6.2 g/dL (ref 6.0–8.5)
eGFR: 66 mL/min/1.73 (ref >59)

## 2024-01-28 LAB — TSH: TSH: 0.729 u[IU]/mL (ref 0.450–4.500)

## 2024-01-28 LAB — LIPID PANEL
Chol/HDL Ratio: 2 ratio (ref 0.0–4.4)
Cholesterol, Total: 152 mg/dL (ref 100–199)
HDL: 76 mg/dL (ref >39)
LDL Chol Calc (NIH): 67 mg/dL (ref 0–99)
Triglycerides: 37 mg/dL (ref 0–149)
VLDL Cholesterol Cal: 9 mg/dL (ref 5–40)

## 2024-01-28 LAB — VITAMIN D 25 HYDROXY (VIT D DEFICIENCY, FRACTURES): Vit D, 25-Hydroxy: 40 ng/mL (ref 30.0–100.0)

## 2024-01-28 LAB — VITAMIN B12: Vitamin B-12: 449 pg/mL (ref 232–1245)

## 2024-01-29 ENCOUNTER — Other Ambulatory Visit: Payer: Self-pay | Admitting: Internal Medicine

## 2024-01-29 DIAGNOSIS — M059 Rheumatoid arthritis with rheumatoid factor, unspecified: Secondary | ICD-10-CM

## 2024-01-30 ENCOUNTER — Ambulatory Visit: Payer: Self-pay | Admitting: Family

## 2024-01-30 LAB — CYTOLOGY - PAP
Adequacy: ABSENT
Chlamydia: NEGATIVE
Comment: NEGATIVE
Comment: NEGATIVE
Comment: NEGATIVE
Comment: NORMAL
Diagnosis: NEGATIVE
High risk HPV: NEGATIVE
Neisseria Gonorrhea: NEGATIVE
Trichomonas: NEGATIVE

## 2024-01-30 MED ORDER — ATORVASTATIN CALCIUM 20 MG PO TABS: 20.0000 mg | ORAL_TABLET | Freq: Every day | ORAL | 3 refills | Status: AC

## 2024-01-30 NOTE — Telephone Encounter (Signed)
 Last Fill: 08/25/2023  Next Visit: 05/23/2024  Last Visit: 01/24/2024  Dx: Seropositive rheumatoid arthritis   Current Dose per office note on 01/24/2024: folic acid  1 mg daily   Okay to refill Folic Acid ?

## 2024-03-18 ENCOUNTER — Other Ambulatory Visit: Payer: Self-pay | Admitting: Internal Medicine

## 2024-03-18 DIAGNOSIS — M059 Rheumatoid arthritis with rheumatoid factor, unspecified: Secondary | ICD-10-CM

## 2024-03-19 NOTE — Telephone Encounter (Signed)
 Last Fill: 12/29/2023  Labs: 01/27/2024 CMP BUN 8 Albumin 3.7 11/24/2023-CBC  CBC WNL  Next Visit: 05/23/2024  Last Visit: 01/24/2024  DX: Seropositive rheumatoid arthritis   Current Dose per office note 01/24/2024: methotrexate  25 mg p.o. weekly   Okay to refill Methotrexate ?   LMOM for patient to update CBC , provided lab hours

## 2024-03-20 ENCOUNTER — Other Ambulatory Visit: Payer: Self-pay | Admitting: *Deleted

## 2024-03-20 DIAGNOSIS — M059 Rheumatoid arthritis with rheumatoid factor, unspecified: Secondary | ICD-10-CM

## 2024-03-20 DIAGNOSIS — Z79899 Other long term (current) drug therapy: Secondary | ICD-10-CM

## 2024-03-21 LAB — CBC WITH DIFFERENTIAL/PLATELET
Absolute Lymphocytes: 2073 {cells}/uL (ref 850–3900)
Absolute Monocytes: 625 {cells}/uL (ref 200–950)
Basophils Absolute: 50 {cells}/uL (ref 0–200)
Basophils Relative: 0.7 %
Eosinophils Absolute: 178 {cells}/uL (ref 15–500)
Eosinophils Relative: 2.5 %
HCT: 36.1 % (ref 35.9–46.0)
Hemoglobin: 11.4 g/dL — ABNORMAL LOW (ref 11.7–15.5)
MCH: 27.9 pg (ref 27.0–33.0)
MCHC: 31.6 g/dL (ref 31.6–35.4)
MCV: 88.3 fL (ref 81.4–101.7)
MPV: 9 fL (ref 7.5–12.5)
Monocytes Relative: 8.8 %
Neutro Abs: 4175 {cells}/uL (ref 1500–7800)
Neutrophils Relative %: 58.8 %
Platelets: 373 Thousand/uL (ref 140–400)
RBC: 4.09 Million/uL (ref 3.80–5.10)
RDW: 13.5 % (ref 11.0–15.0)
Total Lymphocyte: 29.2 %
WBC: 7.1 Thousand/uL (ref 3.8–10.8)

## 2024-03-22 LAB — COMPREHENSIVE METABOLIC PANEL WITH GFR
AG Ratio: 1.3 (calc) (ref 1.0–2.5)
ALT: 15 U/L (ref 6–29)
AST: 12 U/L (ref 10–35)
Albumin: 3.5 g/dL — ABNORMAL LOW (ref 3.6–5.1)
Alkaline phosphatase (APISO): 94 U/L (ref 37–153)
BUN: 10 mg/dL (ref 7–25)
CO2: 31 mmol/L (ref 20–32)
Calcium: 8.7 mg/dL (ref 8.6–10.4)
Chloride: 103 mmol/L (ref 98–110)
Creat: 0.69 mg/dL (ref 0.50–1.05)
Globulin: 2.8 g/dL (ref 1.9–3.7)
Glucose, Bld: 80 mg/dL (ref 65–139)
Potassium: 4.2 mmol/L (ref 3.5–5.3)
Sodium: 140 mmol/L (ref 135–146)
Total Bilirubin: 0.3 mg/dL (ref 0.2–1.2)
Total Protein: 6.3 g/dL (ref 6.1–8.1)
eGFR: 96 mL/min/1.73m2

## 2024-03-22 LAB — SEDIMENTATION RATE: Sed Rate: 62 mm/h — ABNORMAL HIGH (ref 0–30)

## 2024-05-23 ENCOUNTER — Ambulatory Visit: Admitting: Internal Medicine

## 2024-06-14 ENCOUNTER — Ambulatory Visit: Admitting: Cardiology

## 2024-07-26 ENCOUNTER — Ambulatory Visit: Admitting: Family

## 2024-09-20 ENCOUNTER — Telehealth: Admitting: Adult Health
